# Patient Record
Sex: Male | Born: 1940 | Race: White | Hispanic: No | Marital: Married | Smoking: Former smoker
Health system: Southern US, Community
[De-identification: ages and names within clinical notes are randomized; demographics above are authoritative.]

## PROBLEM LIST (undated history)

## (undated) DIAGNOSIS — C3492 Malignant neoplasm of unspecified part of left bronchus or lung: Secondary | ICD-10-CM

## (undated) DIAGNOSIS — I499 Cardiac arrhythmia, unspecified: Secondary | ICD-10-CM

## (undated) DIAGNOSIS — E039 Hypothyroidism, unspecified: Secondary | ICD-10-CM

## (undated) DIAGNOSIS — F32A Depression, unspecified: Secondary | ICD-10-CM

## (undated) DIAGNOSIS — IMO0002 Reserved for concepts with insufficient information to code with codable children: Secondary | ICD-10-CM

## (undated) DIAGNOSIS — I10 Essential (primary) hypertension: Secondary | ICD-10-CM

## (undated) DIAGNOSIS — F329 Major depressive disorder, single episode, unspecified: Secondary | ICD-10-CM

## (undated) DIAGNOSIS — J90 Pleural effusion, not elsewhere classified: Secondary | ICD-10-CM

## (undated) DIAGNOSIS — F419 Anxiety disorder, unspecified: Secondary | ICD-10-CM

## (undated) HISTORY — DX: Anxiety disorder, unspecified: F41.9

## (undated) HISTORY — DX: Essential (primary) hypertension: I10

## (undated) HISTORY — DX: Malignant neoplasm of unspecified part of left bronchus or lung: C34.92

## (undated) HISTORY — PX: APPENDECTOMY: SHX54

---

## 2007-03-10 ENCOUNTER — Ambulatory Visit: Payer: Self-pay | Admitting: Internal Medicine

## 2007-03-10 DIAGNOSIS — R21 Rash and other nonspecific skin eruption: Secondary | ICD-10-CM

## 2007-03-10 DIAGNOSIS — F329 Major depressive disorder, single episode, unspecified: Secondary | ICD-10-CM

## 2007-03-10 DIAGNOSIS — G4700 Insomnia, unspecified: Secondary | ICD-10-CM

## 2007-05-06 ENCOUNTER — Ambulatory Visit: Payer: Self-pay | Admitting: Internal Medicine

## 2007-05-06 LAB — CONVERTED CEMR LAB
Basophils Absolute: 0.1 10*3/uL (ref 0.0–0.1)
Basophils Relative: 1.6 % — ABNORMAL HIGH (ref 0.0–1.0)
CO2: 29 meq/L (ref 19–32)
Chloride: 104 meq/L (ref 96–112)
Creatinine, Ser: 0.9 mg/dL (ref 0.4–1.5)
Eosinophils Absolute: 0.3 10*3/uL (ref 0.0–0.7)
Eosinophils Relative: 4 % (ref 0.0–5.0)
GFR calc non Af Amer: 90 mL/min
Lymphocytes Relative: 37.3 % (ref 12.0–46.0)
Neutrophils Relative %: 48.3 % (ref 43.0–77.0)
Platelets: 216 10*3/uL (ref 150–400)
Potassium: 4 meq/L (ref 3.5–5.1)
RBC: 4.76 M/uL (ref 4.22–5.81)
TSH: 1.87 microintl units/mL (ref 0.35–5.50)
WBC: 8 10*3/uL (ref 4.5–10.5)

## 2007-05-07 ENCOUNTER — Encounter: Payer: Self-pay | Admitting: Internal Medicine

## 2008-02-01 ENCOUNTER — Telehealth: Payer: Self-pay | Admitting: Internal Medicine

## 2010-03-15 ENCOUNTER — Telehealth: Payer: Self-pay | Admitting: Internal Medicine

## 2010-03-16 ENCOUNTER — Encounter: Payer: Self-pay | Admitting: Internal Medicine

## 2010-03-20 NOTE — Progress Notes (Signed)
Summary: Last ov 2009  Phone Note Refill Request   Refills Requested: Medication #1:  FLUOXETINE HCL 10 MG  CAPS Take 1 capsule by mouth daily REQ RF in [place] - [phone] Fax Req refill for 90 day supply w/3 rfs  Initial call taken by: Lamar Sprinkles, CMA,  March 15, 2010 2:18 PM  Follow-up for Phone Call        OK x6 months  Say Hello and ask him to start seeing a local md Thank you!  Follow-up by: Tresa Garter MD,  March 15, 2010 3:46 PM  Additional Follow-up for Phone Call Additional follow up Details #1::        faxed prescription for pt and informed pt Additional Follow-up by: Ami Bullins CMA,  March 16, 2010 8:27 AM    Prescriptions: FLUOXETINE HCL 10 MG  CAPS (FLUOXETINE HCL) Take 1 capsule by mouth daily  #90 x 3   Entered by:   Ami Bullins CMA   Authorized by:   Tresa Garter MD   Signed by:   Bill Salinas CMA on 03/16/2010   Method used:   Printed then faxed to ...         RxID:   [id number]

## 2010-03-29 NOTE — Medication Information (Signed)
Summary: Faxed to [facility] [place]  [place]axed to [place]   Imported By: Lester [place] 03/19/2010 08:30:27  _____________________________________________________________________  External Attachment:    Type:   Image     Comment:   External Document

## 2011-07-04 ENCOUNTER — Other Ambulatory Visit: Payer: Self-pay | Admitting: Internal Medicine

## 2011-10-25 ENCOUNTER — Other Ambulatory Visit: Payer: Self-pay | Admitting: Internal Medicine

## 2011-10-28 NOTE — Telephone Encounter (Signed)
Ok to Rf? 

## 2013-06-17 ENCOUNTER — Telehealth: Payer: Self-pay | Admitting: Internal Medicine

## 2013-06-17 NOTE — Telephone Encounter (Signed)
Message copied by Derrill Kay on Thu Jun 17, 2013  2:47 PM ------      Message from: Cassandria Anger      Created: Wed Jun 16, 2013  5:25 PM      Regarding: RE: Can patient Re-Establish Care      Contact: [place] to re-est      Thx      ----- Message -----         From: Derrill Kay         Sent: 06/16/2013   3:36 PM           To: Cassandria Anger, MD      Subject: Can patient Re-Establish Care                            Dr. Alain Marion,            Vicente Males is calling in regards to your patient who was last seen 04/2007, Mr. Elishah Ashmore. Vicente Males is his daughter.  Mr. Cropp moved to [place] for a few years and is now back in the area.              Vicente Males wants to know if Mr. Vanatta can re-establish with you although it has been more than 3 years since he was last seen.  Can he re-establish care?        Vicente Males asked me to mention to you that the patient is the father of Dr. [name]. I told Vicente Males that I would call her back after asking your permission for him to re-establish care.            Thanks,      Dimitri Ped.       ------

## 2013-06-17 NOTE — Telephone Encounter (Signed)
Called Anna(pt's daughter) to schedule. LVM for call back.

## 2013-06-18 NOTE — Telephone Encounter (Signed)
Pt schedule an appt 07/14/13.

## 2013-07-14 ENCOUNTER — Telehealth: Payer: Self-pay | Admitting: Internal Medicine

## 2013-07-14 ENCOUNTER — Encounter: Payer: Self-pay | Admitting: Internal Medicine

## 2013-07-14 ENCOUNTER — Ambulatory Visit (INDEPENDENT_AMBULATORY_CARE_PROVIDER_SITE_OTHER): Payer: BC Managed Care – PPO | Admitting: Internal Medicine

## 2013-07-14 VITALS — BP 140/96 | HR 88 | Temp 98.5°F | Resp 16 | Ht 67.5 in | Wt 196.0 lb

## 2013-07-14 DIAGNOSIS — R0989 Other specified symptoms and signs involving the circulatory and respiratory systems: Secondary | ICD-10-CM

## 2013-07-14 DIAGNOSIS — R5383 Other fatigue: Secondary | ICD-10-CM | POA: Insufficient documentation

## 2013-07-14 DIAGNOSIS — F3289 Other specified depressive episodes: Secondary | ICD-10-CM

## 2013-07-14 DIAGNOSIS — R0609 Other forms of dyspnea: Secondary | ICD-10-CM

## 2013-07-14 DIAGNOSIS — I10 Essential (primary) hypertension: Secondary | ICD-10-CM | POA: Insufficient documentation

## 2013-07-14 DIAGNOSIS — F329 Major depressive disorder, single episode, unspecified: Secondary | ICD-10-CM

## 2013-07-14 DIAGNOSIS — R0600 Dyspnea, unspecified: Secondary | ICD-10-CM | POA: Insufficient documentation

## 2013-07-14 DIAGNOSIS — S60221A Contusion of right hand, initial encounter: Secondary | ICD-10-CM

## 2013-07-14 DIAGNOSIS — S60229A Contusion of unspecified hand, initial encounter: Secondary | ICD-10-CM

## 2013-07-14 MED ORDER — FLUOXETINE HCL 10 MG PO CAPS: ORAL_CAPSULE | ORAL | Status: DC

## 2013-07-14 MED ORDER — TRIAMTERENE-HCTZ 37.5-25 MG PO TABS: 1.0000 | ORAL_TABLET | Freq: Every day | ORAL | Status: DC

## 2013-07-14 MED ORDER — ASPIRIN EC 81 MG PO TBEC: 81.0000 mg | DELAYED_RELEASE_TABLET | Freq: Every day | ORAL | Status: DC

## 2013-07-14 MED ORDER — VITAMIN D 1000 UNITS PO TABS: 1000.0000 [IU] | ORAL_TABLET | Freq: Every day | ORAL | Status: DC

## 2013-07-14 NOTE — Assessment & Plan Note (Signed)
Re-start Prozac

## 2013-07-14 NOTE — Assessment & Plan Note (Signed)
CXR EKG

## 2013-07-14 NOTE — Telephone Encounter (Signed)
Relevant patient education assigned to patient using Emmi. ° °

## 2013-07-14 NOTE — Progress Notes (Deleted)
Pre visit review using our clinic review tool, if applicable. No additional management support is needed unless otherwise documented below in the visit note. 

## 2013-07-14 NOTE — Assessment & Plan Note (Signed)
Cont to work w/a ball

## 2013-07-14 NOTE — Progress Notes (Signed)
   Subjective:    HPI  Not seen in >3 years - new pt  C/o HTN, SOB w/exertion - chronic  C/o mood issues, irritable  C/o R wrist pain and R 5th finger wouldn't bend  - fell 4 wks ago  He is hard hearing    Review of Systems  Constitutional: Positive for fatigue. Negative for appetite change and unexpected weight change.  HENT: Positive for hearing loss and sinus pressure. Negative for congestion, nosebleeds, sneezing, sore throat and trouble swallowing.   Eyes: Negative for itching and visual disturbance.  Respiratory: Positive for shortness of breath (mild). Negative for cough.   Cardiovascular: Negative for chest pain, palpitations and leg swelling.  Gastrointestinal: Negative for nausea, vomiting, diarrhea, blood in stool and abdominal distention.  Genitourinary: Negative for frequency and hematuria.  Musculoskeletal: Positive for arthralgias. Negative for back pain, gait problem, joint swelling and neck pain.  Skin: Negative for rash.  Neurological: Negative for dizziness, tremors, speech difficulty, weakness and light-headedness.  Psychiatric/Behavioral: Positive for dysphoric mood. Negative for suicidal ideas, sleep disturbance, decreased concentration and agitation. The patient is not nervous/anxious.        Objective:   Physical Exam  Constitutional: He is oriented to person, place, and time. He appears well-developed. No distress.  NAD  HENT:  Mouth/Throat: Oropharynx is clear and moist.  B wax   Eyes: Conjunctivae are normal. Pupils are equal, round, and reactive to light.  Neck: Normal range of motion. No JVD present. No thyromegaly present.  Cardiovascular: Normal rate, regular rhythm, normal heart sounds and intact distal pulses.  Exam reveals no gallop and no friction rub.   No murmur heard. Pulmonary/Chest: Effort normal and breath sounds normal. No respiratory distress. He has no wheezes. He has no rales. He exhibits no tenderness.  Abdominal: Soft. Bowel  sounds are normal. He exhibits no distension and no mass. There is no tenderness. There is no rebound and no guarding.  Musculoskeletal: Normal range of motion. He exhibits no edema and no tenderness.  Lymphadenopathy:    He has no cervical adenopathy.  Neurological: He is alert and oriented to person, place, and time. He has normal reflexes. No cranial nerve deficit. He exhibits normal muscle tone. He displays a negative Romberg sign. Coordination and gait normal.  No meningeal signs  Skin: Skin is warm and dry. No rash noted.  Psychiatric: He has a normal mood and affect. His behavior is normal. Judgment and thought content normal.  R MCP 4-5 is tender, can't flex all the way        Assessment & Plan:

## 2013-07-14 NOTE — Assessment & Plan Note (Signed)
Labs CXR EKG

## 2013-07-15 ENCOUNTER — Other Ambulatory Visit (INDEPENDENT_AMBULATORY_CARE_PROVIDER_SITE_OTHER): Payer: Self-pay

## 2013-07-15 DIAGNOSIS — I10 Essential (primary) hypertension: Secondary | ICD-10-CM

## 2013-07-15 DIAGNOSIS — R0989 Other specified symptoms and signs involving the circulatory and respiratory systems: Secondary | ICD-10-CM

## 2013-07-15 DIAGNOSIS — F329 Major depressive disorder, single episode, unspecified: Secondary | ICD-10-CM

## 2013-07-15 DIAGNOSIS — S60221A Contusion of right hand, initial encounter: Secondary | ICD-10-CM

## 2013-07-15 DIAGNOSIS — R0609 Other forms of dyspnea: Secondary | ICD-10-CM

## 2013-07-15 DIAGNOSIS — F3289 Other specified depressive episodes: Secondary | ICD-10-CM

## 2013-07-15 DIAGNOSIS — R0600 Dyspnea, unspecified: Secondary | ICD-10-CM

## 2013-07-15 DIAGNOSIS — S60229A Contusion of unspecified hand, initial encounter: Secondary | ICD-10-CM

## 2013-07-15 LAB — COMPREHENSIVE METABOLIC PANEL
ALK PHOS: 55 U/L (ref 39–117)
ALT: 22 U/L (ref 0–53)
AST: 18 U/L (ref 0–37)
Albumin: 4.6 g/dL (ref 3.5–5.2)
BILIRUBIN TOTAL: 1.1 mg/dL (ref 0.2–1.2)
BUN: 14 mg/dL (ref 6–23)
CO2: 28 mEq/L (ref 19–32)
Calcium: 10.1 mg/dL (ref 8.4–10.5)
Chloride: 105 mEq/L (ref 96–112)
Creatinine, Ser: 1.1 mg/dL (ref 0.4–1.5)
GFR: 69.26 mL/min (ref >60.00)
GLUCOSE: 92 mg/dL (ref 70–99)
Potassium: 4.8 mEq/L (ref 3.5–5.1)
SODIUM: 140 meq/L (ref 135–145)
TOTAL PROTEIN: 8 g/dL (ref 6.0–8.3)

## 2013-07-15 LAB — URINALYSIS
Bilirubin Urine: NEGATIVE
HGB URINE DIPSTICK: NEGATIVE
KETONES UR: NEGATIVE
Leukocytes, UA: NEGATIVE
Nitrite: NEGATIVE
Specific Gravity, Urine: <=1.005 — AB (ref 1.000–1.030)
Total Protein, Urine: NEGATIVE
URINE GLUCOSE: NEGATIVE
UROBILINOGEN UA: 0.2 (ref 0.0–1.0)
pH: 6 (ref 5.0–8.0)

## 2013-07-15 LAB — CBC WITH DIFFERENTIAL/PLATELET
Basophils Absolute: 0.1 10*3/uL (ref 0.0–0.1)
Basophils Relative: 0.6 % (ref 0.0–3.0)
EOS PCT: 3.7 % (ref 0.0–5.0)
Eosinophils Absolute: 0.3 10*3/uL (ref 0.0–0.7)
HEMATOCRIT: 47 % (ref 39.0–52.0)
Hemoglobin: 15.9 g/dL (ref 13.0–17.0)
LYMPHS ABS: 2.8 10*3/uL (ref 0.7–4.0)
Lymphocytes Relative: 31.5 % (ref 12.0–46.0)
MCHC: 33.8 g/dL (ref 30.0–36.0)
MCV: 92.4 fl (ref 78.0–100.0)
Monocytes Absolute: 0.7 10*3/uL (ref 0.1–1.0)
Monocytes Relative: 7.6 % (ref 3.0–12.0)
Neutro Abs: 5.1 10*3/uL (ref 1.4–7.7)
Neutrophils Relative %: 56.6 % (ref 43.0–77.0)
PLATELETS: 225 10*3/uL (ref 150.0–400.0)
RBC: 5.08 Mil/uL (ref 4.22–5.81)
RDW: 13.9 % (ref 11.5–15.5)
WBC: 9 10*3/uL (ref 4.0–10.5)

## 2013-07-15 LAB — TSH: TSH: 3.12 u[IU]/mL (ref 0.35–4.50)

## 2013-07-15 LAB — VITAMIN B12: Vitamin B-12: 294 pg/mL (ref 211–911)

## 2013-07-15 LAB — PSA: PSA: 5.69 ng/mL — ABNORMAL HIGH (ref 0.10–4.00)

## 2013-07-27 ENCOUNTER — Ambulatory Visit (INDEPENDENT_AMBULATORY_CARE_PROVIDER_SITE_OTHER)
Admission: RE | Admit: 2013-07-27 | Discharge: 2013-07-27 | Disposition: A | Discharge status: Home / Self Care | Payer: Self-pay | Source: Ambulatory Visit | Attending: Internal Medicine | Admitting: Internal Medicine

## 2013-07-27 ENCOUNTER — Telehealth: Payer: Self-pay | Admitting: *Deleted

## 2013-07-27 DIAGNOSIS — R0989 Other specified symptoms and signs involving the circulatory and respiratory systems: Secondary | ICD-10-CM

## 2013-07-27 DIAGNOSIS — R0609 Other forms of dyspnea: Secondary | ICD-10-CM

## 2013-07-27 NOTE — Telephone Encounter (Signed)
CALL REPORT CXR- Concern for mass in left lower lobe. Non-contrast chest ct is advise...Brandon Newton

## 2013-07-27 NOTE — Telephone Encounter (Signed)
I contacted his dtr who is a doctor: waiting on a reply

## 2013-07-28 ENCOUNTER — Telehealth: Payer: Self-pay | Admitting: Internal Medicine

## 2013-07-28 DIAGNOSIS — R9389 Abnormal findings on diagnostic imaging of other specified body structures: Secondary | ICD-10-CM

## 2013-07-28 NOTE — Telephone Encounter (Signed)
Will sch chest CT Thx

## 2013-08-21 DIAGNOSIS — C3492 Malignant neoplasm of unspecified part of left bronchus or lung: Secondary | ICD-10-CM

## 2013-08-21 HISTORY — DX: Malignant neoplasm of unspecified part of left bronchus or lung: C34.92

## 2013-08-23 ENCOUNTER — Ambulatory Visit (INDEPENDENT_AMBULATORY_CARE_PROVIDER_SITE_OTHER)
Admission: RE | Admit: 2013-08-23 | Discharge: 2013-08-23 | Disposition: A | Discharge status: Home / Self Care | Payer: BC Managed Care – PPO | Source: Ambulatory Visit | Attending: Internal Medicine | Admitting: Internal Medicine

## 2013-08-23 DIAGNOSIS — R9389 Abnormal findings on diagnostic imaging of other specified body structures: Secondary | ICD-10-CM

## 2013-08-24 ENCOUNTER — Other Ambulatory Visit: Payer: Self-pay | Admitting: Internal Medicine

## 2013-08-24 DIAGNOSIS — R911 Solitary pulmonary nodule: Secondary | ICD-10-CM

## 2013-08-26 ENCOUNTER — Other Ambulatory Visit (INDEPENDENT_AMBULATORY_CARE_PROVIDER_SITE_OTHER): Payer: BC Managed Care – PPO

## 2013-08-26 ENCOUNTER — Ambulatory Visit (INDEPENDENT_AMBULATORY_CARE_PROVIDER_SITE_OTHER): Payer: BC Managed Care – PPO | Admitting: Internal Medicine

## 2013-08-26 ENCOUNTER — Encounter: Payer: Self-pay | Admitting: Internal Medicine

## 2013-08-26 VITALS — BP 130/76 | HR 71 | Temp 99.1°F | Ht 67.0 in | Wt 187.0 lb

## 2013-08-26 DIAGNOSIS — R0989 Other specified symptoms and signs involving the circulatory and respiratory systems: Secondary | ICD-10-CM

## 2013-08-26 DIAGNOSIS — R911 Solitary pulmonary nodule: Secondary | ICD-10-CM

## 2013-08-26 DIAGNOSIS — R0609 Other forms of dyspnea: Secondary | ICD-10-CM

## 2013-08-26 DIAGNOSIS — R0600 Dyspnea, unspecified: Secondary | ICD-10-CM

## 2013-08-26 DIAGNOSIS — C3490 Malignant neoplasm of unspecified part of unspecified bronchus or lung: Secondary | ICD-10-CM | POA: Insufficient documentation

## 2013-08-26 LAB — BRAIN NATRIURETIC PEPTIDE: Pro B Natriuretic peptide (BNP): 40 pg/mL (ref 0.0–100.0)

## 2013-08-26 LAB — SEDIMENTATION RATE: Sed Rate: 38 mm/hr — ABNORMAL HIGH (ref 0–22)

## 2013-08-26 NOTE — Progress Notes (Signed)
Quick Note:  Spoke with pt and notified of results per Dr. Wert. Pt verbalized understanding and denied any questions.  ______ 

## 2013-08-26 NOTE — Progress Notes (Signed)
   Subjective:    Patient ID: Brandon Newton, male    DOB: [birth year]   MRN: [id number]  HPI  63 yow Russian quit smoking  2000 referred by Plotnikov for eval of sob and fatigue x winter 2015 referred by Dr Alain Marion  To pulmonary clinic 08/26/2013 with abn CT chest done  08/23/13     08/26/2013 1st [facility] office visit/ Melvyn Novas / daughter is anesthesiologist in [facility] Complaint  Patient presents with  . Pulmonary Consult    Referred by Dr Alain Marion for eval of pulmonary nodule. Pt c/o SOB for the past 4-5 months- progressively worsening.   indolent onset progressive doe x 89m x flight of steps and can no longer push mow vs fall of 2014 s stopping to rest.  Has bad teeth but no recent dental work/ procedures/ pain or risk of aspiration or cough/congestion or h/o hemoptysis  No obvious other patterns in day to day or daytime variabilty or assoc ex or pleuritic cp or chest tightness, subjective wheeze overt sinus or hb symptoms. No unusual exp hx or h/o childhood pna/ asthma or knowledge of premature birth.  Sleeping ok without nocturnal  or early am exacerbation  of respiratory  c/o's or need for noct saba. Also denies any obvious fluctuation of symptoms with weather or environmental changes or other aggravating or alleviating factors except as outlined above   Current Medications, Allergies, Complete Past Medical History, Past Surgical History, Family History, and Social History were reviewed in [employer] record.            Review of Systems  Constitutional: Negative for fever, chills, activity change, appetite change and unexpected weight change.  HENT: Positive for dental problem. Negative for congestion, postnasal drip, rhinorrhea, sneezing, sore throat, trouble swallowing and voice change.   Eyes: Negative for visual disturbance.  Respiratory: Positive for shortness of breath. Negative for cough and choking.   Cardiovascular: Negative for chest  pain and leg swelling.  Gastrointestinal: Negative for nausea, vomiting and abdominal pain.  Genitourinary: Negative for difficulty urinating.  Musculoskeletal: Negative for arthralgias.  Skin: Negative for rash.  Psychiatric/Behavioral: Negative for behavioral problems and confusion.       Objective:   Physical Exam  amb wm nad   Wt Readings from Last 3 Encounters:  08/26/13 187 lb (84.823 kg)  07/14/13 196 lb (88.905 kg)  05/06/07 185 lb (83.915 kg)      HEENT: nl dentition, turbinates, and orophanx. Nl external ear canals without cough reflex   NECK :  without JVD/Nodes/TM/ nl carotid upstrokes bilaterally   LUNGS: no acc muscle use, clear to A and P bilaterally without cough on insp or exp maneuvers   CV:  RRR  no s3 or murmur or increase in P2, no edema   ABD:  soft and nontender with nl excursion in the supine position. No bruits or organomegaly, bowel sounds nl  MS:  warm without deformities, calf tenderness, cyanosis or clubbing  SKIN: warm and dry without lesions    NEURO:  alert, approp, no deficits    CT Chest 08/23/13  There is a 2.8 x 2.1 cm left lower lobe subpleural pulmonary  nodule. Differential diagnosis includes round pneumonia versus  malignancy. Recommend follow-up CT in 3 months or PET/CT or Biopsy  for further evaluation.      Assessment & Plan:

## 2013-08-26 NOTE — Patient Instructions (Signed)
Please see patient coordinator before you leave today  to schedule PET scan and then we will schedule you for a CT directed biopsy if possible  (would need to be off aspirin x 72 hours for this)  Please remember to go to the lab   department downstairs for your tests - we will call you with the results when they are available.

## 2013-08-27 NOTE — Assessment & Plan Note (Signed)
Onset winter 2015 - spirometry non physiologic  - 08/26/2013  Walked RA x 3 laps @ 185 ft each stopped due to end of study, moderate pace, no sob or desat   Symptoms are markedly disproportionate to objective findings and not clear this is a lung problem but pt does appear to have difficult airway management issues.   Lab Results  Component Value Date   PROBNP 40.0 08/26/2013     Not convinced he has a heart or pulmonary problem explaining his doe at this point.  May need cpst if complaint persists prior to considering any form of lung resection.

## 2013-08-27 NOTE — Assessment & Plan Note (Addendum)
Discussed in detail all the  indications, usual  risks and alternatives  relative to the benefits with patient who agrees to proceed with PET then consider ? Segmentectomy vs IR Bx vs watch over time ? Could this be an area of asp related to poor dentition?

## 2013-08-31 ENCOUNTER — Ambulatory Visit (HOSPITAL_COMMUNITY)
Admission: RE | Admit: 2013-08-31 | Discharge: 2013-08-31 | Disposition: A | Discharge status: Home / Self Care | Payer: BC Managed Care – PPO | Source: Ambulatory Visit | Attending: Diagnostic Radiology | Admitting: Diagnostic Radiology

## 2013-08-31 ENCOUNTER — Encounter (HOSPITAL_COMMUNITY): Payer: Self-pay

## 2013-08-31 ENCOUNTER — Encounter: Payer: Self-pay | Admitting: Internal Medicine

## 2013-08-31 DIAGNOSIS — R911 Solitary pulmonary nodule: Secondary | ICD-10-CM | POA: Insufficient documentation

## 2013-08-31 LAB — GLUCOSE, CAPILLARY: Glucose-Capillary: 96 mg/dL (ref 70–99)

## 2013-08-31 MED ORDER — FLUDEOXYGLUCOSE F - 18 (FDG) INJECTION
8.7000 | Freq: Once | INTRAVENOUS | Status: AC | PRN
  Administered 2013-08-31: 8.7 via INTRAVENOUS

## 2013-09-01 ENCOUNTER — Other Ambulatory Visit: Payer: Self-pay | Admitting: Internal Medicine

## 2013-09-01 DIAGNOSIS — R911 Solitary pulmonary nodule: Secondary | ICD-10-CM

## 2013-09-01 NOTE — Progress Notes (Signed)
Quick Note:  Referral sent to [facility] ______

## 2013-09-02 ENCOUNTER — Telehealth: Payer: Self-pay | Admitting: Internal Medicine

## 2013-09-02 ENCOUNTER — Other Ambulatory Visit: Payer: Self-pay | Admitting: Internal Medicine

## 2013-09-02 MED ORDER — ESCITALOPRAM OXALATE 10 MG PO TABS: 10.0000 mg | ORAL_TABLET | Freq: Every day | ORAL | Status: DC

## 2013-09-02 MED ORDER — TRIAMTERENE-HCTZ 37.5-25 MG PO TABS: 1.0000 | ORAL_TABLET | Freq: Every day | ORAL | Status: DC

## 2013-09-02 NOTE — Telephone Encounter (Signed)
Per notes in chart, records have been sent to Dr Hendrickson's office Referral Notes     Type Date User    General 09/01/2013 2:59 PM LAW, DAWNE J         Note    Staff message to Jenny Reichmann @[facility]  Dawne J Law           Note Please refer to Dr Roxan Hockey for eval of possible left lower lobectomy  Pt daughter [name] aware that records faxed and that someone should be contacting them to schedule soon.  [name] states that she is in a time crunch d/t her living in [place] and having to go back to work by Sept 1. Needing appt next week if possible.  Number given for Dr Hendrickson's office per [facility] request as she wants to call and speak with someone there to explain.  Nothing further needed.

## 2013-09-03 ENCOUNTER — Other Ambulatory Visit: Payer: Self-pay | Admitting: Internal Medicine

## 2013-09-03 ENCOUNTER — Encounter: Payer: Self-pay | Admitting: Internal Medicine

## 2013-09-03 ENCOUNTER — Institutional Professional Consult (permissible substitution) (INDEPENDENT_AMBULATORY_CARE_PROVIDER_SITE_OTHER): Payer: BC Managed Care – PPO | Admitting: Thoracic Surgery (Cardiothoracic Vascular Surgery)

## 2013-09-03 VITALS — BP 133/93 | HR 104 | Resp 16 | Ht 67.0 in | Wt 186.0 lb

## 2013-09-03 DIAGNOSIS — J984 Other disorders of lung: Secondary | ICD-10-CM

## 2013-09-03 DIAGNOSIS — R918 Other nonspecific abnormal finding of lung field: Secondary | ICD-10-CM

## 2013-09-03 DIAGNOSIS — R0609 Other forms of dyspnea: Secondary | ICD-10-CM

## 2013-09-03 DIAGNOSIS — J438 Other emphysema: Secondary | ICD-10-CM

## 2013-09-03 DIAGNOSIS — J439 Emphysema, unspecified: Secondary | ICD-10-CM

## 2013-09-03 DIAGNOSIS — R0789 Other chest pain: Secondary | ICD-10-CM

## 2013-09-03 DIAGNOSIS — I700 Atherosclerosis of aorta: Secondary | ICD-10-CM | POA: Insufficient documentation

## 2013-09-03 NOTE — Progress Notes (Signed)
PCP is [name], MD Referring Provider is Tanda Rockers, MD  Chief Complaint  Patient presents with  . Lung Lesion    RLLobe...CT CHEST 08/23/13.Marland KitchenPET 08/31/13....RLLobe lung nodule suggested to be followed    HPI: 73 year old man sent for consultation regarding a left lower lobe mass.  Brandon Newton is a 73 year old gentleman originally from [place]. He has a remote history of tobacco abuse smoking about one half a pack a day for 30 years prior to quitting 15 years ago. He saw Dr. Alain Marion in June for the first time in several years. His daughter had noticed that he was less active and more short of breath. The patient denies any significant shortness of breath, but his daughter thought he was very tired when mowing his yard, which is about an acre. He previously to do that without issue. A chest x-ray was done which showed a concern for a 3 x 2 cm left lower lobe mass. A CT of the chest subsequently confirmed a 2.8 x 2.1 cm left lower lobe mass. He saw Dr. Melvyn Novas.  A PET CT was done and the mass was hypermetabolic with an SUV of 43.1. There is no suspicious hilar or mediastinal adenopathy.  Patient has lost 4 pounds over the past 3 months. He denies cough or hemoptysis. He can walk up a flight of stairs without hesitation or shortness of breath. He denies any chest pain, pressure, or tightness. He is no history of coronary disease but did have coronary calcification on his CT scan. He also was recently diagnosed with hypertension. His father had lung cancer.  ECOG/ZUBROD=0     Past Medical History  Diagnosis Date  . Anxiety   . Hypertension   . Cancer     skin    Past Surgical History  Procedure Laterality Date  . Appendectomy      Family History  Problem Relation Age of Onset  . Lung cancer Father 25    smoked  . Stomach cancer Mother     Social History History  Substance Use Topics  . Smoking status: Former Smoker -- 0.50 packs/day for 32 years    Types:  Cigarettes    Quit date: 01/21/1998  . Smokeless tobacco: Never Used  . Alcohol Use: No    Current Outpatient Prescriptions  Medication Sig Dispense Refill  . aspirin EC 81 MG tablet Take 1 tablet (81 mg total) by mouth daily.  100 tablet  3  . cholecalciferol (VITAMIN D) 1000 UNITS tablet Take 1 tablet (1,000 Units total) by mouth daily.  100 tablet  3  . cyanocobalamin 500 MCG tablet Take 500 mcg by mouth daily.      Marland Kitchen triamterene-hydrochlorothiazide (MAXZIDE-25) 37.5-25 MG per tablet Take 1 tablet by mouth daily. qam  30 tablet  11  . escitalopram (LEXAPRO) 10 MG tablet Take 1 tablet (10 mg total) by mouth daily.  30 tablet  5   No current facility-administered medications for this visit.    No Known Allergies  Review of Systems  Constitutional: Positive for fatigue (patient denies but per daughter not as active ) and unexpected weight change (lost 4 pounds in 3 months). Negative for fever and chills.  HENT: Positive for hearing loss.   Respiratory: Positive for shortness of breath (with heavy exertion). Negative for chest tightness and wheezing.   Cardiovascular: Negative for chest pain and leg swelling.  Hematological: Does not bruise/bleed easily.  Psychiatric/Behavioral: Positive for dysphoric mood.  All other systems reviewed and are  negative.   BP 133/93  Pulse 104  Resp 16  Ht 5\' 7"  (1.702 m)  Wt 186 lb (84.369 kg)  BMI 29.12 kg/m2  SpO2 97% Physical Exam  Vitals reviewed. Constitutional: He is oriented to person, place, and time. He appears well-developed and well-nourished. No distress.  HENT:  Head: Normocephalic and atraumatic.  Hard of hearing  Eyes: EOM are normal. Pupils are equal, round, and reactive to light.  + glasses  Neck: Neck supple. No thyromegaly present.  Cardiovascular: Normal rate, regular rhythm, normal heart sounds and intact distal pulses.  Exam reveals no gallop.   No murmur heard. Pulmonary/Chest: Effort normal and breath sounds  normal. He has no wheezes. He has no rales.  Abdominal: Soft. There is no tenderness.  Musculoskeletal: He exhibits no edema.  Lymphadenopathy:    He has no cervical adenopathy.  Neurological: He is alert and oriented to person, place, and time. No cranial nerve deficit.  Skin: Skin is warm and dry.     Diagnostic Tests: CT CHEST WITHOUT CONTRAST  TECHNIQUE:  Multidetector CT imaging of the chest was performed following the  standard protocol without IV contrast.  COMPARISON: None.  FINDINGS:  The central airways are patent. There is a subpleural left lower  lobe pulmonary nodule measuring 2.8 x 2.1 cm. There is hazy right  lower lobe airspace disease dependently likely reflecting  atelectasis. There is a smoothly marginated ellipsoid 9 mm nodule  along the upper right major fissure likely representing an  intrapulmonary lymph node. There is a similar 7 mm smoothly  marginated nodule along the left major fissure. There is no focal  consolidation. There is no pleural effusion or pneumothorax.  There are no pathologically enlarged axillary, hilar or mediastinal  lymph nodes.  The heart size is normal. There is no pericardial effusion. The  thoracic aorta is normal in caliber. There is coronary artery  atherosclerosis in the LAD, circumflex and RCA.  Review of bone windows demonstrates no focal lytic or sclerotic  lesions.  Limited non-contrast images of the upper abdomen were obtained. The  adrenal glands appear normal. The remainder of the visualized  abdominal organs are unremarkable.  IMPRESSION:  1. There is a 2.8 x 2.1 cm left lower lobe subpleural pulmonary  nodule. Differential diagnosis includes round pneumonia versus  malignancy. Recommend follow-up CT in 3 months or PET/CT or Biopsy  for further evaluation. This recommendation follows the consensus  statement: "Guidelines for Management of Small Pulmonary Nodules  Detected on CT Scans: A Statement from the [employer]" as  published in Radiology 2005; 237:395-400. Thoracic surgery  consultation may be helpful.  2. Coronary artery disease.  Electronically Signed  By: Kathreen Devoid  On: 08/23/2013 09:46   NUCLEAR MEDICINE PET SKULL BASE TO THIGH  TECHNIQUE:  8.7 mCi F-18 FDG was injected intravenously. Full-ring PET imaging  was performed from the skull base to thigh after the radiotracer. CT  data was obtained and used for attenuation correction and anatomic  localization.  FASTING BLOOD GLUCOSE: Value: 96 mg/dl  COMPARISON: Radiographs 07/27/2013. Chest CT 08/23/2013.  FINDINGS:  NECK  No hypermetabolic cervical lymph nodes are identified.There are no  lesions of the pharyngeal mucosal space. There is minimally  asymmetric activity associated with the muscles of phonation.  CHEST  There are no hypermetabolic mediastinal, hilar or axillary lymph  nodes. The dominant subpleural nodule in the left lower lobe  measures 2.8 x 1.8 cm on image 45 and is hypermetabolic with  an SUV  max of 13.9. There is an area of less well-defined hypermetabolic  activity more superiorly in the subpleural portion of the right  lower lobe. This corresponds with an ill-defined 11 mm density on  image 39 and has an SUV max of 3.3. There is no other abnormal  pulmonary metabolic activity. Specifically, the small area  subpleural nodularity along the major fissures bilaterally  demonstrate no abnormal metabolic activity. Diffuse atherosclerosis  again noted.  ABDOMEN/PELVIS  There is no hypermetabolic activity within the liver, adrenal  glands, spleen or pancreas. There is no hypermetabolic nodal  activity. A small central hepatic cyst, diffuse atherosclerosis,  renal cortical and parapelvic cysts, and mild enlargement of the  prostate gland are noted.  SKELETON  There is no hypermetabolic activity to suggest osseous metastatic  disease.  IMPRESSION:  1. The dominant subpleural left lower lobe pulmonary nodule  is  hypermetabolic and suspicious for bronchogenic malignancy. Tissue  sampling of this nodule is recommended.  2. Indeterminate less well-defined area of low-level upper metabolic  activity in the subpleural right lower lobe. 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 CT follow-up of this finding in 6 months recommended to  evaluate stability.  3. No evidence of metastatic disease.  Electronically Signed  By: Camie Patience M.D.  On: 08/31/2013 09:06   Pulmonary function testing FVC 3.14 (70%) FEV1 1.12 (36%) "Best" FEV1 1.57 (50%) FEV1 FEC 36% Note: His daughter was present during his PFTs and she felt there was miscommunication and he "paced himself" during exhalation  Impression: 73 year old man with a remote history of moderate tobacco use and newly diagnosed COPD who has a 2.8 x 2.1 cm hypermetabolic mass in the left lower lobe. Of note he also has a family history of lung cancer, with his father having had that disease. His pulmonary function testing appears borderline but not prohibitive. Based on his description of activities I suspect that his actual pulmonary function is better than the PFTs would indicate, more along the lines of his "best" FEV1 rather than the average.  I discussed our options with Brandon Newton and his daughter. One option would be to do a CT guided needle biopsy. One problem with that approach is that if the biopsy was negative it would be hard to trust that information. Another option would be to proceed to left VATS and wedge resection, with possible segmentectomy or lobectomy depending on intraoperative frozen section results. The disadvantage says it is a surgical procedure under general anesthesia. The advantage is that it would be definitive diagnostically and potentially therapeutically as well.  I discussed the proposed operation with Brandon Newton and his daughter. We reviewed the indications,  risks, benefits, and alternatives. They understand the general nature of the procedure, including the need for general anesthesia, incisions be used, the intraoperative decision making based on frozen section, the expected hospital stay, and overall recovery. They understand the risks include, but are not limited to death, MI, DVT, PE, stroke, bleeding, possible need for transfusion, infection, prolonged air leak, irregular heart rhythms, as well as the possibility of unforeseeable complications.  He understands and accepts the risks and wishes to proceed with surgical resection.  He is scheduled to have a stress test on Monday. We will also need the results of that test before moving forward.  Plan: Left VATS, wedge resection left lower lobe mass, possible segmentectomy or lobectomy on Friday, August 21, pending clearance from stress  [place]

## 2013-09-06 ENCOUNTER — Other Ambulatory Visit: Payer: Self-pay

## 2013-09-06 DIAGNOSIS — R911 Solitary pulmonary nodule: Secondary | ICD-10-CM

## 2013-09-07 ENCOUNTER — Encounter (HOSPITAL_COMMUNITY): Payer: Self-pay | Admitting: Pharmacy Technician

## 2013-09-07 ENCOUNTER — Ambulatory Visit (HOSPITAL_COMMUNITY): Payer: BC Managed Care – PPO | Attending: Internal Medicine | Admitting: Radiology

## 2013-09-07 VITALS — BP 136/76 | Ht 67.0 in | Wt 183.0 lb

## 2013-09-07 DIAGNOSIS — I10 Essential (primary) hypertension: Secondary | ICD-10-CM | POA: Diagnosis not present

## 2013-09-07 DIAGNOSIS — R079 Chest pain, unspecified: Secondary | ICD-10-CM | POA: Diagnosis not present

## 2013-09-07 DIAGNOSIS — R0789 Other chest pain: Secondary | ICD-10-CM

## 2013-09-07 DIAGNOSIS — I2510 Atherosclerotic heart disease of native coronary artery without angina pectoris: Secondary | ICD-10-CM | POA: Diagnosis present

## 2013-09-07 DIAGNOSIS — R0609 Other forms of dyspnea: Secondary | ICD-10-CM | POA: Diagnosis not present

## 2013-09-07 DIAGNOSIS — Z87891 Personal history of nicotine dependence: Secondary | ICD-10-CM | POA: Insufficient documentation

## 2013-09-07 DIAGNOSIS — R0989 Other specified symptoms and signs involving the circulatory and respiratory systems: Secondary | ICD-10-CM | POA: Insufficient documentation

## 2013-09-07 DIAGNOSIS — R0602 Shortness of breath: Secondary | ICD-10-CM

## 2013-09-07 MED ORDER — TECHNETIUM TC 99M SESTAMIBI GENERIC - CARDIOLITE
10.0000 | Freq: Once | INTRAVENOUS | Status: AC | PRN
  Administered 2013-09-07: 10 via INTRAVENOUS

## 2013-09-07 MED ORDER — TECHNETIUM TC 99M SESTAMIBI GENERIC - CARDIOLITE
30.0000 | Freq: Once | INTRAVENOUS | Status: AC | PRN
  Administered 2013-09-07: 30 via INTRAVENOUS

## 2013-09-07 NOTE — Progress Notes (Signed)
[facility] [address] [place], [place] [address] [phone]    Cardiology Nuclear Med Study  Brandon Newton is a 73 y.o. male     MRN : [id number]     DOB: 12/05/1940  Procedure Date: 09/07/2013  Nuclear Med Background Indication for Stress Test:  Evaluation for Ischemia, 07-28-2013 CT Chest: Coronary Artery Atherosclerosis, and Surgical Clearance for Possible (L) Lower Lobectomy on 09-10-2013 by Dr.Steven Roxan Hockey History:  CAD Cardiac Risk Factors: History of Smoking and Hypertension  Symptoms:  Chest Pain and DOE   Nuclear Pre-Procedure Caffeine/Decaff Intake:  None> 12 hrs NPO After: 8:00pm   Lungs:  clear O2 Sat: 98% on room air. IV 0.9% NS with Angio Cath:  22g  IV Site: R Hand x 1, tolerated well IV Started by:  Irven Baltimore, RN  Chest Size (in):  44 Cup Size: n/a  Height: 5\' 7"  (1.702 m)  Weight:  183 lb (83.008 kg)  BMI:  Body mass index is 28.66 kg/(m^2). Tech Comments:  Daughter [place] interpreter for the patient.    Nuclear Med Study 1 or 2 day study: 1 day  Stress Test Type:  Stress  Reading MD: N/A  Order Authorizing Provider:  [name], MD  Resting Radionuclide: Technetium 45m Sestamibi  Resting Radionuclide Dose: 11.0 mCi   Stress Radionuclide:  Technetium 64m Sestamibi  Stress Radionuclide Dose: 33.0 mCi           Stress Protocol Rest HR: 64 Stress HR: 136  Rest BP: 136/76 Stress BP: 171/73  Exercise Time (min): 5:01 METS: 7.0   Predicted Max HR: 149 bpm % Max HR: 91.28 bpm Rate Pressure Product: 23256   Dose of Adenosine (mg):  n/a Dose of Lexiscan: n/a mg  Dose of Atropine (mg): n/a Dose of Dobutamine: n/a mcg/kg/min (at max HR)  Stress Test Technologist: Perrin Maltese, EMT-P  Nuclear Technologist:  Annye Rusk, CNMT     Rest Procedure:  Myocardial perfusion imaging was performed at rest 45 minutes following the intravenous administration of Technetium 18m Sestamibi. Rest ECG: NSR - Normal  EKG  Stress Procedure:  The patient exercised on the treadmill utilizing the Bruce Protocol for 5:01 minutes. The patient stopped due to fatigue, sob, and denied any chest pain.  Technetium 75m Sestamibi was injected at peak exercise and myocardial perfusion imaging was performed after a brief delay. Stress ECG: No significant change from baseline ECG  QPS Raw Data Images:  Normal; no motion artifact; normal heart/lung ratio. Stress Images:  Normal homogeneous uptake in all areas of the myocardium. Rest Images:  Normal homogeneous uptake in all areas of the myocardium. Subtraction (SDS):  No evidence of ischemia. Transient Ischemic Dilatation (Normal <1.22):  0.93 Lung/Heart Ratio (Normal <0.45):  0.31  Quantitative Gated Spect Images QGS EDV:  84 ml QGS ESV:  33 ml  Impression Exercise Capacity:  Fair exercise capacity. BP Response:  Normal blood pressure response. Clinical Symptoms:  No significant symptoms noted. ECG Impression:  No significant ST segment change suggestive of ischemia. Comparison with Prior Nuclear Study: No images to compare  Overall Impression:  Normal stress nuclear study.  LV Ejection Fraction: 61%.  LV Wall Motion:  NL LV Function; NL Wall Motion.   Thayer Headings, Brooke Bonito., MD, [facility] 09/07/2013, 5:12 PM [address]  [place] Pager [phone]

## 2013-09-08 ENCOUNTER — Encounter (HOSPITAL_COMMUNITY)
Admission: RE | Admit: 2013-09-08 | Discharge: 2013-09-08 | Disposition: A | Discharge status: Home / Self Care | Payer: BC Managed Care – PPO | Source: Ambulatory Visit | Attending: Thoracic Surgery (Cardiothoracic Vascular Surgery) | Admitting: Thoracic Surgery (Cardiothoracic Vascular Surgery)

## 2013-09-08 ENCOUNTER — Other Ambulatory Visit (HOSPITAL_COMMUNITY): Payer: Self-pay | Admitting: *Deleted

## 2013-09-08 ENCOUNTER — Encounter (HOSPITAL_COMMUNITY): Payer: Self-pay

## 2013-09-08 ENCOUNTER — Encounter (HOSPITAL_COMMUNITY): Payer: BC Managed Care – PPO

## 2013-09-08 VITALS — BP 155/94 | HR 76 | Temp 98.6°F | Resp 20 | Ht 67.0 in | Wt 186.3 lb

## 2013-09-08 DIAGNOSIS — R911 Solitary pulmonary nodule: Secondary | ICD-10-CM

## 2013-09-08 HISTORY — DX: Major depressive disorder, single episode, unspecified: F32.9

## 2013-09-08 HISTORY — DX: Depression, unspecified: F32.A

## 2013-09-08 LAB — TYPE AND SCREEN
ABO/RH(D): A POS
ANTIBODY SCREEN: NEGATIVE

## 2013-09-08 LAB — COMPREHENSIVE METABOLIC PANEL
ALBUMIN: 4.1 g/dL (ref 3.5–5.2)
ALT: 14 U/L (ref 0–53)
ANION GAP: 16 — AB (ref 5–15)
AST: 14 U/L (ref 0–37)
Alkaline Phosphatase: 55 U/L (ref 39–117)
BILIRUBIN TOTAL: 0.8 mg/dL (ref 0.3–1.2)
BUN: 16 mg/dL (ref 6–23)
CALCIUM: 9.6 mg/dL (ref 8.4–10.5)
CO2: 21 mEq/L (ref 19–32)
CREATININE: 1.02 mg/dL (ref 0.50–1.35)
Chloride: 99 mEq/L (ref 96–112)
GFR calc Af Amer: 83 mL/min — ABNORMAL LOW (ref >90)
GFR calc non Af Amer: 72 mL/min — ABNORMAL LOW (ref >90)
Glucose, Bld: 84 mg/dL (ref 70–99)
Potassium: 3.8 mEq/L (ref 3.7–5.3)
Sodium: 136 mEq/L — ABNORMAL LOW (ref 137–147)
Total Protein: 8.1 g/dL (ref 6.0–8.3)

## 2013-09-08 LAB — BLOOD GAS, ARTERIAL
ACID-BASE EXCESS: 1.2 mmol/L (ref 0.0–2.0)
Bicarbonate: 25 mEq/L — ABNORMAL HIGH (ref 20.0–24.0)
Drawn by: 421801
FIO2: 0.21 %
O2 SAT: 95.1 %
Patient temperature: 98.6
TCO2: 26.2 mmol/L (ref 0–100)
pCO2 arterial: 38 mmHg (ref 35.0–45.0)
pH, Arterial: 7.434 (ref 7.350–7.450)
pO2, Arterial: 75.2 mmHg — ABNORMAL LOW (ref 80.0–100.0)

## 2013-09-08 LAB — URINALYSIS, ROUTINE W REFLEX MICROSCOPIC
Bilirubin Urine: NEGATIVE
Glucose, UA: NEGATIVE mg/dL
Hgb urine dipstick: NEGATIVE
KETONES UR: NEGATIVE mg/dL
Leukocytes, UA: NEGATIVE
NITRITE: NEGATIVE
PH: 6 (ref 5.0–8.0)
Protein, ur: NEGATIVE mg/dL
Specific Gravity, Urine: 1.017 (ref 1.005–1.030)
Urobilinogen, UA: 0.2 mg/dL (ref 0.0–1.0)

## 2013-09-08 LAB — CBC
HEMATOCRIT: 42.4 % (ref 39.0–52.0)
Hemoglobin: 15 g/dL (ref 13.0–17.0)
MCH: 31.6 pg (ref 26.0–34.0)
MCHC: 35.4 g/dL (ref 30.0–36.0)
MCV: 89.5 fL (ref 78.0–100.0)
PLATELETS: 218 10*3/uL (ref 150–400)
RBC: 4.74 MIL/uL (ref 4.22–5.81)
RDW: 13.5 % (ref 11.5–15.5)
WBC: 7.8 10*3/uL (ref 4.0–10.5)

## 2013-09-08 LAB — SURGICAL PCR SCREEN
MRSA, PCR: NEGATIVE
STAPHYLOCOCCUS AUREUS: POSITIVE — AB

## 2013-09-08 LAB — PROTIME-INR
INR: 1.03 (ref 0.00–1.49)
Prothrombin Time: 13.5 seconds (ref 11.6–15.2)

## 2013-09-08 LAB — ABO/RH: ABO/RH(D): A POS

## 2013-09-08 LAB — APTT: aPTT: 33 seconds (ref 24–37)

## 2013-09-08 NOTE — Pre-Procedure Instructions (Signed)
Brandon Newton  09/08/2013   Your procedure is scheduled on:  Friday, September 10, 2013 at 1:05 PM.   Report to [facility] Entrance "A" Admitting Office at 11:00 AM.   Call this number if you have problems the morning of surgery: [phone]   Remember:   Do not eat food or drink liquids after midnight Thursday, 09/09/13.   Take these medicines the morning of surgery with A SIP OF WATER: escitalopram (LEXAPRO), FLUoxetine (PROZAC), may use eye drops if needed.  Stop Aspirin and Vitamins as of today.   Do not wear jewelry.  Do not wear lotions, powders, or cologne. You may NOT wear deodorant.  Men may shave face and neck.  Do not bring valuables to the hospital.  [facility] is not responsible                  for any belongings or valuables.               Contacts, dentures or bridgework may not be worn into surgery.  Leave suitcase in the car. After surgery it may be brought to your room.  For patients admitted to the hospital, discharge time is determined by your                treatment team.             Special Instructions: [place] - Preparing for Surgery  Before surgery, you can play an important role.  Because skin is not sterile, your skin needs to be as free of germs as possible.  You can reduce the number of germs on you skin by washing with CHG (chlorahexidine gluconate) soap before surgery.  CHG is an antiseptic cleaner which kills germs and bonds with the skin to continue killing germs even after washing.  Please DO NOT use if you have an allergy to CHG or antibacterial soaps.  If your skin becomes reddened/irritated stop using the CHG and inform your nurse when you arrive at Short Stay.  Do not shave (including legs and underarms) for at least 48 hours prior to the first CHG shower.  You may shave your face.  Please follow these instructions carefully:   1.  Shower with CHG Soap the night before surgery and the                                morning of  Surgery.  2.  If you choose to wash your hair, wash your hair first as usual with your       normal shampoo.  3.  After you shampoo, rinse your hair and body thoroughly to remove the                      Shampoo.  4.  Use CHG as you would any other liquid soap.  You can apply chg directly       to the skin and wash gently with scrungie or a clean washcloth.  5.  Apply the CHG Soap to your body ONLY FROM THE NECK DOWN.        Do not use on open wounds or open sores.  Avoid contact with your eyes, ears, mouth and genitals (private parts).  Wash genitals (private parts) with your normal soap.  6.  Wash thoroughly, paying special attention to the area where your surgery  will be performed.  7.  Thoroughly rinse your body with warm water from the neck down.  8.  DO NOT shower/wash with your normal soap after using and rinsing off       the CHG Soap.  9.  Pat yourself dry with a clean towel.            10.  Wear clean pajamas.            11.  Place clean sheets on your bed the night of your first shower and do not        sleep with pets.  Day of Surgery  Do not apply any lotions/deodorants the morning of surgery.  Please wear clean clothes to the hospital/surgery center.     Please read over the following fact sheets that you were given: Pain Booklet, Coughing and Deep Breathing, Blood Transfusion Information, MRSA Information and Surgical Site Infection Prevention

## 2013-09-08 NOTE — Progress Notes (Signed)
Mupirocin ointment Rx called into [facility] for positive PCR of staph. Left message on pt's daughter's phone. Pt does not speak [place].

## 2013-09-08 NOTE — Progress Notes (Signed)
Interpreter with patient during PAT appt along with pt's daughter who also assisted with interpreting. Pt's daughter is an Magazine features editor in [place], [place]. She states that the surgery was originally scheduled for the AM, but has been changed to an afternoon start time and the interpreter who is here today cannot come Friday afternoon. The interpreter states that the company is trying to find one, but not sure if they have a [place] interpreter available. Pt's daughter states she does not mind being the interpreter. I had pt sign the interpreter release form (after the interpreter explained it to him) in case daughter needs to interpret.

## 2013-09-08 NOTE — Progress Notes (Signed)
09/08/13 1546  OBSTRUCTIVE SLEEP APNEA  Have you ever been diagnosed with sleep apnea through a sleep study? No  Do you snore loudly (loud enough to be heard through closed doors)?  1  Do you often feel tired, fatigued, or sleepy during the daytime? 0  Has anyone observed you stop breathing during your sleep? 0  Do you have, or are you being treated for high blood pressure? 1  BMI more than 35 kg/m2? 0  Age over 72 years old? 1  Neck circumference greater than 40 cm/16 inches? 1 (17.5)  Gender: 1  Obstructive Sleep Apnea Score 5  Score 4 or greater  Results sent to PCP

## 2013-09-09 MED ORDER — DEXTROSE 5 % IV SOLN
1.5000 g | INTRAVENOUS | Status: AC
  Administered 2013-09-10: 1.5 g via INTRAVENOUS
  Filled 2013-09-09: qty 1.5

## 2013-09-10 ENCOUNTER — Encounter (HOSPITAL_COMMUNITY): Payer: Self-pay | Admitting: Anesthesiology

## 2013-09-10 ENCOUNTER — Inpatient Hospital Stay (HOSPITAL_COMMUNITY): Payer: BC Managed Care – PPO

## 2013-09-10 ENCOUNTER — Inpatient Hospital Stay (HOSPITAL_COMMUNITY)
Admission: RE | Admit: 2013-09-10 | Discharge: 2013-09-16 | DRG: 165 | Disposition: A | Discharge status: Home / Self Care | Payer: BC Managed Care – PPO | Source: Ambulatory Visit | Attending: Thoracic Surgery (Cardiothoracic Vascular Surgery) | Admitting: Thoracic Surgery (Cardiothoracic Vascular Surgery)

## 2013-09-10 ENCOUNTER — Encounter (HOSPITAL_COMMUNITY)
Admission: RE | Disposition: A | Discharge status: Home / Self Care | Payer: Self-pay | Source: Ambulatory Visit | Attending: Thoracic Surgery (Cardiothoracic Vascular Surgery)

## 2013-09-10 ENCOUNTER — Inpatient Hospital Stay (HOSPITAL_COMMUNITY): Payer: BC Managed Care – PPO | Admitting: Anesthesiology

## 2013-09-10 ENCOUNTER — Encounter (HOSPITAL_COMMUNITY): Payer: BC Managed Care – PPO | Admitting: Anesthesiology

## 2013-09-10 DIAGNOSIS — Z85828 Personal history of other malignant neoplasm of skin: Secondary | ICD-10-CM | POA: Diagnosis not present

## 2013-09-10 DIAGNOSIS — Z01810 Encounter for preprocedural cardiovascular examination: Secondary | ICD-10-CM | POA: Diagnosis not present

## 2013-09-10 DIAGNOSIS — E876 Hypokalemia: Secondary | ICD-10-CM | POA: Diagnosis not present

## 2013-09-10 DIAGNOSIS — C3430 Malignant neoplasm of lower lobe, unspecified bronchus or lung: Secondary | ICD-10-CM | POA: Diagnosis present

## 2013-09-10 DIAGNOSIS — Z7982 Long term (current) use of aspirin: Secondary | ICD-10-CM

## 2013-09-10 DIAGNOSIS — R911 Solitary pulmonary nodule: Secondary | ICD-10-CM

## 2013-09-10 DIAGNOSIS — Z801 Family history of malignant neoplasm of trachea, bronchus and lung: Secondary | ICD-10-CM | POA: Diagnosis not present

## 2013-09-10 DIAGNOSIS — I498 Other specified cardiac arrhythmias: Secondary | ICD-10-CM | POA: Diagnosis not present

## 2013-09-10 DIAGNOSIS — F3289 Other specified depressive episodes: Secondary | ICD-10-CM | POA: Diagnosis present

## 2013-09-10 DIAGNOSIS — Z79899 Other long term (current) drug therapy: Secondary | ICD-10-CM | POA: Diagnosis not present

## 2013-09-10 DIAGNOSIS — F411 Generalized anxiety disorder: Secondary | ICD-10-CM | POA: Diagnosis present

## 2013-09-10 DIAGNOSIS — I4891 Unspecified atrial fibrillation: Secondary | ICD-10-CM | POA: Diagnosis present

## 2013-09-10 DIAGNOSIS — Z87891 Personal history of nicotine dependence: Secondary | ICD-10-CM | POA: Diagnosis not present

## 2013-09-10 DIAGNOSIS — F329 Major depressive disorder, single episode, unspecified: Secondary | ICD-10-CM | POA: Diagnosis present

## 2013-09-10 DIAGNOSIS — D491 Neoplasm of unspecified behavior of respiratory system: Secondary | ICD-10-CM | POA: Diagnosis not present

## 2013-09-10 DIAGNOSIS — Z01812 Encounter for preprocedural laboratory examination: Secondary | ICD-10-CM

## 2013-09-10 DIAGNOSIS — R222 Localized swelling, mass and lump, trunk: Secondary | ICD-10-CM | POA: Diagnosis present

## 2013-09-10 DIAGNOSIS — Z01818 Encounter for other preprocedural examination: Secondary | ICD-10-CM

## 2013-09-10 DIAGNOSIS — I10 Essential (primary) hypertension: Secondary | ICD-10-CM | POA: Diagnosis present

## 2013-09-10 DIAGNOSIS — Z902 Acquired absence of lung [part of]: Secondary | ICD-10-CM

## 2013-09-10 HISTORY — PX: VIDEO ASSISTED THORACOSCOPY (VATS)/WEDGE RESECTION: SHX6174

## 2013-09-10 SURGERY — VIDEO ASSISTED THORACOSCOPY (VATS)/WEDGE RESECTION
Anesthesia: General | Site: Chest | Laterality: Left

## 2013-09-10 MED ORDER — PROPOFOL 10 MG/ML IV BOLUS
INTRAVENOUS | Status: AC
  Filled 2013-09-10: qty 20

## 2013-09-10 MED ORDER — PHENYLEPHRINE 40 MCG/ML (10ML) SYRINGE FOR IV PUSH (FOR BLOOD PRESSURE SUPPORT)
PREFILLED_SYRINGE | INTRAVENOUS | Status: AC
  Filled 2013-09-10: qty 10

## 2013-09-10 MED ORDER — DIPHENHYDRAMINE HCL 50 MG/ML IJ SOLN: 12.5000 mg | Freq: Four times a day (QID) | INTRAMUSCULAR | Status: DC | PRN

## 2013-09-10 MED ORDER — BUPIVACAINE HCL (PF) 0.5 % IJ SOLN
INTRAMUSCULAR | Status: DC | PRN
  Administered 2013-09-10: 10 mL

## 2013-09-10 MED ORDER — POTASSIUM CHLORIDE 10 MEQ/50ML IV SOLN: 10.0000 meq | Freq: Every day | INTRAVENOUS | Status: DC | PRN

## 2013-09-10 MED ORDER — BUPIVACAINE 0.5 % ON-Q PUMP SINGLE CATH 400 ML
400.0000 mL | INJECTION | Status: DC
  Administered 2013-09-10: 400 mL
  Filled 2013-09-10: qty 400

## 2013-09-10 MED ORDER — FENTANYL CITRATE 0.05 MG/ML IJ SOLN
INTRAMUSCULAR | Status: DC | PRN
  Administered 2013-09-10: 50 ug via INTRAVENOUS
  Administered 2013-09-10: 150 ug via INTRAVENOUS
  Administered 2013-09-10 (×3): 50 ug via INTRAVENOUS

## 2013-09-10 MED ORDER — FLUOXETINE HCL 10 MG PO CAPS
10.0000 mg | ORAL_CAPSULE | Freq: Every day | ORAL | Status: DC
  Administered 2013-09-11 – 2013-09-16 (×6): 10 mg via ORAL
  Filled 2013-09-10 (×6): qty 1

## 2013-09-10 MED ORDER — FENTANYL 10 MCG/ML IV SOLN
INTRAVENOUS | Status: DC
  Administered 2013-09-10: 18:00:00 via INTRAVENOUS
  Administered 2013-09-11 – 2013-09-12 (×4): 10 ug via INTRAVENOUS
  Filled 2013-09-10: qty 50

## 2013-09-10 MED ORDER — STERILE WATER FOR INJECTION IJ SOLN
INTRAMUSCULAR | Status: AC
  Filled 2013-09-10: qty 10

## 2013-09-10 MED ORDER — MUPIROCIN CALCIUM 2 % EX CREA: TOPICAL_CREAM | Freq: Two times a day (BID) | CUTANEOUS | Status: DC

## 2013-09-10 MED ORDER — NALOXONE HCL 0.4 MG/ML IJ SOLN: 0.4000 mg | INTRAMUSCULAR | Status: DC | PRN

## 2013-09-10 MED ORDER — ALBUTEROL SULFATE (2.5 MG/3ML) 0.083% IN NEBU
2.5000 mg | INHALATION_SOLUTION | RESPIRATORY_TRACT | Status: DC
  Filled 2013-09-10: qty 3

## 2013-09-10 MED ORDER — ONDANSETRON HCL 4 MG/2ML IJ SOLN
INTRAMUSCULAR | Status: DC | PRN
  Administered 2013-09-10: 4 mg via INTRAVENOUS

## 2013-09-10 MED ORDER — OXYCODONE HCL 5 MG PO TABS: 5.0000 mg | ORAL_TABLET | ORAL | Status: DC | PRN

## 2013-09-10 MED ORDER — GLYCOPYRROLATE 0.2 MG/ML IJ SOLN
INTRAMUSCULAR | Status: DC | PRN
  Administered 2013-09-10: 0.4 mg via INTRAVENOUS
  Administered 2013-09-10: 0.2 mg via INTRAVENOUS

## 2013-09-10 MED ORDER — ASPIRIN EC 81 MG PO TBEC
81.0000 mg | DELAYED_RELEASE_TABLET | Freq: Every day | ORAL | Status: DC
  Administered 2013-09-11 – 2013-09-16 (×6): 81 mg via ORAL
  Filled 2013-09-10 (×6): qty 1

## 2013-09-10 MED ORDER — DEXTROSE-NACL 5-0.9 % IV SOLN
INTRAVENOUS | Status: DC
  Administered 2013-09-10 – 2013-09-11 (×3): via INTRAVENOUS

## 2013-09-10 MED ORDER — NEOSTIGMINE METHYLSULFATE 10 MG/10ML IV SOLN
INTRAVENOUS | Status: DC | PRN
  Administered 2013-09-10: 3 mg via INTRAVENOUS

## 2013-09-10 MED ORDER — MUPIROCIN CALCIUM 2 % EX CREA
TOPICAL_CREAM | Freq: Two times a day (BID) | CUTANEOUS | Status: AC
  Administered 2013-09-10 – 2013-09-12 (×4): via TOPICAL
  Administered 2013-09-12 – 2013-09-13 (×2): 1 via TOPICAL
  Administered 2013-09-13 – 2013-09-15 (×4): via TOPICAL
  Filled 2013-09-10 (×2): qty 15

## 2013-09-10 MED ORDER — ONDANSETRON HCL 4 MG/2ML IJ SOLN
INTRAMUSCULAR | Status: AC
  Filled 2013-09-10: qty 2

## 2013-09-10 MED ORDER — FENTANYL CITRATE 0.05 MG/ML IJ SOLN
INTRAMUSCULAR | Status: AC
  Filled 2013-09-10: qty 5

## 2013-09-10 MED ORDER — LIDOCAINE HCL 4 % MT SOLN
OROMUCOSAL | Status: DC | PRN
  Administered 2013-09-10: 4 mL via TOPICAL

## 2013-09-10 MED ORDER — SODIUM CHLORIDE 0.9 % IJ SOLN: 9.0000 mL | INTRAMUSCULAR | Status: DC | PRN

## 2013-09-10 MED ORDER — PHENYLEPHRINE HCL 10 MG/ML IJ SOLN
INTRAMUSCULAR | Status: DC | PRN
  Administered 2013-09-10: 80 ug via INTRAVENOUS
  Administered 2013-09-10: 40 ug via INTRAVENOUS
  Administered 2013-09-10 (×6): 80 ug via INTRAVENOUS
  Administered 2013-09-10 (×2): 40 ug via INTRAVENOUS
  Administered 2013-09-10: 80 ug via INTRAVENOUS
  Administered 2013-09-10: 40 ug via INTRAVENOUS
  Administered 2013-09-10 (×2): 80 ug via INTRAVENOUS
  Administered 2013-09-10: 40 ug via INTRAVENOUS

## 2013-09-10 MED ORDER — ONDANSETRON HCL 4 MG/2ML IJ SOLN
4.0000 mg | Freq: Four times a day (QID) | INTRAMUSCULAR | Status: DC | PRN
  Administered 2013-09-15: 4 mg via INTRAVENOUS
  Filled 2013-09-10 (×2): qty 2

## 2013-09-10 MED ORDER — VECURONIUM BROMIDE 10 MG IV SOLR
INTRAVENOUS | Status: DC | PRN
Start: 2013-09-10 — End: 2013-09-10
  Administered 2013-09-10 (×2): 2 mg via INTRAVENOUS
  Administered 2013-09-10 (×2): 1 mg via INTRAVENOUS

## 2013-09-10 MED ORDER — ESCITALOPRAM OXALATE 5 MG PO TABS
5.0000 mg | ORAL_TABLET | Freq: Every day | ORAL | Status: DC
  Administered 2013-09-11 – 2013-09-16 (×6): 5 mg via ORAL
  Filled 2013-09-10 (×6): qty 1

## 2013-09-10 MED ORDER — ONDANSETRON HCL 4 MG/2ML IJ SOLN: 4.0000 mg | Freq: Once | INTRAMUSCULAR | Status: DC | PRN

## 2013-09-10 MED ORDER — ARTIFICIAL TEARS OP OINT
TOPICAL_OINTMENT | OPHTHALMIC | Status: AC
  Filled 2013-09-10: qty 3.5

## 2013-09-10 MED ORDER — LIDOCAINE HCL (CARDIAC) 20 MG/ML IV SOLN
INTRAVENOUS | Status: DC | PRN
  Administered 2013-09-10: 100 mg via INTRAVENOUS

## 2013-09-10 MED ORDER — CEFUROXIME SODIUM 1.5 G IJ SOLR
1.5000 g | Freq: Two times a day (BID) | INTRAMUSCULAR | Status: AC
  Administered 2013-09-11 (×2): 1.5 g via INTRAVENOUS
  Filled 2013-09-10 (×2): qty 1.5

## 2013-09-10 MED ORDER — LIDOCAINE HCL (CARDIAC) 20 MG/ML IV SOLN
INTRAVENOUS | Status: AC
  Filled 2013-09-10: qty 5

## 2013-09-10 MED ORDER — 0.9 % SODIUM CHLORIDE (POUR BTL) OPTIME
TOPICAL | Status: DC | PRN
  Administered 2013-09-10: 2000 mL

## 2013-09-10 MED ORDER — GLYCOPYRROLATE 0.2 MG/ML IJ SOLN
INTRAMUSCULAR | Status: AC
  Filled 2013-09-10: qty 1

## 2013-09-10 MED ORDER — TRIAMTERENE-HCTZ 37.5-25 MG PO TABS
1.0000 | ORAL_TABLET | Freq: Every day | ORAL | Status: DC
  Administered 2013-09-11 – 2013-09-16 (×6): 1 via ORAL
  Filled 2013-09-10 (×6): qty 1

## 2013-09-10 MED ORDER — ACETAMINOPHEN 160 MG/5ML PO SOLN
1000.0000 mg | Freq: Four times a day (QID) | ORAL | Status: AC
Start: 2013-09-10 — End: 2013-09-15
  Filled 2013-09-10 (×20): qty 40

## 2013-09-10 MED ORDER — ONDANSETRON HCL 4 MG/2ML IJ SOLN
4.0000 mg | Freq: Four times a day (QID) | INTRAMUSCULAR | Status: DC | PRN
  Administered 2013-09-12: 4 mg via INTRAVENOUS

## 2013-09-10 MED ORDER — FENTANYL CITRATE 0.05 MG/ML IJ SOLN
INTRAMUSCULAR | Status: AC
  Administered 2013-09-10: 100 ug
  Filled 2013-09-10: qty 2

## 2013-09-10 MED ORDER — ROCURONIUM BROMIDE 50 MG/5ML IV SOLN
INTRAVENOUS | Status: AC
  Filled 2013-09-10: qty 1

## 2013-09-10 MED ORDER — BISACODYL 5 MG PO TBEC
10.0000 mg | DELAYED_RELEASE_TABLET | Freq: Every day | ORAL | Status: DC
  Administered 2013-09-11 – 2013-09-16 (×4): 10 mg via ORAL
  Filled 2013-09-10 (×4): qty 2

## 2013-09-10 MED ORDER — SENNOSIDES-DOCUSATE SODIUM 8.6-50 MG PO TABS
1.0000 | ORAL_TABLET | Freq: Every day | ORAL | Status: DC
  Administered 2013-09-11: 1 via ORAL
  Filled 2013-09-10 (×7): qty 1

## 2013-09-10 MED ORDER — ACETAMINOPHEN 500 MG PO TABS
1000.0000 mg | ORAL_TABLET | Freq: Four times a day (QID) | ORAL | Status: AC
Start: 2013-09-10 — End: 2013-09-15
  Administered 2013-09-10 – 2013-09-15 (×18): 1000 mg via ORAL
  Filled 2013-09-10 (×18): qty 2

## 2013-09-10 MED ORDER — ROCURONIUM BROMIDE 100 MG/10ML IV SOLN
INTRAVENOUS | Status: DC | PRN
  Administered 2013-09-10: 50 mg via INTRAVENOUS

## 2013-09-10 MED ORDER — VECURONIUM BROMIDE 10 MG IV SOLR
INTRAVENOUS | Status: AC
  Filled 2013-09-10: qty 10

## 2013-09-10 MED ORDER — SUCCINYLCHOLINE CHLORIDE 20 MG/ML IJ SOLN
INTRAMUSCULAR | Status: AC
  Filled 2013-09-10: qty 1

## 2013-09-10 MED ORDER — LACTATED RINGERS IV SOLN
INTRAVENOUS | Status: DC
  Administered 2013-09-10 (×3): via INTRAVENOUS

## 2013-09-10 MED ORDER — ARTIFICIAL TEARS OP OINT
TOPICAL_OINTMENT | OPHTHALMIC | Status: DC | PRN
  Administered 2013-09-10: 1 via OPHTHALMIC

## 2013-09-10 MED ORDER — DIPHENHYDRAMINE HCL 12.5 MG/5ML PO ELIX
12.5000 mg | ORAL_SOLUTION | Freq: Four times a day (QID) | ORAL | Status: DC | PRN
  Filled 2013-09-10: qty 5

## 2013-09-10 MED ORDER — HYDROMORPHONE HCL PF 1 MG/ML IJ SOLN
0.2500 mg | INTRAMUSCULAR | Status: DC | PRN
  Administered 2013-09-10 (×2): 0.5 mg via INTRAVENOUS

## 2013-09-10 MED ORDER — BUPIVACAINE HCL (PF) 0.5 % IJ SOLN
INTRAMUSCULAR | Status: AC
  Filled 2013-09-10: qty 10

## 2013-09-10 MED ORDER — HYDROMORPHONE HCL PF 1 MG/ML IJ SOLN
INTRAMUSCULAR | Status: AC
  Administered 2013-09-10: 0.5 mg via INTRAVENOUS
  Filled 2013-09-10: qty 1

## 2013-09-10 MED ORDER — PROPOFOL 10 MG/ML IV BOLUS
INTRAVENOUS | Status: DC | PRN
  Administered 2013-09-10: 50 mg via INTRAVENOUS
  Administered 2013-09-10: 150 mg via INTRAVENOUS

## 2013-09-10 MED ORDER — TRAMADOL HCL 50 MG PO TABS
50.0000 mg | ORAL_TABLET | Freq: Four times a day (QID) | ORAL | Status: DC | PRN
  Administered 2013-09-13: 50 mg via ORAL
  Filled 2013-09-10: qty 1

## 2013-09-10 SURGICAL SUPPLY — 82 items
APPLICATOR COTTON TIP 6IN STRL (MISCELLANEOUS) ×3 IMPLANT
BENZOIN TINCTURE PRP APPL 2/3 (GAUZE/BANDAGES/DRESSINGS) ×3 IMPLANT
CANISTER SUCTION 2500CC (MISCELLANEOUS) ×6 IMPLANT
CATH KIT ON Q 5IN SLV (PAIN MANAGEMENT) ×3 IMPLANT
CATH THORACIC 28FR (CATHETERS) IMPLANT
CATH THORACIC 36FR (CATHETERS) IMPLANT
CATH THORACIC 36FR RT ANG (CATHETERS) IMPLANT
CLIP TI MEDIUM 6 (CLIP) ×3 IMPLANT
CONN ST 1/4X3/8  BEN (MISCELLANEOUS) ×2
CONN ST 1/4X3/8 BEN (MISCELLANEOUS) ×1 IMPLANT
CONN Y 3/8X3/8X3/8  BEN (MISCELLANEOUS) ×2
CONN Y 3/8X3/8X3/8 BEN (MISCELLANEOUS) ×1 IMPLANT
CONT SPEC 4OZ CLIKSEAL STRL BL (MISCELLANEOUS) ×6 IMPLANT
CONT SPECI 4OZ STER CLIK (MISCELLANEOUS) ×30 IMPLANT
COVER SURGICAL LIGHT HANDLE (MISCELLANEOUS) ×3 IMPLANT
DERMABOND ADVANCED (GAUZE/BANDAGES/DRESSINGS) ×2
DERMABOND ADVANCED .7 DNX12 (GAUZE/BANDAGES/DRESSINGS) ×1 IMPLANT
DRAIN CHANNEL 28F RND 3/8 FF (WOUND CARE) IMPLANT
DRAIN CHANNEL 32F RND 10.7 FF (WOUND CARE) IMPLANT
DRAPE LAPAROSCOPIC ABDOMINAL (DRAPES) ×3 IMPLANT
DRAPE WARM FLUID 44X44 (DRAPE) ×3 IMPLANT
ELECT REM PT RETURN 9FT ADLT (ELECTROSURGICAL) ×3
ELECTRODE REM PT RTRN 9FT ADLT (ELECTROSURGICAL) ×1 IMPLANT
GAUZE SPONGE 4X4 12PLY STRL (GAUZE/BANDAGES/DRESSINGS) ×3 IMPLANT
GLOVE SURG SIGNA 7.5 PF LTX (GLOVE) ×6 IMPLANT
GOWN STRL REUS W/ TWL LRG LVL3 (GOWN DISPOSABLE) ×2 IMPLANT
GOWN STRL REUS W/ TWL XL LVL3 (GOWN DISPOSABLE) ×3 IMPLANT
GOWN STRL REUS W/TWL LRG LVL3 (GOWN DISPOSABLE) ×4
GOWN STRL REUS W/TWL XL LVL3 (GOWN DISPOSABLE) ×6
HANDLE STAPLE ENDO GIA SHORT (STAPLE) ×2
HEMOSTAT SURGICEL 2X14 (HEMOSTASIS) IMPLANT
KIT BASIN OR (CUSTOM PROCEDURE TRAY) ×3 IMPLANT
KIT ROOM TURNOVER OR (KITS) ×3 IMPLANT
KIT SUCTION CATH 14FR (SUCTIONS) ×3 IMPLANT
NS IRRIG 1000ML POUR BTL (IV SOLUTION) ×12 IMPLANT
PACK CHEST (CUSTOM PROCEDURE TRAY) ×3 IMPLANT
PAD ARMBOARD 7.5X6 YLW CONV (MISCELLANEOUS) ×6 IMPLANT
POUCH ENDO CATCH II 15MM (MISCELLANEOUS) ×3 IMPLANT
POUCH SPECIMEN RETRIEVAL 10MM (ENDOMECHANICALS) ×3 IMPLANT
RELOAD EGIA 45 MED/THCK PURPLE (STAPLE) ×24 IMPLANT
RELOAD EGIA TRIS TAN 45 CVD (STAPLE) ×12 IMPLANT
RELOAD TRI 2.0 60 XTHK VAS SUL (STAPLE) ×3 IMPLANT
SEALANT PROGEL (MISCELLANEOUS) IMPLANT
SEALANT SURG COSEAL 4ML (VASCULAR PRODUCTS) IMPLANT
SEALANT SURG COSEAL 8ML (VASCULAR PRODUCTS) IMPLANT
SOLUTION ANTI FOG 6CC (MISCELLANEOUS) ×3 IMPLANT
SPECIMEN JAR MEDIUM (MISCELLANEOUS) ×3 IMPLANT
SPONGE GAUZE 4X4 12PLY STER LF (GAUZE/BANDAGES/DRESSINGS) ×3 IMPLANT
SPONGE INTESTINAL PEANUT (DISPOSABLE) ×15 IMPLANT
STAPLER ENDO GIA 12MM SHORT (STAPLE) ×1 IMPLANT
SUT PROLENE 4 0 RB 1 (SUTURE)
SUT PROLENE 4-0 RB1 .5 CRCL 36 (SUTURE) IMPLANT
SUT SILK  1 MH (SUTURE) ×4
SUT SILK 1 MH (SUTURE) ×2 IMPLANT
SUT SILK 2 0 SH (SUTURE) ×3 IMPLANT
SUT SILK 2 0SH CR/8 30 (SUTURE) IMPLANT
SUT SILK 3 0 SH 30 (SUTURE) ×3 IMPLANT
SUT SILK 3 0SH CR/8 30 (SUTURE) IMPLANT
SUT VIC AB 0 CTX 27 (SUTURE) IMPLANT
SUT VIC AB 1 CTX 27 (SUTURE) ×3 IMPLANT
SUT VIC AB 2-0 CT1 27 (SUTURE) ×2
SUT VIC AB 2-0 CT1 TAPERPNT 27 (SUTURE) ×1 IMPLANT
SUT VIC AB 2-0 CTX 36 (SUTURE) IMPLANT
SUT VIC AB 3-0 MH 27 (SUTURE) IMPLANT
SUT VIC AB 3-0 SH 27 (SUTURE)
SUT VIC AB 3-0 SH 27X BRD (SUTURE) IMPLANT
SUT VIC AB 3-0 X1 27 (SUTURE) ×6 IMPLANT
SUT VICRYL 0 UR6 27IN ABS (SUTURE) ×6 IMPLANT
SUT VICRYL 2 TP 1 (SUTURE) IMPLANT
SWAB COLLECTION DEVICE MRSA (MISCELLANEOUS) IMPLANT
SYSTEM SAHARA CHEST DRAIN ATS (WOUND CARE) ×3 IMPLANT
TAPE CLOTH SURG 4X10 WHT LF (GAUZE/BANDAGES/DRESSINGS) ×3 IMPLANT
TIP APPLICATOR SPRAY EXTEND 16 (VASCULAR PRODUCTS) IMPLANT
TOWEL OR 17X24 6PK STRL BLUE (TOWEL DISPOSABLE) ×3 IMPLANT
TOWEL OR 17X26 10 PK STRL BLUE (TOWEL DISPOSABLE) ×6 IMPLANT
TRAP SPECIMEN MUCOUS 40CC (MISCELLANEOUS) IMPLANT
TRAY FOLEY CATH 16FRSI W/METER (SET/KITS/TRAYS/PACK) ×3 IMPLANT
TROCAR XCEL BLADELESS 5X75MML (TROCAR) ×3 IMPLANT
TROCAR XCEL NON-BLD 5MMX100MML (ENDOMECHANICALS) IMPLANT
TUBE ANAEROBIC SPECIMEN COL (MISCELLANEOUS) IMPLANT
TUNNELER SHEATH ON-Q 11GX8 DSP (PAIN MANAGEMENT) ×3 IMPLANT
WATER STERILE IRR 1000ML POUR (IV SOLUTION) ×6 IMPLANT

## 2013-09-10 NOTE — Op Note (Signed)
NAME:  Brandon Newton, Brandon Newton            ACCOUNT NO.:  [id number]  MEDICAL RECORD NO.:  [id number]  LOCATION:  3S15C                        FACILITY:  [facility]  PHYSICIAN:  Revonda Standard. Roxan Hockey, M.D.DATE OF BIRTH:  April 20, 1940  DATE OF PROCEDURE:  09/10/2013 DATE OF DISCHARGE:                              OPERATIVE REPORT   PREOPERATIVE DIAGNOSIS:  Left lower lobe nodule.  POSTOPERATIVE DIAGNOSIS:  Squamous cell carcinoma, left lower lobe, clinical stage IA.  PROCEDURE: 1. Left video-assisted thoracoscopy. 2. Wedge resection left lower lobe. 3. Thoracoscopic left lower lobectomy. 4. Mediastinal lymph node sampling. 5. On-Q local anesthetic catheter placement.  SURGEON:  Revonda Standard. Roxan Hockey, M.D.  ASSISTANT:  John Giovanni, P.A.-C.  ANESTHESIA:  General.  FINDINGS:  A 2 x 3 cm mass in the posterior aspect of left lower lobe.  Frozen section revealed squamous cell carcinoma. Bronchial margins were clear.  Enlarged, but otherwise benign-appearing lymph nodes.  CLINICAL NOTE:  Brandon Newton is a 73 year old gentleman, initially from [place].  He has a history of tobacco abuse.  He recently had a chest x-ray, which showed a left lower lobe mass.  This was confirmed by CT. A PET was done and showed the mass was hypermetabolic.  The patient was referred for surgical consideration. There was no evidence of metastatic disease.  Preoperative stress test was normal and pulmonary function testing was adequate to tolerate a surgical resection.  The patient was advised to undergo left thoracoscopy with wedge resection of left lower mass and possible segmentectomy or lower lobectomy depending on intraoperative findings if the lesion was cancerous.  The indications, risks, benefits, and alternatives were discussed in detail with the patient.  He understood and accepted the risks and agreed to proceed.  OPERATIVE NOTE:  [name] was brought to the preoperative holding area on September 10, 2013.  There, Anesthesia placed a central line and an arterial blood pressure monitoring line.  He was taken to the operating room, anesthetized, and intubated with a double-lumen endotracheal tube. Intravenous antibiotics were administered.  Sequential compressive devices were placed on the legs for DVT prophylaxis.  A Foley catheter was placed.  He was placed in a right lateral decubitus position and the left chest was prepped and draped in the usual sterile fashion.  Single lung ventilation of the right lung was initiated and was tolerated well throughout the procedure.  An incision was made in the seventh intercostal space in the midaxillary line.  A 5 mm port was inserted and a thoracoscope was advanced into the chest.  There was good isolation of the left lung.  There was no pleural effusion.  There was anthracotic change of the pulmonary parenchyma.  A small working incision was made approximately 5 cm in length in the 5th interspace anterolaterally.  No rib spreading was performed during the procedure.  A second port incision was placed for retraction purposes and chest tube placement.  This was anterior to the first incision.  The lower lobe was grasped and retracted anteriorly.  The mass was clearly palpable on the posterior aspect of the lower lobe.  It was likely in the basilar segment, but it was in close proximity to the superior segment  as well.  A wedge resection was performed with sequential firings of an endoscopic GIA stapler.  The specimen was placed into an endoscopic retrieval bag and removed through the incision.  It was sent for frozen section.  While awaiting the results of the frozen section, an On-Q local anesthetic catheter was tunneled through a posterior incision into a subpleural location and primed with 10 mL of 0.5% Marcaine.  It was secured to the skin with a 3-0 silk suture.  Frozen section returned showing squamous cell carcinoma.  The decision was  made to proceed with an anatomic resection, as planned preoperatively.  Although the lesion was likely in the basilar segment, any portion of superior segment that might be preserved would be very small and unlikely to make a significant difference, so the decision made to proceed with lobectomy, feeling that the patient could tolerate this resection.   The inferior ligament was divided, level 9 lymph nodes were sent for pathology.  The pleural reflection was divided at the hilum both posteriorly and anteriorly.  The fissure was incomplete.  Careful dissection was begun in the fissure and the pulmonary artery was identified.  The portion of the fissure between the lingula and the lower lobe was completed with sequential firings of an endoscopic GIA stapler.  At this point, the inferior pulmonary vein was dissected out. All nodes that were encountered during the dissection were sent as separate specimens for permanent pathology.  The inferior pulmonary vein was encircled and then divided with an endoscopic vascular stapler. The dissection was then carried out over the main branch of the pulmonary artery in the fissure.  The majority of the dissection up around the superior segment was able to be done with electrocautery. The superior segmental artery was identified, it was widely separated from the other lower lobe branch arteries.  It was dissected out and divided with an endoscopic vascular stapler.  Next, the basilar segmental branches were carefully dissected out and likewise divided with an endoscopic vascular stapler.  There were 2 large nodes on either side of the bronchus at its bifurcation, these were dissected out. These nodes were large, but otherwise benign appearing and bled easily. At this point, the bronchus was encircled and a stapler was placed across the bronchus and closed.  A test inflation showed good aeration of the upper lobe.  The stapler then was fired dividing the  bronchus. The lower lobe was placed into a large endoscopic retrieval bag and removed through the incision and sent for frozen section.  The bronchial margin subsequently returned clear.  While awaiting results of the final frozen section, additional lymph node dissection was carried out including level 7 nodes. The 4L nodes were not accessible.  Level 5 nodes were dissected out as well.  The chest was copiously irrigated with warm saline.  A test inflation to 30 cm of water revealed no leakage from the bronchial stump.  A 32-French Blake drain was placed through the original port incision and directed posteriorly to the apex.  A 28- French chest tube was placed through the second port incision and directed anteriorly to the apex.  The left upper lobe was reinflated. Both chest tubes were secured with #1 silk sutures.  The incision then was closed with a running #1 Vicryl fascial suture followed by a 2-0 Vicryl subcutaneous suture and a 3-0 Vicryl subcuticular suture.  Chest tubes were placed to suction.  All sponge, needle, and instrument counts were correct at the end of  the procedure.  The patient was placed back in supine position.  He was extubated in the operating room and taken to the postanesthetic care unit in good condition.    Revonda Standard Roxan Hockey, M.D.    SCH/MEDQ  D:  09/10/2013  T:  09/10/2013  Job:  [id number]

## 2013-09-10 NOTE — H&P (View-Only) (Signed)
PCP is [name], MD Referring Provider is Tanda Rockers, MD  Chief Complaint  Patient presents with  . Lung Lesion    RLLobe...CT CHEST 08/23/13.Marland KitchenPET 08/31/13....RLLobe lung nodule suggested to be followed    HPI: 73 year old man sent for consultation regarding a left lower lobe mass.  Brandon Newton is a 73 year old gentleman gentleman originally from [place]. He has a remote history of tobacco abuse smoking about one half a pack a day for 30 years prior to quitting 15 years ago. He saw Dr. Alain Marion in June for the first time in several years. His daughter had noticed that he was less active and more short of breath. The patient denies any significant shortness of breath, but his daughter thought he was very tired when mowing his yard, which is about an acre. He previously to do that without issue. A chest x-ray was done which showed a concern for a 3 x 2 cm left lower lobe mass. A CT of the chest subsequently confirmed a 2.8 x 2.1 cm left lower lobe mass. He saw Dr. Melvyn Novas.  A PET CT was done and the mass was hypermetabolic with an SUV of 38.7. There is no suspicious hilar or mediastinal adenopathy.  Patient has lost 4 pounds over the past 3 months. He denies cough or hemoptysis. He can walk up a flight of stairs without hesitation or shortness of breath. He denies any chest pain, pressure, or tightness. He is no history of coronary disease but did have coronary calcification on his CT scan. He also was recently diagnosed with hypertension. His father had lung cancer.  ECOG/ZUBROD=0     Past Medical History  Diagnosis Date  . Anxiety   . Hypertension   . Cancer     skin    Past Surgical History  Procedure Laterality Date  . Appendectomy      Family History  Problem Relation Age of Onset  . Lung cancer Father 60    smoked  . Stomach cancer Mother     Social History History  Substance Use Topics  . Smoking status: Former Smoker -- 0.50 packs/day for 32 years    Types:  Cigarettes    Quit date: 01/21/1998  . Smokeless tobacco: Never Used  . Alcohol Use: No    Current Outpatient Prescriptions  Medication Sig Dispense Refill  . aspirin EC 81 MG tablet Take 1 tablet (81 mg total) by mouth daily.  100 tablet  3  . cholecalciferol (VITAMIN D) 1000 UNITS tablet Take 1 tablet (1,000 Units total) by mouth daily.  100 tablet  3  . cyanocobalamin 500 MCG tablet Take 500 mcg by mouth daily.      Marland Kitchen triamterene-hydrochlorothiazide (MAXZIDE-25) 37.5-25 MG per tablet Take 1 tablet by mouth daily. qam  30 tablet  11  . escitalopram (LEXAPRO) 10 MG tablet Take 1 tablet (10 mg total) by mouth daily.  30 tablet  5   No current facility-administered medications for this visit.    No Known Allergies  Review of Systems  Constitutional: Positive for fatigue (patient denies but per daughter not as active ) and unexpected weight change (lost 4 pounds in 3 months). Negative for fever and chills.  HENT: Positive for hearing loss.   Respiratory: Positive for shortness of breath (with heavy exertion). Negative for chest tightness and wheezing.   Cardiovascular: Negative for chest pain and leg swelling.  Hematological: Does not bruise/bleed easily.  Psychiatric/Behavioral: Positive for dysphoric mood.  All other systems reviewed and are  negative.   BP 133/93  Pulse 104  Resp 16  Ht 5\' 7"  (1.702 m)  Wt 186 lb (84.369 kg)  BMI 29.12 kg/m2  SpO2 97% Physical Exam  Vitals reviewed. Constitutional: He is oriented to person, place, and time. He appears well-developed and well-nourished. No distress.  HENT:  Head: Normocephalic and atraumatic.  Hard of hearing  Eyes: EOM are normal. Pupils are equal, round, and reactive to light.  + glasses  Neck: Neck supple. No thyromegaly present.  Cardiovascular: Normal rate, regular rhythm, normal heart sounds and intact distal pulses.  Exam reveals no gallop.   No murmur heard. Pulmonary/Chest: Effort normal and breath sounds  normal. He has no wheezes. He has no rales.  Abdominal: Soft. There is no tenderness.  Musculoskeletal: He exhibits no edema.  Lymphadenopathy:    He has no cervical adenopathy.  Neurological: He is alert and oriented to person, place, and time. No cranial nerve deficit.  Skin: Skin is warm and dry.     Diagnostic Tests: CT CHEST WITHOUT CONTRAST  TECHNIQUE:  Multidetector CT imaging of the chest was performed following the  standard protocol without IV contrast.  COMPARISON: None.  FINDINGS:  The central airways are patent. There is a subpleural left lower  lobe pulmonary nodule measuring 2.8 x 2.1 cm. There is hazy right  lower lobe airspace disease dependently likely reflecting  atelectasis. There is a smoothly marginated ellipsoid 9 mm nodule  along the upper right major fissure likely representing an  intrapulmonary lymph node. There is a similar 7 mm smoothly  marginated nodule along the left major fissure. There is no focal  consolidation. There is no pleural effusion or pneumothorax.  There are no pathologically enlarged axillary, hilar or mediastinal  lymph nodes.  The heart size is normal. There is no pericardial effusion. The  thoracic aorta is normal in caliber. There is coronary artery  atherosclerosis in the LAD, circumflex and RCA.  Review of bone windows demonstrates no focal lytic or sclerotic  lesions.  Limited non-contrast images of the upper abdomen were obtained. The  adrenal glands appear normal. The remainder of the visualized  abdominal organs are unremarkable.  IMPRESSION:  1. There is a 2.8 x 2.1 cm left lower lobe subpleural pulmonary  nodule. Differential diagnosis includes round pneumonia versus  malignancy. Recommend follow-up CT in 3 months or PET/CT or Biopsy  for further evaluation. This recommendation follows the consensus  statement: "Guidelines for Management of Small Pulmonary Nodules  Detected on CT Scans: A Statement from the [place]" as  published in Radiology 2005; 237:395-400. Thoracic surgery  consultation may be helpful.  2. Coronary artery disease.  Electronically Signed  By: Kathreen Devoid  On: 08/23/2013 09:46   NUCLEAR MEDICINE PET SKULL BASE TO THIGH  TECHNIQUE:  8.7 mCi F-18 FDG was injected intravenously. Full-ring PET imaging  was performed from the skull base to thigh after the radiotracer. CT  data was obtained and used for attenuation correction and anatomic  localization.  FASTING BLOOD GLUCOSE: Value: 96 mg/dl  COMPARISON: Radiographs 07/27/2013. Chest CT 08/23/2013.  FINDINGS:  NECK  No hypermetabolic cervical lymph nodes are identified.There are no  lesions of the pharyngeal mucosal space. There is minimally  asymmetric activity associated with the muscles of phonation.  CHEST  There are no hypermetabolic mediastinal, hilar or axillary lymph  nodes. The dominant subpleural nodule in the left lower lobe  measures 2.8 x 1.8 cm on image 45 and is hypermetabolic with  an SUV  max of 13.9. There is an area of less well-defined hypermetabolic  activity more superiorly in the subpleural portion of the right  lower lobe. This corresponds with an ill-defined 11 mm density on  image 39 and has an SUV max of 3.3. There is no other abnormal  pulmonary metabolic activity. Specifically, the small area  subpleural nodularity along the major fissures bilaterally  demonstrate no abnormal metabolic activity. Diffuse atherosclerosis  again noted.  ABDOMEN/PELVIS  There is no hypermetabolic activity within the liver, adrenal  glands, spleen or pancreas. There is no hypermetabolic nodal  activity. A small central hepatic cyst, diffuse atherosclerosis,  renal cortical and parapelvic cysts, and mild enlargement of the  prostate gland are noted.  SKELETON  There is no hypermetabolic activity to suggest osseous metastatic  disease.  IMPRESSION:  1. The dominant subpleural left lower lobe pulmonary nodule  is  hypermetabolic and suspicious for bronchogenic malignancy. Tissue  sampling of this nodule is recommended.  2. Indeterminate less well-defined area of low-level upper metabolic  activity in the subpleural right lower lobe. 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 CT follow-up of this finding in 6 months recommended to  evaluate stability.  3. No evidence of metastatic disease.  Electronically Signed  By: Camie Patience M.D.  On: 08/31/2013 09:06   Pulmonary function testing FVC 3.14 (70%) FEV1 1.12 (36%) "Best" FEV1 1.57 (50%) FEV1 FEC 36% Note: His daughter was present during his PFTs and she felt there was miscommunication and he "paced himself" during exhalation  Impression: 73 year old man with a remote history of moderate tobacco use and newly diagnosed COPD who has a 2.8 x 2.1 cm hypermetabolic mass in the left lower lobe. Of note he also has a family history of lung cancer, with his father having had that disease. His pulmonary function testing appears borderline but not prohibitive. Based on his description of activities I suspect that his actual pulmonary function is better than the PFTs would indicate, more along the lines of his "best" FEV1 rather than the average.  I discussed our options with Brandon Newton and his daughter. One option would be to do a CT guided needle biopsy. One problem with that approach is that if the biopsy was negative it would be hard to trust that information. Another option would be to proceed to left VATS and wedge resection, with possible segmentectomy or lobectomy depending on intraoperative frozen section results. The disadvantage says it is a surgical procedure under general anesthesia. The advantage is that it would be definitive diagnostically and potentially therapeutically as well.  I discussed the proposed operation with Brandon Newton and his daughter. We reviewed the indications,  risks, benefits, and alternatives. They understand the general nature of the procedure, including the need for general anesthesia, incisions be used, the intraoperative decision making based on frozen section, the expected hospital stay, and overall recovery. They understand the risks include, but are not limited to death, MI, DVT, PE, stroke, bleeding, possible need for transfusion, infection, prolonged air leak, irregular heart rhythms, as well as the possibility of unforeseeable complications.  He understands and accepts the risks and wishes to proceed with surgical resection.  He is scheduled to have a stress test on Monday. We will also need the results of that test before moving forward.  Plan: Left VATS, wedge resection left lower lobe mass, possible segmentectomy or lobectomy on Friday, August 21, pending clearance from stress  [place]

## 2013-09-10 NOTE — Interval H&P Note (Signed)
History and Physical Interval Note: Stress test normal EF= 61% 09/10/2013 12:29 PM  Brandon Newton  has presented today for surgery, with the diagnosis of Left lower lobe lung nodule  The various methods of treatment have been discussed with the patient and family. After consideration of risks, benefits and other options for treatment, the patient has consented to  Procedure(s): VIDEO ASSISTED THORACOSCOPY (VATS)/WEDGE RESECTION, possible segmentectomy (Left) SEGMENTECTOMY (Left) as a surgical intervention .  The patient's history has been reviewed, patient examined, no change in status, stable for surgery.  I have reviewed the patient's chart and labs.  Questions were answered to the patient's satisfaction.     [name] C

## 2013-09-10 NOTE — Anesthesia Postprocedure Evaluation (Signed)
  Anesthesia Post-op Note  Patient: Brandon Newton  Procedure(s) Performed: Procedure(s): LEFT VIDEO ASSISTED THORACOSCOPY ,WEDGE RESECTION LEFT LOWER LOBE LUNG,LEFT LOWER LOBECTOMY, & NODE SAMPLING  (Left)  Patient Location: PACU  Anesthesia Type: General   Level of Consciousness: awake, alert  and oriented  Airway and Oxygen Therapy: Patient Spontanous Breathing  Post-op Pain: mild  Post-op Assessment: Post-op Vital signs reviewed  Post-op Vital Signs: Reviewed  Last Vitals:  Filed Vitals:   09/10/13 1830  BP:   Pulse: 66  Temp:   Resp: 19    Complications: No apparent anesthesia complications

## 2013-09-10 NOTE — Anesthesia Preprocedure Evaluation (Addendum)
Anesthesia Evaluation  Patient identified by MRN, date of birth, ID band Patient awake    Reviewed: Allergy & Precautions, H&P , NPO status , Patient's Chart, lab work & pertinent test results  Airway       Dental  (+) Partial Upper, Teeth Intact, Poor Dentition, Dental Advisory Given   Pulmonary former smoker,          Cardiovascular hypertension, + Peripheral Vascular Disease Rhythm:Regular Rate:Normal     Neuro/Psych Anxiety Depression    GI/Hepatic   Endo/Other    Renal/GU      Musculoskeletal   Abdominal   Peds  Hematology   Anesthesia Other Findings Lung mass  Reproductive/Obstetrics                         Anesthesia Physical Anesthesia Plan  ASA: II  Anesthesia Plan: General   Post-op Pain Management:    Induction: Intravenous  Airway Management Planned: Double Lumen EBT  Additional Equipment: Arterial line and CVP  Intra-op Plan:   Post-operative Plan: Extubation in OR and Possible Post-op intubation/ventilation  Informed Consent: I have reviewed the patients History and Physical, chart, labs and discussed the procedure including the risks, benefits and alternatives for the proposed anesthesia with the patient or authorized representative who has indicated his/her understanding and acceptance.     Plan Discussed with: CRNA, Anesthesiologist and Surgeon  Anesthesia Plan Comments:         Anesthesia Quick Evaluation

## 2013-09-10 NOTE — Anesthesia Procedure Notes (Signed)
Procedure Name: Intubation Date/Time: 09/10/2013 1:47 PM Performed by: Sampson Si E Pre-anesthesia Checklist: Patient identified, Emergency Drugs available, Suction available, Patient being monitored and Timeout performed Patient Re-evaluated:Patient Re-evaluated prior to inductionOxygen Delivery Method: Circle system utilized Preoxygenation: Pre-oxygenation with 100% oxygen Intubation Type: IV induction Ventilation: Mask ventilation without difficulty Laryngoscope Size: Mac and 3 Grade View: Grade I Tube type: Oral Endobronchial tube: Double lumen EBT and Left and 39 Fr Number of attempts: 1 Airway Equipment and Method: Stylet and Fiberoptic brochoscope Placement Confirmation: ETT inserted through vocal cords under direct vision,  positive ETCO2 and breath sounds checked- equal and bilateral Secured at: 29 cm Tube secured with: Tape Dental Injury: Teeth and Oropharynx as per pre-operative assessment

## 2013-09-10 NOTE — Brief Op Note (Addendum)
      [address]       [place] [address]             [phone]     09/10/2013  4:40 PM  PATIENT:  Brandon Newton  72 y.o. male  PRE-OPERATIVE DIAGNOSIS:   Left lower lobe lung nodule  POST-OPERATIVE DIAGNOSIS:  Squamous cell carcinoma left lower lobe, clinical stage IA  PROCEDURE:  Procedure(s): LEFT VIDEO ASSISTED THORACOSCOPY  WEDGE RESECTION LEFT LOWER LOBE LUNG THORACOSCOPIC LEFT LOWER LOBECTOMY MEDIASTINAL LYMPH NODE SAMPLING  PLACEMENT OF ON-Q LOCAL ANESTHETIC CATHETER  SURGEON:  Surgeon(s): Melrose Nakayama, MD  PHYSICIAN ASSISTANT: WAYNE GOLD PA-C  ANESTHESIA:   general  SPECIMEN:  Source of Specimen:  LLL AND MULTIPLE LN'S  DISPOSITION OF SPECIMEN:  Pathology  DRAINS: 2 Chest Tube(s) in the LEFT HEMITHORAX   PATIENT CONDITION:  PACU - hemodynamically stable.  PRE-OPERATIVE WEIGHT: 19CK  COMPLICATIONS: NO KNOWN  Frozen of LLL mass- + squamous cell carcinoma, bronchial margin negative

## 2013-09-10 NOTE — Transfer of Care (Signed)
Immediate Anesthesia Transfer of Care Note  Patient: Brandon Newton  Procedure(s) Performed: Procedure(s): LEFT VIDEO ASSISTED THORACOSCOPY ,WEDGE RESECTION LEFT LOWER LOBE LUNG,LEFT LOWER LOBECTOMY, & NODE SAMPLING  (Left)  Patient Location: PACU  Anesthesia Type:General  Level of Consciousness: awake, alert  and oriented  Airway & Oxygen Therapy: Patient Spontanous Breathing and Patient connected to nasal cannula oxygen  Post-op Assessment: Report given to PACU RN and Patient moving all extremities X 4  Post vital signs: Reviewed and stable  Complications: No apparent anesthesia complications

## 2013-09-10 NOTE — Progress Notes (Signed)
Pt arrived from PACU-VSS- CT to  20 cm suction no air leak present. Daughter at bedside on arrival.

## 2013-09-11 ENCOUNTER — Inpatient Hospital Stay (HOSPITAL_COMMUNITY): Payer: BC Managed Care – PPO

## 2013-09-11 ENCOUNTER — Encounter (HOSPITAL_COMMUNITY): Payer: Self-pay | Admitting: *Deleted

## 2013-09-11 ENCOUNTER — Encounter: Payer: Self-pay | Admitting: Internal Medicine

## 2013-09-11 LAB — BLOOD GAS, ARTERIAL
Acid-Base Excess: 2.8 mmol/L — ABNORMAL HIGH (ref 0.0–2.0)
BICARBONATE: 27.3 meq/L — AB (ref 20.0–24.0)
DRAWN BY: 24513
O2 CONTENT: 3 L/min
O2 SAT: 97.2 %
PH ART: 7.403 (ref 7.350–7.450)
Patient temperature: 97.9
TCO2: 28.7 mmol/L (ref 0–100)
pCO2 arterial: 44.4 mmHg (ref 35.0–45.0)
pO2, Arterial: 89.9 mmHg (ref 80.0–100.0)

## 2013-09-11 LAB — BASIC METABOLIC PANEL
ANION GAP: 11 (ref 5–15)
BUN: 14 mg/dL (ref 6–23)
CO2: 26 mEq/L (ref 19–32)
Calcium: 8.1 mg/dL — ABNORMAL LOW (ref 8.4–10.5)
Chloride: 104 mEq/L (ref 96–112)
Creatinine, Ser: 0.93 mg/dL (ref 0.50–1.35)
GFR, EST NON AFRICAN AMERICAN: 83 mL/min — AB (ref >90)
Glucose, Bld: 130 mg/dL — ABNORMAL HIGH (ref 70–99)
Potassium: 3.7 mEq/L (ref 3.7–5.3)
SODIUM: 141 meq/L (ref 137–147)

## 2013-09-11 LAB — CBC
HEMATOCRIT: 34.6 % — AB (ref 39.0–52.0)
Hemoglobin: 12 g/dL — ABNORMAL LOW (ref 13.0–17.0)
MCH: 31.3 pg (ref 26.0–34.0)
MCHC: 34.7 g/dL (ref 30.0–36.0)
MCV: 90.1 fL (ref 78.0–100.0)
PLATELETS: 170 10*3/uL (ref 150–400)
RBC: 3.84 MIL/uL — ABNORMAL LOW (ref 4.22–5.81)
RDW: 13.5 % (ref 11.5–15.5)
WBC: 8.4 10*3/uL (ref 4.0–10.5)

## 2013-09-11 MED ORDER — IPRATROPIUM-ALBUTEROL 0.5-2.5 (3) MG/3ML IN SOLN
3.0000 mL | Freq: Four times a day (QID) | RESPIRATORY_TRACT | Status: DC
  Administered 2013-09-11 – 2013-09-13 (×9): 3 mL via RESPIRATORY_TRACT
  Filled 2013-09-11 (×9): qty 3

## 2013-09-11 MED ORDER — ALBUTEROL SULFATE (2.5 MG/3ML) 0.083% IN NEBU
2.5000 mg | INHALATION_SOLUTION | RESPIRATORY_TRACT | Status: DC | PRN
  Filled 2013-09-11: qty 3

## 2013-09-11 MED ORDER — ALBUTEROL SULFATE (2.5 MG/3ML) 0.083% IN NEBU: 2.5000 mg | INHALATION_SOLUTION | Freq: Four times a day (QID) | RESPIRATORY_TRACT | Status: DC

## 2013-09-11 MED ORDER — SODIUM CHLORIDE 0.9 % IJ SOLN: 10.0000 mL | INTRAMUSCULAR | Status: DC | PRN

## 2013-09-11 MED ORDER — SODIUM CHLORIDE 0.9 % IJ SOLN
10.0000 mL | Freq: Two times a day (BID) | INTRAMUSCULAR | Status: DC
  Administered 2013-09-11 – 2013-09-13 (×5): 10 mL
  Administered 2013-09-13: 20 mL
  Administered 2013-09-14 – 2013-09-15 (×2): 10 mL

## 2013-09-11 NOTE — Progress Notes (Addendum)
[address]       [place] [address]             [phone]          1 Day Post-Op Procedure(s) (LRB): LEFT VIDEO ASSISTED THORACOSCOPY ,WEDGE RESECTION LEFT LOWER LOBE LUNG,LEFT LOWER LOBECTOMY, & NODE SAMPLING  (Left)  Subjective: Comfortable, no complaints.   Objective: Vital signs in last 24 hours: Patient Vitals for the past 24 hrs:  BP Temp Temp src Pulse Resp SpO2 Height Weight  09/11/13 0853 - - - - - 97 % - -  09/11/13 0700 - 99.2 F (37.3 C) Oral - - - - -  09/11/13 0325 105/70 mmHg 97.9 F (36.6 C) Oral 86 23 97 % - -  09/10/13 2300 111/73 mmHg 98.4 F (36.9 C) Oral 84 16 96 % - -  09/10/13 1911 116/66 mmHg 98.2 F (36.8 C) Oral - - - 5\' 7"  (1.702 m) 186 lb (84.369 kg)  09/10/13 1845 - 98.1 F (36.7 C) - 72 18 96 % - -  09/10/13 1830 - - - 66 19 93 % - -  09/10/13 1815 - - - 68 18 95 % - -  09/10/13 1800 - - - 62 17 95 % - -  09/10/13 1745 - - - 61 18 93 % - -  09/10/13 1730 - - - 63 13 96 % - -  09/10/13 1715 121/73 mmHg - - 60 18 96 % - -  09/10/13 1700 - - - 62 16 96 % - -  09/10/13 1654 111/71 mmHg 98.9 F (37.2 C) - 66 17 94 % - -  09/10/13 1245 147/88 mmHg - - 74 13 96 % - -  09/10/13 1240 157/84 mmHg - - 80 14 97 % - -  09/10/13 1235 165/71 mmHg - - 79 18 93 % - -  09/10/13 1230 154/72 mmHg - - 71 13 97 % - -  09/10/13 1225 153/81 mmHg - - 80 17 96 % - -  09/10/13 1220 183/94 mmHg - - 82 17 97 % - -  09/10/13 1140 168/85 mmHg 98.2 F (36.8 C) Oral 79 20 96 % - 186 lb (84.369 kg)   Current Weight  09/10/13 186 lb (84.369 kg)     Intake/Output from previous day: 08/21 0701 - 08/22 0700 In: 3250 [I.V.:3250] Out: 1288 [Urine:875; Blood:100; Chest Tube:313]    PHYSICAL EXAM:  Heart: RRR Lungs: Exp wheezes bilaterally Wound: Dressed and dry Chest tube: intermittent 1/7 air leak     Lab Results: CBC: Recent Labs  09/08/13 1646 09/11/13 0437  WBC 7.8 8.4  HGB 15.0 12.0*  HCT 42.4 34.6*  PLT 218 170    BMET:  Recent Labs  09/08/13 1646 09/11/13 0437  NA 136* 141  K 3.8 3.7  CL 99 104  CO2 21 26  GLUCOSE 84 130*  BUN 16 14  CREATININE 1.02 0.93  CALCIUM 9.6 8.1*    PT/INR:  Recent Labs  09/08/13 1646  LABPROT 13.5  INR 1.03      Assessment/Plan: S/P Procedure(s) (LRB): LEFT VIDEO ASSISTED THORACOSCOPY ,WEDGE RESECTION LEFT LOWER LOBE LUNG,LEFT LOWER LOBECTOMY, & NODE SAMPLING  (Left) CXR stable, tiny intermittent air leak.  Will leave CTs to suction for now. Mobilize, start pulm toilet. BPs up overnight, home meds resumed and BPs better this am.  Continue to watch. Decrease IVF, leave Foley today for volume measurement, d/c a-line.  LOS: 1 day    COLLINS,GINA H 09/11/2013   Chart reviewed, patient examined, agree with above. I don't see any air leak. Will put to water seal. CXR looks good. Will dc a-line and keep foley. He needs IS.

## 2013-09-11 NOTE — Progress Notes (Signed)
Respiratory assessment done. Patient didn't want woke up at night, scored an 8 and changed to QID which continues the Q4 during the day and added a PRN for night time needs

## 2013-09-12 ENCOUNTER — Inpatient Hospital Stay (HOSPITAL_COMMUNITY): Payer: BC Managed Care – PPO

## 2013-09-12 LAB — COMPREHENSIVE METABOLIC PANEL
ALBUMIN: 3.1 g/dL — AB (ref 3.5–5.2)
ALK PHOS: 45 U/L (ref 39–117)
ALT: 8 U/L (ref 0–53)
AST: 11 U/L (ref 0–37)
Anion gap: 11 (ref 5–15)
BUN: 9 mg/dL (ref 6–23)
CHLORIDE: 99 meq/L (ref 96–112)
CO2: 28 mEq/L (ref 19–32)
Calcium: 8.4 mg/dL (ref 8.4–10.5)
Creatinine, Ser: 0.95 mg/dL (ref 0.50–1.35)
GFR calc Af Amer: >90 mL/min (ref >90)
GFR calc non Af Amer: 82 mL/min — ABNORMAL LOW (ref >90)
Glucose, Bld: 106 mg/dL — ABNORMAL HIGH (ref 70–99)
POTASSIUM: 3.3 meq/L — AB (ref 3.7–5.3)
Sodium: 138 mEq/L (ref 137–147)
TOTAL PROTEIN: 6.4 g/dL (ref 6.0–8.3)
Total Bilirubin: 0.7 mg/dL (ref 0.3–1.2)

## 2013-09-12 LAB — CBC
HCT: 36.6 % — ABNORMAL LOW (ref 39.0–52.0)
Hemoglobin: 12.4 g/dL — ABNORMAL LOW (ref 13.0–17.0)
MCH: 31 pg (ref 26.0–34.0)
MCHC: 33.9 g/dL (ref 30.0–36.0)
MCV: 91.5 fL (ref 78.0–100.0)
PLATELETS: 181 10*3/uL (ref 150–400)
RBC: 4 MIL/uL — ABNORMAL LOW (ref 4.22–5.81)
RDW: 13.8 % (ref 11.5–15.5)
WBC: 9.2 10*3/uL (ref 4.0–10.5)

## 2013-09-12 NOTE — Progress Notes (Signed)
Paged Dr. Cyndia Bent at [phone] regarding pt having a run of SVT. Awaiting call back.

## 2013-09-12 NOTE — Progress Notes (Signed)
D/C'd pt foley at 1000. Removed 79mL from balloon. Emptied 175 from bag. Pt tolerated well. Will continue to monitor.

## 2013-09-12 NOTE — Progress Notes (Addendum)
[address]       [place] [address]             [phone]          2 Days Post-Op Procedure(s) (LRB): LEFT VIDEO ASSISTED THORACOSCOPY ,WEDGE RESECTION LEFT LOWER LOBE LUNG,LEFT LOWER LOBECTOMY, & NODE SAMPLING  (Left)  Subjective: OOB in chair.  More comfortable today. Breathing stable. Still with poor appetite.   Objective: Vital signs in last 24 hours: Patient Vitals for the past 24 hrs:  BP Temp Temp src Pulse Resp SpO2  09/12/13 0351 - - - - 26 98 %  09/12/13 0333 125/74 mmHg 98.2 F (36.8 C) Oral 83 23 94 %  09/11/13 2322 - - - - 23 91 %  09/11/13 2308 121/77 mmHg 97.9 F (36.6 C) Oral 104 24 92 %  09/11/13 2214 - - - 88 18 94 %  09/11/13 2000 125/72 mmHg - - 94 21 93 %  09/11/13 1927 134/74 mmHg 98.3 F (36.8 C) Oral 97 21 94 %  09/11/13 1554 - - - - 18 91 %  09/11/13 1533 - - - - - 94 %  09/11/13 1508 - 98.6 F (37 C) Oral - - -  09/11/13 1421 130/73 mmHg - - 91 27 93 %  09/11/13 1244 - - - - 18 95 %  09/11/13 1201 - - - - - 94 %  09/11/13 1200 133/69 mmHg - - 80 26 95 %  09/11/13 1131 - 98.1 F (36.7 C) Oral - - -  09/11/13 0943 - - - - 20 96 %  09/11/13 0853 - - - - - 97 %  09/11/13 0800 137/75 mmHg - - 76 23 97 %   Current Weight  09/10/13 186 lb (84.369 kg)     Intake/Output from previous day: 08/22 0701 - 08/23 0700 In: 2005.4 [P.O.:480; I.V.:1425.4; IV Piggyback:100] Out: 3605 [Urine:3225; Chest Tube:380]    PHYSICAL EXAM:  Heart: RRR Lungs: Clear though slightly diminished BS in bases Wound: Dressed and dry Chest tube: No air leak    Lab Results: CBC: Recent Labs  09/11/13 0437 09/12/13 0245  WBC 8.4 9.2  HGB 12.0* 12.4*  HCT 34.6* 36.6*  PLT 170 181   BMET:  Recent Labs  09/11/13 0437 09/12/13 0245  NA 141 138  K 3.7 3.3*  CL 104 99  CO2 26 28  GLUCOSE 130* 106*  BUN 14 9  CREATININE 0.93 0.95  CALCIUM 8.1* 8.4    PT/INR: No results found for this basename: LABPROT, INR,  in the  last 72 hours  CXR: FINDINGS:  The lungs are borderline hypoinflated. The interstitial markings are  less conspicuous. There is no pneumothorax or significant pleural  effusion. The 2 left chest tubes are unchanged in position. The  right internal jugular venous catheter tip lies in the midportion of  the SVC. There is a small amount of fluid in the minor fissure on  the right. The cardiac silhouette remains mildly enlarged and its  margins are indistinct.  IMPRESSION:  There has been slight interval improvement in the appearance of the  pulmonary interstitium since yesterday's study    Assessment/Plan: S/P Procedure(s) (LRB): LEFT VIDEO ASSISTED THORACOSCOPY ,WEDGE RESECTION LEFT LOWER LOBE LUNG,LEFT LOWER LOBECTOMY, & NODE SAMPLING  (Left) CT with no air leak, CXR stable.  Hopefully can d/c a CT today, continue CTs to water seal. Hypokalemia- replace K+ per protocol.   Will d/c  Foley, decrease IVF to [facility], mobilize. Pulm- down to 1L O2.  Continue IS/pulm toilet. HTN- BPs improved on home meds.   LOS: 2 days    COLLINS,GINA H 09/12/2013   Chart reviewed, patient examined, agree with above. Will remove anterior chest tube today.

## 2013-09-12 NOTE — Progress Notes (Signed)
Dr. Cyndia Bent returned page at 1820. EKG done. No new orders received.

## 2013-09-13 ENCOUNTER — Encounter (HOSPITAL_COMMUNITY): Payer: Self-pay | Admitting: Thoracic Surgery (Cardiothoracic Vascular Surgery)

## 2013-09-13 ENCOUNTER — Inpatient Hospital Stay (HOSPITAL_COMMUNITY): Payer: BC Managed Care – PPO

## 2013-09-13 LAB — BASIC METABOLIC PANEL
ANION GAP: 10 (ref 5–15)
BUN: 11 mg/dL (ref 6–23)
CHLORIDE: 99 meq/L (ref 96–112)
CO2: 30 meq/L (ref 19–32)
CREATININE: 0.92 mg/dL (ref 0.50–1.35)
Calcium: 9.1 mg/dL (ref 8.4–10.5)
GFR calc non Af Amer: 83 mL/min — ABNORMAL LOW (ref >90)
Glucose, Bld: 94 mg/dL (ref 70–99)
POTASSIUM: 3.8 meq/L (ref 3.7–5.3)
Sodium: 139 mEq/L (ref 137–147)

## 2013-09-13 MED ORDER — METOPROLOL TARTRATE 25 MG PO TABS
25.0000 mg | ORAL_TABLET | Freq: Two times a day (BID) | ORAL | Status: DC
  Administered 2013-09-13 – 2013-09-16 (×7): 25 mg via ORAL
  Filled 2013-09-13 (×8): qty 1

## 2013-09-13 NOTE — Progress Notes (Signed)
3 Days Post-Op Procedure(s) (LRB): LEFT VIDEO ASSISTED THORACOSCOPY ,WEDGE RESECTION LEFT LOWER LOBE LUNG,LEFT LOWER LOBECTOMY, & NODE SAMPLING  (Left) Subjective: Postop LLL for CA CXR clear HR 110 with runs SVT-- start po lopressor No airleak, min output- DC chest tube Objective: Vital signs in last 24 hours: Temp:  [98.1 F (36.7 C)-98.8 F (37.1 C)] 98.8 F (37.1 C) (08/24 0700) Pulse Rate:  [85-100] 88 (08/24 0840) Cardiac Rhythm:  [-] Normal sinus rhythm (08/24 0840) Resp:  [18-25] 24 (08/24 0840) BP: (123-132)/(70-96) 129/77 mmHg (08/24 0840) SpO2:  [92 %-97 %] 96 % (08/24 0840) FiO2 (%):  [28 %] 28 % (08/23 1558)  Hemodynamic parameters for last 24 hours:  stable  Intake/Output from previous day: 08/23 0701 - 08/24 0700 In: 591.3 [P.O.:240; I.V.:351.3] Out: 745 [Urine:495; Chest Tube:250] Intake/Output this shift: Total I/O In: 10 [I.V.:10] Out: 110 [Chest Tube:110]  Lungs clear Nom air leak  Lab Results:  Recent Labs  09/11/13 0437 09/12/13 0245  WBC 8.4 9.2  HGB 12.0* 12.4*  HCT 34.6* 36.6*  PLT 170 181   BMET:  Recent Labs  09/12/13 0245 09/13/13 0545  NA 138 139  K 3.3* 3.8  CL 99 99  CO2 28 30  GLUCOSE 106* 94  BUN 9 11  CREATININE 0.95 0.92  CALCIUM 8.4 9.1    PT/INR: No results found for this basename: LABPROT, INR,  in the last 72 hours ABG    Component Value Date/Time   PHART 7.403 09/11/2013 0459   HCO3 27.3* 09/11/2013 0459   TCO2 28.7 09/11/2013 0459   O2SAT 97.2 09/11/2013 0459   CBG (last 3)  No results found for this basename: GLUCAP,  in the last 72 hours  Assessment/Plan: S/P Procedure(s) (LRB): LEFT VIDEO ASSISTED THORACOSCOPY ,WEDGE RESECTION LEFT LOWER LOBE LUNG,LEFT LOWER LOBECTOMY, & NODE SAMPLING  (Left) Keep in stepdown for atrial arrhytmia   LOS: 3 days    VAN TRIGT III,PETER 09/13/2013

## 2013-09-13 NOTE — Progress Notes (Signed)
Wasted 444mcg fentanyl in sink. Witnessed by [employer].

## 2013-09-13 NOTE — Progress Notes (Signed)
Utilization review completed.  

## 2013-09-14 ENCOUNTER — Inpatient Hospital Stay (HOSPITAL_COMMUNITY): Payer: BC Managed Care – PPO

## 2013-09-14 ENCOUNTER — Ambulatory Visit: Payer: Self-pay | Admitting: Internal Medicine

## 2013-09-14 LAB — CBC
HCT: 37.9 % — ABNORMAL LOW (ref 39.0–52.0)
Hemoglobin: 12.9 g/dL — ABNORMAL LOW (ref 13.0–17.0)
MCH: 31.2 pg (ref 26.0–34.0)
MCHC: 34 g/dL (ref 30.0–36.0)
MCV: 91.5 fL (ref 78.0–100.0)
Platelets: 226 10*3/uL (ref 150–400)
RBC: 4.14 MIL/uL — ABNORMAL LOW (ref 4.22–5.81)
RDW: 13.8 % (ref 11.5–15.5)
WBC: 9.4 10*3/uL (ref 4.0–10.5)

## 2013-09-14 LAB — BASIC METABOLIC PANEL
Anion gap: 9 (ref 5–15)
BUN: 18 mg/dL (ref 6–23)
CO2: 31 mEq/L (ref 19–32)
Calcium: 9.3 mg/dL (ref 8.4–10.5)
Chloride: 96 mEq/L (ref 96–112)
Creatinine, Ser: 1.02 mg/dL (ref 0.50–1.35)
GFR calc Af Amer: 83 mL/min — ABNORMAL LOW (ref >90)
GFR calc non Af Amer: 72 mL/min — ABNORMAL LOW (ref >90)
Glucose, Bld: 88 mg/dL (ref 70–99)
Potassium: 4 mEq/L (ref 3.7–5.3)
Sodium: 136 mEq/L — ABNORMAL LOW (ref 137–147)

## 2013-09-14 MED ORDER — AMIODARONE LOAD VIA INFUSION
150.0000 mg | Freq: Once | INTRAVENOUS | Status: AC
  Administered 2013-09-14: 150 mg via INTRAVENOUS
  Filled 2013-09-14: qty 83.34

## 2013-09-14 MED ORDER — AMIODARONE HCL IN DEXTROSE 360-4.14 MG/200ML-% IV SOLN
60.0000 mg/h | INTRAVENOUS | Status: AC
  Administered 2013-09-14 (×2): 60 mg/h via INTRAVENOUS
  Filled 2013-09-14: qty 200

## 2013-09-14 MED ORDER — AMIODARONE HCL IN DEXTROSE 360-4.14 MG/200ML-% IV SOLN
30.0000 mg/h | INTRAVENOUS | Status: AC
  Administered 2013-09-14 – 2013-09-15 (×2): 30 mg/h via INTRAVENOUS
  Filled 2013-09-14 (×2): qty 200

## 2013-09-14 MED ORDER — AMIODARONE HCL IN DEXTROSE 360-4.14 MG/200ML-% IV SOLN
INTRAVENOUS | Status: AC
  Administered 2013-09-14: 60 mg/h via INTRAVENOUS
  Filled 2013-09-14: qty 200

## 2013-09-14 MED ORDER — AMIODARONE IV BOLUS ONLY 150 MG/100ML
150.0000 mg | Freq: Once | INTRAVENOUS | Status: AC
  Administered 2013-09-14: 150 mg via INTRAVENOUS
  Filled 2013-09-14: qty 100

## 2013-09-14 MED ORDER — ALBUMIN HUMAN 5 % IV SOLN
12.5000 g | Freq: Once | INTRAVENOUS | Status: AC
  Administered 2013-09-14: 12.5 g via INTRAVENOUS

## 2013-09-14 NOTE — Progress Notes (Addendum)
       [address]       [place] [address]             [phone]          4 Days Post-Op Procedure(s) (LRB): LEFT VIDEO ASSISTED THORACOSCOPY ,WEDGE RESECTION LEFT LOWER LOBE LUNG,LEFT LOWER LOBECTOMY, & NODE SAMPLING  (Left)  Subjective: Just back from walking in hall.  No complaints.   Objective: Vital signs in last 24 hours: Patient Vitals for the past 24 hrs:  BP Temp Temp src Pulse Resp SpO2  09/14/13 0725 110/70 mmHg - - 63 21 93 %  09/14/13 0700 - 98 F (36.7 C) Oral - - -  09/14/13 0318 113/73 mmHg 97.9 F (36.6 C) Oral 64 23 92 %  09/13/13 2320 121/77 mmHg 98.8 F (37.1 C) Oral 64 23 91 %  09/13/13 2150 121/81 mmHg - - 75 - -  09/13/13 1924 111/66 mmHg 98.5 F (36.9 C) Oral 69 20 91 %  09/13/13 1540 132/68 mmHg 98.6 F (37 C) Oral 72 24 91 %  09/13/13 1135 139/82 mmHg 99.2 F (37.3 C) Oral 90 18 94 %   Current Weight  09/10/13 186 lb (84.369 kg)     Intake/Output from previous day: 08/24 0701 - 08/25 0700 In: 480 [P.O.:240; I.V.:240] Out: 110 [Chest Tube:110]    PHYSICAL EXAM:  Heart: RRR Lungs: Slightly decreased BS in bases Wound: Clean and dry    Lab Results: CBC: Recent Labs  09/12/13 0245 09/14/13 0325  WBC 9.2 9.4  HGB 12.4* 12.9*  HCT 36.6* 37.9*  PLT 181 226   BMET:  Recent Labs  09/13/13 0545 09/14/13 0325  NA 139 136*  K 3.8 4.0  CL 99 96  CO2 30 31  GLUCOSE 94 88  BUN 11 18  CREATININE 0.92 1.02  CALCIUM 9.1 9.3    PT/INR: No results found for this basename: LABPROT, INR,  in the last 72 hours  TWS:FKCLEXNT:  Chest tube is been removed on the left. There is a small left apical pneumothorax. No tension component. There is a small left effusion with left base atelectatic change. Right lung is clear. Central catheter tip is in the superior vena cava. Heart size and pulmonary vascularity are normal. There is a previously noted fracture of the left clavicle, stable.  IMPRESSION:  Small left  apical pneumothorax post chest tube removal. No tension component. There is a small left effusion with left base  atelectasis. Right lung clear. No change in central catheter  placement position. Old left clavicle fracture.    Assessment/Plan: S/P Procedure(s) (LRB): LEFT VIDEO ASSISTED THORACOSCOPY ,WEDGE RESECTION LEFT LOWER LOBE LUNG,LEFT LOWER LOBECTOMY, & NODE SAMPLING  (Left) Pulm- stable, off oxygen.  Continue pulm toilet, IS. CV- no further SVT.  Continue Lopressor. Tx 2W. Plan home in am if remains stable.   LOS: 4 days    COLLINS,GINA H 09/14/2013  patient examined and medical record reviewed,agree with above note. Keep patient in hospital one more day to monitor HR on  Beta blocker VAN TRIGT III,PETER 09/14/2013

## 2013-09-14 NOTE — Progress Notes (Addendum)
CRITICAL VALUE ALERT  Critical value received:  Small Left apical pneumo  Date of notification:  09/14/2013  Time of notification:  0806  Critical value read back:Yes.    Nurse who received alert:  Sophronia Simas  MD notified (1st page):  Servando Snare  Time of first page:  0810  MD notified (2nd page):  Time of second page:  Responding MD:  Servando Snare  Time MD responded:  [phone]

## 2013-09-14 NOTE — Discharge Summary (Signed)
[address]       [place] [address]             [phone]              Discharge Summary  Name: Brandon Newton DOB: [birth year] 73 y.o. MRN: [id number]   Admission Date: 09/10/2013 Discharge Date: 09/16/2013    Admitting Diagnosis: Left lower lobe lung mass   Discharge Diagnosis:  Invasive adenocarcinoma, left lower lobe (Stage IB- T2a, N0) Postoperative supraventricular tachycardia Postoperative atrial fibrillation  Past Medical History  Diagnosis Date  . Anxiety   . Hypertension   . Cancer     skin  . Depression      Procedures: LEFT VIDEO ASSISTED THORACOSCOPY  -  09/10/2013  WEDGE RESECTION LEFT LOWER LOBE  THORACOSCOPIC LEFT LOWER LOBECTOMY  LYMPH NODE SAMPLING     HPI:  The patient is a 73 y.o. male originally from [place]. He has a remote history of tobacco abuse, smoking about one half a pack a day for 30 years prior to quitting 15 years ago. He saw Dr. Alain Marion in June for the first time in several years. His daughter had noticed that he was less active and more short of breath. The patient denies any significant shortness of breath, but his daughter thought he was very tired when mowing his yard, which is about an acre. He previously was able to do that without issue. A chest x-ray was done which showed a concern for a 3 x 2 cm left lower lobe mass. A CT of the chest subsequently confirmed a 2.8 x 2.1 cm left lower lobe mass. He saw Dr. Melvyn Novas. A PET CT was done and the mass was hypermetabolic with an SUV of 67.6. There is no suspicious hilar or mediastinal adenopathy. The patient has lost 4 pounds over the past 3 months. He denies cough or hemoptysis. He can walk up a flight of stairs without hesitation or shortness of breath. He denies any chest pain, pressure, or tightness. He has no history of coronary disease but did have coronary calcification on his CT scan. He also was recently diagnosed with hypertension. His father  had lung cancer.  The patient was referred to Dr. Roxan Hockey for thoracic surgical consideration.  It was felt that he would benefit from surgical resection at this time. All risks, benefits and alternatives of surgery were explained in detail, and the patient agreed to proceed.    Hospital Course:  The patient was admitted to [facility] on 09/10/2013. The patient was taken to the operating room and underwent the above procedure.    The postoperative course has generally been uneventful.  He did develop no-sustained supraventricular tachycardia and was started on a low dose beta blocker.  He remained stable initially, but then developed rapid atrial fibrillation.  The patient was started on IV Amiodarone and ultimately converted to sinus rhythm.  His rhythm has remained stable and he has been switched to oral Amiodarone.  His chest tubes have been removed in the standard fashion, and follow up chest x-rays have shown a stable small left apical pneumothorax.  He has been ambulating in the halls without difficulty and is tolerating a regular diet.  Incisions are healing well.  He has been weaned from supplemental oxygen.  Final pathology revealed invasive adenocarcinoma, T2a, N0.   The patient is overall doing well, and is currently medically stable for discharge home.     Recent vital signs:  Filed Vitals:   09/16/13 0700  BP:   Pulse:   Temp: 98.4 F (36.9 C)  Resp:     Recent laboratory studies:  CBC:  Recent Labs  09/14/13 0325  WBC 9.4  HGB 12.9*  HCT 37.9*  PLT 226   BMET:   Recent Labs  09/14/13 0325  NA 136*  K 4.0  CL 96  CO2 31  GLUCOSE 88  BUN 18  CREATININE 1.02  CALCIUM 9.3    PT/INR: No results found for this basename: LABPROT, INR,  in the last 72 hours   Discharge Medications:     Medication List         amiodarone 400 MG tablet  Commonly known as:  PACERONE  Take 400 mg po bid x 1 week, then decrease to 200 mg po bid     aspirin EC 81 MG tablet   Take 1 tablet (81 mg total) by mouth daily.     cholecalciferol 1000 UNITS tablet  Commonly known as:  VITAMIN D  Take 1 tablet (1,000 Units total) by mouth daily.     escitalopram 10 MG tablet  Commonly known as:  LEXAPRO  Take 5 mg by mouth daily.     FLUoxetine 10 MG capsule  Commonly known as:  PROZAC  Take 10 mg by mouth daily.     metoprolol tartrate 25 MG tablet  Commonly known as:  LOPRESSOR  Take 1 tablet (25 mg total) by mouth 2 (two) times daily.     SYSTANE ULTRA OP  Place 1 drop into both eyes daily as needed (for dry eyes).     traMADol 50 MG tablet  Commonly known as:  ULTRAM  Take 1-2 tablets (50-100 mg total) by mouth every 6 (six) hours as needed for moderate pain.     triamterene-hydrochlorothiazide 37.5-25 MG per tablet  Commonly known as:  MAXZIDE-25  Take 1 tablet by mouth daily. qam     vitamin B-12 500 MCG tablet  Commonly known as:  CYANOCOBALAMIN  Take 500 mcg by mouth daily.         Discharge Instructions:  The patient is to refrain from driving, heavy lifting or strenuous activity.  May shower daily and clean incisions with soap and water.  May resume regular diet.   Follow Up:    Follow-up Information   Follow up with Melrose Nakayama, MD On 10/05/2013. (Have a chest x-ray at [place] at 12:00, then see MD at 1:00)    Specialty:  Cardiothoracic Surgery   Contact information:   [address] [place] [address] [phone]       Follow up with [facility] NUR[facility] On 09/21/2013. (For suture removal with Dr. Leonarda Salon nurse at 10:00)        [place] 09/16/2013, 8:26 AM

## 2013-09-14 NOTE — Progress Notes (Signed)
  Amiodarone Drug - Drug Interaction Consult Note  Recommendations: none Amiodarone is metabolized by the cytochrome P450 system and therefore has the potential to cause many drug interactions. Amiodarone has an average plasma half-life of 50 days (range 20 to 100 days).   There is potential for drug interactions to occur several weeks or months after stopping treatment and the onset of drug interactions may be slow after initiating amiodarone.   []  Statins: Increased risk of myopathy. Simvastatin- restrict dose to 20mg  daily. Other statins: counsel patients to report any muscle pain or weakness immediately.  []  Anticoagulants: Amiodarone can increase anticoagulant effect. Consider warfarin dose reduction. Patients should be monitored closely and the dose of anticoagulant altered accordingly, remembering that amiodarone levels take several weeks to stabilize.  []  Antiepileptics: Amiodarone can increase plasma concentration of phenytoin, the dose should be reduced. Note that small changes in phenytoin dose can result in large changes in levels. Monitor patient and counsel on signs of toxicity.  []  Beta blockers: increased risk of bradycardia, AV block and myocardial depression. Sotalol - avoid concomitant use.  []   Calcium channel blockers (diltiazem and verapamil): increased risk of bradycardia, AV block and myocardial depression.  []   Cyclosporine: Amiodarone increases levels of cyclosporine. Reduced dose of cyclosporine is recommended.  []  Digoxin dose should be halved when amiodarone is started.  []  Diuretics: increased risk of cardiotoxicity if hypokalemia occurs.  []  Oral hypoglycemic agents (glyburide, glipizide, glimepiride): increased risk of hypoglycemia. Patient's glucose levels should be monitored closely when initiating amiodarone therapy.   []  Drugs that prolong the QT interval:  Torsades de pointes risk may be increased with concurrent use - avoid if possible.  Monitor QTc,  also keep magnesium/potassium WNL if concurrent therapy can't be avoided. Marland Kitchen Antibiotics: e.g. fluoroquinolones, erythromycin. . Antiarrhythmics: e.g. quinidine, procainamide, disopyramide, sotalol. . Antipsychotics: e.g. phenothiazines, haloperidol.  . Lithium, tricyclic antidepressants, and methadone. Thank You,  Leodis Sias T  09/14/2013 3:01 PM

## 2013-09-14 NOTE — Progress Notes (Signed)
      [address]       [place] [address]             [phone]       Pt now in rapid atrial fib with HR 150s and SBP 110s.  Will start Amiodarone per protocol and hold transfer to floor for now.   Suzzanne Cloud, PA-C 2:46 pm

## 2013-09-15 ENCOUNTER — Inpatient Hospital Stay (HOSPITAL_COMMUNITY): Payer: BC Managed Care – PPO

## 2013-09-15 DIAGNOSIS — I4891 Unspecified atrial fibrillation: Secondary | ICD-10-CM

## 2013-09-15 MED ORDER — AMIODARONE HCL 200 MG PO TABS
400.0000 mg | ORAL_TABLET | Freq: Two times a day (BID) | ORAL | Status: DC
  Administered 2013-09-15 – 2013-09-16 (×3): 400 mg via ORAL
  Filled 2013-09-15 (×4): qty 2

## 2013-09-15 NOTE — Progress Notes (Signed)
HR varying from 120-130s. BP 90-100/60-70s. SpO2 mid 90s. On amio gtt at 60mg /hr. Complaining of shortness of breath and being hot. Patient placed on [facility]. A-febrile. No visible perspiration. Dr. Lianne Bushy notified. New orders received. Family at bedside, updated. Will continue to monitor.   Lum Babe, RN

## 2013-09-15 NOTE — Progress Notes (Signed)
Patient converted from Atrial Fibrillation to NSR on amio drip at 0721. Heart rate is currently 65. Patient reports feeling fine. Will continue to monitor. Brandon Newton

## 2013-09-15 NOTE — Progress Notes (Signed)
5 Days Post-Op Procedure(s) (LRB): LEFT VIDEO ASSISTED THORACOSCOPY ,WEDGE RESECTION LEFT LOWER LOBE LUNG,LEFT LOWER LOBECTOMY, & NODE SAMPLING  (Left) Subjective: S/p L VATS, lobectomy CXR clear Now nsr after rapid a-fib last pm Will convert iv to po amiodarone  Objective: Vital signs in last 24 hours: Temp:  [97.7 F (36.5 C)-98.4 F (36.9 C)] 97.7 F (36.5 C) (08/26 0300) Pulse Rate:  [47-130] 101 (08/26 0600) Cardiac Rhythm:  [-] Atrial fibrillation (08/26 0300) Resp:  [16-29] 19 (08/26 0600) BP: (90-136)/(56-83) 96/71 mmHg (08/26 0600) SpO2:  [90 %-98 %] 98 % (08/26 0600)  Hemodynamic parameters for last 24 hours:  stable  Intake/Output from previous day: 08/25 0701 - 08/26 0700 In: 368 [I.V.:368] Out: 925 [Urine:925] Intake/Output this shift:   Exam Lungs clear Incisions clean No edema   Lab Results:  Recent Labs  09/14/13 0325  WBC 9.4  HGB 12.9*  HCT 37.9*  PLT 226   BMET:  Recent Labs  09/13/13 0545 09/14/13 0325  NA 139 136*  K 3.8 4.0  CL 99 96  CO2 30 31  GLUCOSE 94 88  BUN 11 18  CREATININE 0.92 1.02  CALCIUM 9.1 9.3    PT/INR: No results found for this basename: LABPROT, INR,  in the last 72 hours ABG    Component Value Date/Time   PHART 7.403 09/11/2013 0459   HCO3 27.3* 09/11/2013 0459   TCO2 28.7 09/11/2013 0459   O2SAT 97.2 09/11/2013 0459   CBG (last 3)  No results found for this basename: GLUCAP,  in the last 72 hours  Assessment/Plan: S/P Procedure(s) (LRB): LEFT VIDEO ASSISTED THORACOSCOPY ,WEDGE RESECTION LEFT LOWER LOBE LUNG,LEFT LOWER LOBECTOMY, & NODE SAMPLING  (Left)  Keep in srepdown due to a-fib Start po amiodarone Possible DC in am on amiodarone and lopressor if NSR maintained   LOS: 5 days    VAN TRIGT III,Jasara Corrigan 09/15/2013

## 2013-09-16 MED ORDER — AMIODARONE HCL 400 MG PO TABS: ORAL_TABLET | ORAL | Status: DC

## 2013-09-16 MED ORDER — METOPROLOL TARTRATE 25 MG PO TABS: 25.0000 mg | ORAL_TABLET | Freq: Two times a day (BID) | ORAL | Status: DC

## 2013-09-16 MED ORDER — TRAMADOL HCL 50 MG PO TABS: 50.0000 mg | ORAL_TABLET | Freq: Four times a day (QID) | ORAL | Status: DC | PRN

## 2013-09-16 NOTE — Progress Notes (Signed)
       [address]       [place] [address]             [phone]          6 Days Post-Op Procedure(s) (LRB): LEFT VIDEO ASSISTED THORACOSCOPY ,WEDGE RESECTION LEFT LOWER LOBE LUNG,LEFT LOWER LOBECTOMY, & NODE SAMPLING  (Left)  Subjective: Stable night, rested well.  No further AF.   Objective: Vital signs in last 24 hours: Patient Vitals for the past 24 hrs:  BP Temp Temp src Pulse Resp SpO2  09/16/13 0508 109/62 mmHg 98.6 F (37 C) Oral 64 22 92 %  09/16/13 0032 113/72 mmHg 97.8 F (36.6 C) Oral 69 23 94 %  09/15/13 1931 129/73 mmHg 98 F (36.7 C) Oral 74 20 95 %  09/15/13 1900 136/80 mmHg - - 72 14 94 %  09/15/13 1800 123/65 mmHg - - 62 18 93 %  09/15/13 1700 121/66 mmHg - - 63 25 93 %  09/15/13 1600 104/69 mmHg - - 64 20 94 %  09/15/13 1503 114/75 mmHg 98 F (36.7 C) Oral 74 17 96 %  09/15/13 1500 - - - - 26 -  09/15/13 1400 112/69 mmHg - - 61 12 95 %  09/15/13 1300 - - - 60 17 95 %  09/15/13 1200 107/66 mmHg 98.2 F (36.8 C) Oral 58 24 94 %  09/15/13 1100 107/68 mmHg - - 59 22 93 %  09/15/13 1000 - - - - 27 -  09/15/13 0900 97/65 mmHg - - 68 24 94 %  09/15/13 0815 - 98 F (36.7 C) Oral - - -   Current Weight  09/10/13 186 lb (84.369 kg)     Intake/Output from previous day: 08/26 0701 - 08/27 0700 In: 643.5 [P.O.:560; I.V.:83.5] Out: -     PHYSICAL EXAM:  Heart: RRR Lungs: Slightly decreased BS in bases Wound: Clean and dry    Lab Results: CBC: Recent Labs  09/14/13 0325  WBC 9.4  HGB 12.9*  HCT 37.9*  PLT 226   BMET:  Recent Labs  09/14/13 0325  NA 136*  K 4.0  CL 96  CO2 31  GLUCOSE 88  BUN 18  CREATININE 1.02  CALCIUM 9.3    PT/INR: No results found for this basename: LABPROT, INR,  in the last 72 hours    Assessment/Plan: S/P Procedure(s) (LRB): LEFT VIDEO ASSISTED THORACOSCOPY ,WEDGE RESECTION LEFT LOWER LOBE LUNG,LEFT LOWER LOBECTOMY, & NODE SAMPLING  (Left) Pulm- Off O2, sats stable. CV-  Maintaining SR, BPs controlled.  Continue current meds. Plan d/c home today with daughter.   LOS: 6 days    COLLINS,GINA H 09/16/2013

## 2013-09-16 NOTE — Care Management Note (Signed)
    Page 1 of 1   09/16/2013     11:21:37 AM CARE MANAGEMENT NOTE 09/16/2013  Patient:  Brandon Newton,Brandon Newton   Account Number:  [id number]  Date Initiated:  09/13/2013  Documentation initiated by:  Marvetta Gibbons  Subjective/Objective Assessment:   Pt admitted s/p VATS     Action/Plan:   PTA pt lived at home   Anticipated DC Date:  09/16/2013   Anticipated DC Plan:  HOME/SELF CARE         Choice offered to / List presented to:             Status of service:  Completed, signed off Medicare Important Message given?  NO (If response is "NO", the following Medicare IM given date fields will be blank) Date Medicare IM given:   Medicare IM given by:   Date Additional Medicare IM given:   Additional Medicare IM given by:    Discharge Disposition:  HOME/SELF CARE  Per UR Regulation:  Reviewed for med. necessity/level of care/duration of stay  If discussed at Center of Stay Meetings, dates discussed:   09/16/2013    Comments:  Medicare IM- N/A

## 2013-09-16 NOTE — Progress Notes (Signed)
Pt d/c home per MD order, d/c instruction reviewed with daughter and pt, daughter reinforced in native language, pt VSS, pt and family verbalized understanding of d/c all questions answered

## 2013-09-21 ENCOUNTER — Ambulatory Visit (INDEPENDENT_AMBULATORY_CARE_PROVIDER_SITE_OTHER): Payer: Self-pay

## 2013-09-21 DIAGNOSIS — C3430 Malignant neoplasm of lower lobe, unspecified bronchus or lung: Secondary | ICD-10-CM

## 2013-09-21 DIAGNOSIS — Z4802 Encounter for removal of sutures: Secondary | ICD-10-CM

## 2013-09-21 NOTE — Progress Notes (Signed)
Pt was received to clinic to have his sutures removed. Pt tolerated well the removal.No redness/ No hotness to touch.Skin healing well

## 2013-09-22 ENCOUNTER — Encounter: Payer: Self-pay | Admitting: Internal Medicine

## 2013-09-29 ENCOUNTER — Ambulatory Visit (INDEPENDENT_AMBULATORY_CARE_PROVIDER_SITE_OTHER): Payer: BC Managed Care – PPO | Admitting: Internal Medicine

## 2013-09-29 ENCOUNTER — Encounter: Payer: Self-pay | Admitting: Internal Medicine

## 2013-09-29 VITALS — BP 160/90 | HR 60 | Temp 98.0°F | Resp 16 | Wt 179.0 lb

## 2013-09-29 DIAGNOSIS — C3490 Malignant neoplasm of unspecified part of unspecified bronchus or lung: Secondary | ICD-10-CM

## 2013-09-29 DIAGNOSIS — I10 Essential (primary) hypertension: Secondary | ICD-10-CM

## 2013-09-29 DIAGNOSIS — F3289 Other specified depressive episodes: Secondary | ICD-10-CM

## 2013-09-29 DIAGNOSIS — G4700 Insomnia, unspecified: Secondary | ICD-10-CM

## 2013-09-29 DIAGNOSIS — H6120 Impacted cerumen, unspecified ear: Secondary | ICD-10-CM

## 2013-09-29 DIAGNOSIS — R5381 Other malaise: Secondary | ICD-10-CM

## 2013-09-29 DIAGNOSIS — C3492 Malignant neoplasm of unspecified part of left bronchus or lung: Secondary | ICD-10-CM

## 2013-09-29 DIAGNOSIS — R0609 Other forms of dyspnea: Secondary | ICD-10-CM

## 2013-09-29 DIAGNOSIS — Z23 Encounter for immunization: Secondary | ICD-10-CM

## 2013-09-29 DIAGNOSIS — R0989 Other specified symptoms and signs involving the circulatory and respiratory systems: Secondary | ICD-10-CM

## 2013-09-29 DIAGNOSIS — R0600 Dyspnea, unspecified: Secondary | ICD-10-CM

## 2013-09-29 DIAGNOSIS — F329 Major depressive disorder, single episode, unspecified: Secondary | ICD-10-CM

## 2013-09-29 DIAGNOSIS — R5383 Other fatigue: Secondary | ICD-10-CM

## 2013-09-29 DIAGNOSIS — H6123 Impacted cerumen, bilateral: Secondary | ICD-10-CM

## 2013-09-29 DIAGNOSIS — Z902 Acquired absence of lung [part of]: Secondary | ICD-10-CM

## 2013-09-29 DIAGNOSIS — I4891 Unspecified atrial fibrillation: Secondary | ICD-10-CM

## 2013-09-29 DIAGNOSIS — Z9889 Other specified postprocedural states: Secondary | ICD-10-CM

## 2013-09-29 MED ORDER — METOPROLOL TARTRATE 25 MG PO TABS: 12.5000 mg | ORAL_TABLET | Freq: Two times a day (BID) | ORAL | Status: DC

## 2013-09-29 MED ORDER — LORAZEPAM 0.5 MG PO TABS: 0.5000 mg | ORAL_TABLET | Freq: Two times a day (BID) | ORAL | Status: DC | PRN

## 2013-09-29 MED ORDER — AMIODARONE HCL 400 MG PO TABS: 200.0000 mg | ORAL_TABLET | Freq: Every day | ORAL | Status: DC

## 2013-09-29 MED ORDER — AMIODARONE HCL 200 MG PO TABS: 200.0000 mg | ORAL_TABLET | Freq: Every day | ORAL | Status: DC

## 2013-09-29 NOTE — Assessment & Plan Note (Signed)
9/15 post-op Resolved EKG - S brady. Start Amiodarone 1 bid Metoprolol 1/2 bid

## 2013-09-29 NOTE — Assessment & Plan Note (Signed)
Start low dose Lorazepam prn  Potential benefits of a long term prn benzodiazepines  use as well as potential risks  and complications were explained to the patient and were aknowledged.

## 2013-09-29 NOTE — Assessment & Plan Note (Signed)
Multifactorial: Reduce Metoprolol, reduce Amiodarone Treat insomnia

## 2013-09-29 NOTE — Assessment & Plan Note (Signed)
CL stress test - ok (9/15)

## 2013-09-29 NOTE — Assessment & Plan Note (Signed)
Doing well 

## 2013-09-29 NOTE — Progress Notes (Signed)
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 Negative for chills, appetite change and unexpected weight change.  HENT: Positive for hearing loss. Negative for congestion, nosebleeds, sneezing, sore throat and trouble swallowing.   Eyes: Negative for itching and visual disturbance.  Respiratory: Negative for cough, shortness of breath and wheezing.   Cardiovascular: Negative for chest pain, palpitations and leg swelling.  Gastrointestinal: Negative for nausea, vomiting, diarrhea, blood in stool and abdominal distention.  Genitourinary: Negative for frequency and hematuria.  Musculoskeletal: Negative for back pain, gait problem, joint swelling and neck pain.  Skin: Negative for rash.  Allergic/Immunologic: Negative for food allergies.  Neurological: Negative for dizziness, tremors, speech difficulty and weakness.  Psychiatric/Behavioral: Positive for sleep disturbance. Negative for suicidal ideas, dysphoric mood and agitation. The patient is nervous/anxious.        Objective:   Physical Exam  Constitutional: He is oriented to person, place, and time. He appears well-developed. No distress.  NAD  HENT:  Mouth/Throat: Oropharynx is clear and moist.  Eyes: Conjunctivae are normal. Pupils are equal, round, and reactive to light.  Neck: Normal range of motion. No JVD present. No thyromegaly present.  Cardiovascular: Normal rate, regular rhythm, normal heart sounds and intact distal pulses.  Exam reveals no gallop and no friction rub.   No murmur heard. Pulmonary/Chest: Effort normal and breath sounds normal. No respiratory distress. He has no wheezes. He has no rales. He exhibits no tenderness.  Abdominal: Soft. Bowel sounds are normal. He exhibits no distension and no mass. There is no  tenderness. There is no rebound and no guarding.  Musculoskeletal: Normal range of motion. He exhibits no edema and no tenderness.  Lymphadenopathy:    He has no cervical adenopathy.  Neurological: He is alert and oriented to person, place, and time. He has normal reflexes. No cranial nerve deficit. He exhibits normal muscle tone. He displays a negative Romberg sign. Coordination and gait normal.  No meningeal signs  Skin: Skin is warm and dry. No rash noted.  Psychiatric: He has a normal mood and affect. His behavior is normal. Judgment and thought content normal.  wax B L chest scar healing  EKG S brady  Lab Results  Component Value Date   WBC 9.4 09/14/2013   HGB 12.9* 09/14/2013   HCT 37.9* 09/14/2013   PLT 226 09/14/2013   GLUCOSE 88 09/14/2013   ALT 8 09/12/2013   AST 11 09/12/2013   NA 136* 09/14/2013   K 4.0 09/14/2013   CL 96 09/14/2013   CREATININE 1.02 09/14/2013   BUN 18 09/14/2013   CO2 31 09/14/2013   TSH 3.12 07/15/2013   PSA 5.69* 07/15/2013   INR 1.03 09/08/2013   Hospital notes reviewed A complex case   Procedure Note :     Procedure :  Ear irrigation   Indication:  Cerumen impaction B   Risks, including pain, dizziness, eardrum perforation, bleeding, infection and others as well as benefits were explained to the patient in detail. Verbal consent was obtained and the patient agreed to proceed.    We used "The Elephant Ear Irrigation Device" filled with lukewarm water for irrigation. A large amount wax was recovered. Procedure has also required manual wax removal with an ear loop.  Tolerated well. Complications: None. L ear hearing was restored   Postprocedure instructions :  Call if problems.      Assessment & Plan:

## 2013-09-29 NOTE — Assessment & Plan Note (Signed)
Summer 2015 L lung - s/p resection 9/15  Plans: f/u w/Dr Roxan Hockey

## 2013-09-29 NOTE — Assessment & Plan Note (Signed)
Better Continue with current prescription therapy as reflected on the Med list.  

## 2013-09-29 NOTE — Assessment & Plan Note (Signed)
See meds He has a white coat HTN component - discussed

## 2013-09-29 NOTE — Assessment & Plan Note (Signed)
Will irrigate 

## 2013-09-29 NOTE — Progress Notes (Deleted)
Pre visit review using our clinic review tool, if applicable. No additional management support is needed unless otherwise documented below in the visit note. 

## 2013-10-04 ENCOUNTER — Other Ambulatory Visit: Payer: Self-pay | Admitting: Thoracic Surgery (Cardiothoracic Vascular Surgery)

## 2013-10-04 DIAGNOSIS — C3490 Malignant neoplasm of unspecified part of unspecified bronchus or lung: Secondary | ICD-10-CM

## 2013-10-05 ENCOUNTER — Ambulatory Visit (INDEPENDENT_AMBULATORY_CARE_PROVIDER_SITE_OTHER): Payer: Self-pay | Admitting: Thoracic Surgery (Cardiothoracic Vascular Surgery)

## 2013-10-05 ENCOUNTER — Ambulatory Visit
Admission: RE | Admit: 2013-10-05 | Discharge: 2013-10-05 | Disposition: A | Discharge status: Home / Self Care | Payer: BC Managed Care – PPO | Source: Ambulatory Visit | Attending: Thoracic Surgery (Cardiothoracic Vascular Surgery) | Admitting: Thoracic Surgery (Cardiothoracic Vascular Surgery)

## 2013-10-05 ENCOUNTER — Encounter: Payer: Self-pay | Admitting: Thoracic Surgery (Cardiothoracic Vascular Surgery)

## 2013-10-05 VITALS — BP 130/81 | HR 60 | Ht 67.0 in | Wt 179.0 lb

## 2013-10-05 DIAGNOSIS — Z09 Encounter for follow-up examination after completed treatment for conditions other than malignant neoplasm: Secondary | ICD-10-CM

## 2013-10-05 DIAGNOSIS — Z9889 Other specified postprocedural states: Secondary | ICD-10-CM

## 2013-10-05 DIAGNOSIS — C3432 Malignant neoplasm of lower lobe, left bronchus or lung: Secondary | ICD-10-CM

## 2013-10-05 DIAGNOSIS — C3430 Malignant neoplasm of lower lobe, unspecified bronchus or lung: Secondary | ICD-10-CM

## 2013-10-05 DIAGNOSIS — C3490 Malignant neoplasm of unspecified part of unspecified bronchus or lung: Secondary | ICD-10-CM

## 2013-10-05 DIAGNOSIS — Z902 Acquired absence of lung [part of]: Secondary | ICD-10-CM

## 2013-10-05 NOTE — Progress Notes (Signed)
HPI:  Mr. Brandon Newton returns for a scheduled postoperative followup visit.  He is a 73 year old gentleman who is a former smoker from [place]. He recently had a chest x-ray which showed a left lower lobe mass. A PET CT should the mass was hypermetabolic. There is no evidence of metastatic disease. He underwent a thoracoscopic left lower lobectomy on 09/10/2013. He did well postoperatively. He did have some transient atrial fibrillation for which was started on amiodarone. He converted to sinus rhythm the time of discharge. He went home on August 27.  He saw Dr. Alain Marion last week and had several complaints including depression and hearing loss. He had a cerumen impaction that was cleared and his hearing improved. His amiodarone and metoprolol doses were cut in half due to bradycardia.  Today he says that he feels well. He denies any incisional pain. He has not been taking any pain medication. He says that he felt short of breath the first few days after he was home, but that has resolved.  He is accompanied by a [place] interpreter  Past Medical History  Diagnosis Date  . Anxiety   . Hypertension   . Cancer     skin  . Depression       Current Outpatient Prescriptions  Medication Sig Dispense Refill  . amiodarone (PACERONE) 200 MG tablet Take 1 tablet (200 mg total) by mouth daily.  30 tablet  0  . aspirin EC 81 MG tablet Take 1 tablet (81 mg total) by mouth daily.  100 tablet  3  . cholecalciferol (VITAMIN D) 1000 UNITS tablet Take 1 tablet (1,000 Units total) by mouth daily.  100 tablet  3  . escitalopram (LEXAPRO) 10 MG tablet Take 5 mg by mouth daily.      . metoprolol tartrate (LOPRESSOR) 25 MG tablet Take 0.5 tablets (12.5 mg total) by mouth 2 (two) times daily.  30 tablet  1  . Polyethyl Glycol-Propyl Glycol (SYSTANE ULTRA OP) Place 1 drop into both eyes daily as needed (for dry eyes).      . triamterene-hydrochlorothiazide (MAXZIDE-25) 37.5-25 MG per tablet Take 1 tablet by  mouth daily. qam  30 tablet  11  . vitamin B-12 (CYANOCOBALAMIN) 500 MCG tablet Take 500 mcg by mouth daily.      Marland Kitchen LORazepam (ATIVAN) 0.5 MG tablet Take 1-2 tablets (0.5-1 mg total) by mouth 2 (two) times daily as needed for anxiety or sleep.  60 tablet  2   No current facility-administered medications for this visit.    Physical Exam BP 130/81  Pulse 60  Ht 5\' 7"  (1.702 m)  Wt 179 lb (81.194 kg)  BMI 28.03 kg/m2  SpO25 5% 73 year old male in no acute distress Well-developed well-nourished Alert oriented x3 no focal deficits Lungs diminished at left base, otherwise clear Incisions healing well  Diagnostic Tests: FINAL DIAGNOSIS CHEST 2 VIEW  COMPARISON: PA and lateral chest of September 15, 2013  FINDINGS:  The right lung is well-expanded and clear. On the left there is  volume loss. There is a small amount of pleural fluid at the lung  base. Subsegmental atelectasis is present inferiorly as well. There  is no mediastinal shift. The heart and pulmonary vascularity are  normal. There is no pneumothorax.  IMPRESSION:  There is volume loss on the left as well as a small pleural effusion  consistent with the recent surgery. Minimal subsegmental atelectasis  in the perihilar region has developed.  Electronically Signed  By: David Martinique  On: 10/05/2013  12:48   Impression: 73 year old gentleman who is now about 3-1/2 weeks post thoracoscopic left lower lobectomy for a stage Ib (T2 A., N0) non-small cell carcinoma. He is doing exceptionally well from a surgical standpoint. It is very rare not be having significant pain still at this point in time. His exercise tolerance is good. He is anxious to resume full activities.  He may begin driving. Appropriate precautions were discussed. He is to avoid high speeds, heavy traffic, and long trips for another month.  He has stage IB disease but with a large tumor of 4.1 cm. We did discuss his case at our multidisciplinary conference and it  was felt that he could potentially benefit from postoperative adjuvant chemotherapy. I will refer him to Dr. Julien Nordmann.  Plan: Refer to oncology, Dr. Julien Nordmann  I will see him back in month to check on his progress.

## 2013-10-11 ENCOUNTER — Telehealth: Payer: Self-pay | Admitting: *Deleted

## 2013-10-11 NOTE — Telephone Encounter (Signed)
Called left vm message to call with appt

## 2013-10-13 ENCOUNTER — Other Ambulatory Visit: Payer: Self-pay | Admitting: *Deleted

## 2013-10-13 ENCOUNTER — Telehealth: Payer: Self-pay | Admitting: *Deleted

## 2013-10-13 DIAGNOSIS — C3492 Malignant neoplasm of unspecified part of left bronchus or lung: Secondary | ICD-10-CM

## 2013-10-13 NOTE — Telephone Encounter (Signed)
Received call from patients daughter.  She was made aware of appt time and place

## 2013-10-25 ENCOUNTER — Ambulatory Visit (HOSPITAL_BASED_OUTPATIENT_CLINIC_OR_DEPARTMENT_OTHER): Payer: BC Managed Care – PPO | Admitting: Internal Medicine

## 2013-10-25 ENCOUNTER — Other Ambulatory Visit: Payer: Self-pay | Admitting: Internal Medicine

## 2013-10-25 ENCOUNTER — Encounter: Payer: Self-pay | Admitting: Internal Medicine

## 2013-10-25 ENCOUNTER — Other Ambulatory Visit (HOSPITAL_BASED_OUTPATIENT_CLINIC_OR_DEPARTMENT_OTHER): Payer: BC Managed Care – PPO

## 2013-10-25 VITALS — BP 159/90 | HR 65 | Temp 98.6°F | Resp 18 | Ht 67.0 in | Wt 182.2 lb

## 2013-10-25 DIAGNOSIS — C3432 Malignant neoplasm of lower lobe, left bronchus or lung: Secondary | ICD-10-CM

## 2013-10-25 DIAGNOSIS — C3492 Malignant neoplasm of unspecified part of left bronchus or lung: Secondary | ICD-10-CM

## 2013-10-25 LAB — COMPREHENSIVE METABOLIC PANEL (CC13)
ALBUMIN: 4.1 g/dL (ref 3.5–5.0)
ALT: 21 U/L (ref 0–55)
ANION GAP: 10 meq/L (ref 3–11)
AST: 13 U/L (ref 5–34)
Alkaline Phosphatase: 58 U/L (ref 40–150)
BILIRUBIN TOTAL: 0.81 mg/dL (ref 0.20–1.20)
BUN: 16.4 mg/dL (ref 7.0–26.0)
CALCIUM: 9.9 mg/dL (ref 8.4–10.4)
CO2: 27 meq/L (ref 22–29)
Chloride: 102 mEq/L (ref 98–109)
Creatinine: 1.4 mg/dL — ABNORMAL HIGH (ref 0.7–1.3)
GLUCOSE: 93 mg/dL (ref 70–140)
Potassium: 4.2 mEq/L (ref 3.5–5.1)
SODIUM: 139 meq/L (ref 136–145)
Total Protein: 8.2 g/dL (ref 6.4–8.3)

## 2013-10-25 LAB — CBC WITH DIFFERENTIAL/PLATELET
BASO%: 1.9 % (ref 0.0–2.0)
Basophils Absolute: 0.2 10*3/uL — ABNORMAL HIGH (ref 0.0–0.1)
EOS ABS: 0.3 10*3/uL (ref 0.0–0.5)
EOS%: 3.4 % (ref 0.0–7.0)
HEMATOCRIT: 43 % (ref 38.4–49.9)
HGB: 14.4 g/dL (ref 13.0–17.1)
LYMPH#: 2.6 10*3/uL (ref 0.9–3.3)
LYMPH%: 27.8 % (ref 14.0–49.0)
MCH: 30.7 pg (ref 27.2–33.4)
MCHC: 33.5 g/dL (ref 32.0–36.0)
MCV: 91.7 fL (ref 79.3–98.0)
MONO#: 0.8 10*3/uL (ref 0.1–0.9)
MONO%: 9.2 % (ref 0.0–14.0)
NEUT%: 57.7 % (ref 39.0–75.0)
NEUTROS ABS: 5.3 10*3/uL (ref 1.5–6.5)
PLATELETS: 238 10*3/uL (ref 140–400)
RBC: 4.69 10*6/uL (ref 4.20–5.82)
RDW: 14.3 % (ref 11.0–14.6)
WBC: 9.2 10*3/uL (ref 4.0–10.3)

## 2013-10-25 NOTE — Progress Notes (Addendum)
Brandon Newton [phone]   [phone]  CONSULT NOTE  REFERRING PHYSICIAN:Dr. Modesto Charon  REASON FOR CONSULTATION:  73 years old white male recently diagnosed with lung cancer.  HPI Brandon Newton is a 73 y.o. male was past medical history significant for hypertension, depression, atrial fibrillation, and anxiety and long history of smoking but quit 15 years ago. The patient was visiting his daughter, Brandon Newton, in [place] who has a medical degree. She noticed that he was getting short of breath with mild activity. He was referred to his primary care physician Dr. Alain Marion. Chest x-ray was performed on 07/27/2013. It showed a 3.0 x 2.0 CM nodular opacity in the left lower lobe concerning for a mass. This was followed by CT scan of the chest without contrast on 08/23/2013 and it showed a subpleural left lower lobe pulmonary nodule measuring 2.8 x 2.1 CM. There was no pathologically enlarged axillary, hilar or mediastinal lymph nodes. A PET scan was performed on 08/31/2013 and showed the dominant subpleural left lower lobe pulmonary nodule was hypermetabolic and suspicious for bronchogenic malignancy. There was no evidence of metastatic disease. The patient was referred to Dr. Roxan Hockey and on 09/10/2013, he underwent left VATS with thoracoscopic left lower lobectomy and mediastinal lymph node sampling. 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 The dissected lymph nodes were negative for malignancy.  The patient is recovering well from her surgery with no significant complaints. Dr. Roxan Hockey kindly referred him to me today for evaluation and discussion of adjuvant chemotherapy options. When seen today he continues to have mild cough but no significant chest pain, shortness of breath or hemoptysis. He lost a total of 15  pounds over the last 2 months. He denied having any significant nausea or vomiting, no fever or chills. The patient denied having any headache or visual changes.  His family history significant for her father who was diagnosed with lung cancer in his 43s and mother had throat cancer in her 84s.  The patient is married and has 2 daughters. He was accompanied by his daughter Brandon Newton as well as his interpreter. He is originally from [place]. He has a history of smoking one pack per day for around 35 years but quit 15 years ago. He drinks alcohol on daily basis and no history of drug abuse. HPI  Past Medical History  Diagnosis Date  . Anxiety   . Hypertension   . Cancer     skin  . Depression     Past Surgical History  Procedure Laterality Date  . Appendectomy    . Video assisted thoracoscopy (vats)/wedge resection Left 09/10/2013    Procedure: LEFT VIDEO ASSISTED THORACOSCOPY ,WEDGE RESECTION LEFT LOWER LOBE LUNG,LEFT LOWER LOBECTOMY, & NODE SAMPLING ;  Surgeon: Melrose Nakayama, MD;  Location: [facility];  Service: Thoracic;  Laterality: Left;    Family History  Problem Relation Age of Onset  . Lung cancer Father 60    smoked  . Stomach cancer Mother     Social History History  Substance Use Topics  . Smoking status: Former Smoker -- 0.50 packs/day for 32 years    Types: Cigarettes    Quit date: 01/21/1998  . Smokeless tobacco: Never Used  . Alcohol Use: Yes     Comment: 2-3 drinks/beer a week    No Known Allergies  Current Outpatient Prescriptions  Medication Sig Dispense Refill  . amiodarone (PACERONE)  200 MG tablet Take 1 tablet (200 mg total) by mouth daily.  30 tablet  0  . aspirin EC 81 MG tablet Take 1 tablet (81 mg total) by mouth daily.  100 tablet  3  . cholecalciferol (VITAMIN D) 1000 UNITS tablet Take 1 tablet (1,000 Units total) by mouth daily.  100 tablet  3  . escitalopram (LEXAPRO) 10 MG tablet Take 5 mg by mouth daily.      Marland Kitchen FLUoxetine (PROZAC) 10 MG  capsule Take 10 mg by mouth daily.      . metoprolol tartrate (LOPRESSOR) 25 MG tablet Take 0.5 tablets (12.5 mg total) by mouth 2 (two) times daily.  30 tablet  1  . Polyethyl Glycol-Propyl Glycol (SYSTANE ULTRA OP) Place 1 drop into both eyes daily as needed (for dry eyes).      . triamterene-hydrochlorothiazide (MAXZIDE-25) 37.5-25 MG per tablet Take 1 tablet by mouth daily. qam  30 tablet  11  . vitamin B-12 (CYANOCOBALAMIN) 500 MCG tablet Take 500 mcg by mouth daily.       No current facility-administered medications for this visit.    Review of Systems  Constitutional: negative Eyes: negative Ears, nose, mouth, throat, and face: negative Respiratory: positive for cough Cardiovascular: negative Gastrointestinal: negative Genitourinary:negative Integument/breast: negative Hematologic/lymphatic: negative Musculoskeletal:negative Neurological: negative Behavioral/Psych: negative Endocrine: negative Allergic/Immunologic: negative  Physical Exam  UXL:KGMWN, healthy, no distress, well nourished and well developed SKIN: skin color, texture, turgor are normal, no rashes or significant lesions HEAD: Normocephalic, No masses, lesions, tenderness or abnormalities EYES: normal, PERRLA EARS: External ears normal, Canals clear OROPHARYNX:no exudate, no erythema and lips, buccal mucosa, and tongue normal  NECK: supple, no adenopathy, no JVD LYMPH:  no palpable lymphadenopathy, no hepatosplenomegaly LUNGS: clear to auscultation , and palpation HEART: regular rate & rhythm and no murmurs ABDOMEN:abdomen soft, non-tender, normal bowel sounds and no masses or organomegaly BACK: Back symmetric, no curvature., No CVA tenderness EXTREMITIES:no joint deformities, effusion, or inflammation, no edema, no skin discoloration  NEURO: alert & oriented x 3 with fluent speech, no focal motor/sensory deficits  PERFORMANCE STATUS: ECOG 1  LABORATORY DATA: Lab Results  Component Value Date   WBC  9.2 10/25/2013   HGB 14.4 10/25/2013   HCT 43.0 10/25/2013   MCV 91.7 10/25/2013   PLT 238 10/25/2013      Chemistry      Component Value Date/Time   NA 139 10/25/2013 1144   NA 136* 09/14/2013 0325   K 4.2 10/25/2013 1144   K 4.0 09/14/2013 0325   CL 96 09/14/2013 0325   CO2 27 10/25/2013 1144   CO2 31 09/14/2013 0325   BUN 16.4 10/25/2013 1144   BUN 18 09/14/2013 0325   CREATININE 1.4* 10/25/2013 1144   CREATININE 1.02 09/14/2013 0325      Component Value Date/Time   CALCIUM 9.9 10/25/2013 1144   CALCIUM 9.3 09/14/2013 0325   ALKPHOS 58 10/25/2013 1144   ALKPHOS 45 09/12/2013 0245   AST 13 10/25/2013 1144   AST 11 09/12/2013 0245   ALT 21 10/25/2013 1144   ALT 8 09/12/2013 0245   BILITOT 0.81 10/25/2013 1144   BILITOT 0.7 09/12/2013 0245       RADIOGRAPHIC STUDIES: Dg Chest 2 View  10/05/2013   CLINICAL DATA:  Recent left-sided lung surgery for adenocarcinoma  EXAM: CHEST  2 VIEW  COMPARISON:  PA and lateral chest of September 15, 2013  FINDINGS: The right lung is well-expanded and clear. On the left there  is volume loss. There is a small amount of pleural fluid at the lung base. Subsegmental atelectasis is present inferiorly as well. There is no mediastinal shift. The heart and pulmonary vascularity are normal. There is no pneumothorax.  IMPRESSION: There is volume loss on the left as well as a small pleural effusion consistent with the recent surgery. Minimal subsegmental atelectasis in the perihilar region has developed.   Electronically Signed   By: David  Martinique   On: 10/05/2013 12:48    ASSESSMENT: This is a very pleasant 73 years old white male recently diagnosed with stage IB (T2a, N0, M0) non-small cell lung cancer consistent with invasive adenocarcinoma with tumor size 4.1 CM with visceral pleural invasion. He is status post left lower lobectomy with lymph node sampling in August of 2015   PLAN: I had a lengthy discussion with the patient and his family today about his current disease  stage, prognosis and treatment options. I explained to the patient that the average 5 year survival for a stage IB is around 60%. I also explained to the patient that with the addition of adjuvant chemotherapy for stage IB with tumor size of 4.0 CM or more there would be an additional survival benefit of 5-10%. I explained to the patient that with the large tumor size as well as the pleural invasion he would be a good candidate for adjuvant chemotherapy with platinum-based regimen but he was also given him the option of continuous observation and close monitoring. I would consider the patient for systemic chemotherapy with 4 cycles of cisplatin 75 mg/M2 and Alimta 500 mg/M2 every 3 weeks if he decided to proceed with the adjuvant chemotherapy. I discussed the adverse effect of this treatment with the patient including but not limited to alopecia, myelosuppression, nausea and vomiting, peripheral neuropathy, liver or renal dysfunction. The patient would like some time to think about his options before making a final decision. He also asked me to call his daughter Brandon Newton in [place] [phone] 2 discussed with her his condition and the treatment options before making a decision. If the patient decided against adjuvant chemotherapy, I would see him back for follow up visit in 6 months with repeat CT scan of the chest. I spent a long time with the patient and his daughter as well as the interpreter discussing his condition and counseling regarding the adjuvant chemotherapy. He was advised to call immediately if he has any concerning symptoms in the interval.  The patient voices understanding of current disease status and treatment options and is in agreement with the current care plan.  All questions were answered. The patient knows to call the clinic with any problems, questions or concerns. We can certainly see the patient much sooner if necessary.  Thank you so much for allowing me to  participate in the care of Brandon Newton. I will continue to follow up the patient with you and assist in his care.  I spent 55 minutes counseling the patient face to face. The total time spent in the appointment was 80 minutes.  Disclaimer: This note was dictated with voice recognition software. Similar sounding words can inadvertently be transcribed and may not be corrected upon review.   Brandon Newton K. 10/25/2013, 10:03 PM

## 2013-10-29 ENCOUNTER — Telehealth: Payer: Self-pay | Admitting: Internal Medicine

## 2013-10-29 ENCOUNTER — Telehealth: Payer: Self-pay | Admitting: *Deleted

## 2013-10-29 NOTE — Telephone Encounter (Signed)
Pt's daughter Brandon Newton called stating that pt is interested in getting chemo.  They are worried about the fact he already has hearing loss and will be getting CDDP.  Per Dr Vista Mink, there is a chance he may have more hearing loss, but we would switch him to chemo if this occurs.  Informed pt's daughter, she verbalized understanding.  Per dr Vista Mink, pt needs f/u appt with APP or MD within 1 week to discuss and schedule his chemo.

## 2013-10-29 NOTE — Telephone Encounter (Signed)
Lft msg for pt on Natalya cell # [phone] confirming ML visit on 10/15 per 10/09 POF.... Cherylann Banas

## 2013-11-04 ENCOUNTER — Ambulatory Visit (HOSPITAL_BASED_OUTPATIENT_CLINIC_OR_DEPARTMENT_OTHER): Payer: BC Managed Care – PPO | Admitting: Physician Assistant

## 2013-11-04 ENCOUNTER — Telehealth: Payer: Self-pay | Admitting: Internal Medicine

## 2013-11-04 ENCOUNTER — Encounter: Payer: Self-pay | Admitting: Physician Assistant

## 2013-11-04 VITALS — BP 153/79 | HR 62 | Temp 97.9°F | Resp 18 | Ht 67.0 in | Wt 181.3 lb

## 2013-11-04 DIAGNOSIS — C3492 Malignant neoplasm of unspecified part of left bronchus or lung: Secondary | ICD-10-CM

## 2013-11-04 DIAGNOSIS — C3432 Malignant neoplasm of lower lobe, left bronchus or lung: Secondary | ICD-10-CM

## 2013-11-04 MED ORDER — CYANOCOBALAMIN 1000 MCG/ML IJ SOLN
1000.0000 ug | Freq: Once | INTRAMUSCULAR | Status: AC
  Administered 2013-11-04: 1000 ug via INTRAMUSCULAR

## 2013-11-04 MED ORDER — PROCHLORPERAZINE MALEATE 10 MG PO TABS: 10.0000 mg | ORAL_TABLET | Freq: Four times a day (QID) | ORAL | Status: DC | PRN

## 2013-11-04 MED ORDER — DEXAMETHASONE 4 MG PO TABS: ORAL_TABLET | ORAL | Status: DC

## 2013-11-04 MED ORDER — FOLIC ACID 1 MG PO TABS
1.0000 mg | ORAL_TABLET | Freq: Every day | ORAL | Status: DC
Start: 2013-11-04 — End: 2014-03-11

## 2013-11-04 MED ORDER — CYANOCOBALAMIN 1000 MCG/ML IJ SOLN
INTRAMUSCULAR | Status: AC
  Filled 2013-11-04: qty 1

## 2013-11-04 NOTE — Patient Instructions (Addendum)
Cyanocobalamin, Vitamin B12 injection What is this medicine? CYANOCOBALAMIN (sye an oh koe BAL a min) is a man made form of vitamin B12. Vitamin B12 is used in the growth of healthy blood cells, nerve cells, and proteins in the body. It also helps with the metabolism of fats and carbohydrates. This medicine is used to treat people who can not absorb vitamin B12. This medicine may be used for other purposes; ask your health care provider or pharmacist if you have questions. COMMON BRAND NAME(S): Cyomin, LA-12, Nutri-Twelve, Primabalt What should I tell my health care provider before I take this medicine? They need to know if you have any of these conditions: -kidney disease -Leber's disease -megaloblastic anemia -an unusual or allergic reaction to cyanocobalamin, cobalt, other medicines, foods, dyes, or preservatives -pregnant or trying to get pregnant -breast-feeding How should I use this medicine? This medicine is injected into a muscle or deeply under the skin. It is usually given by a health care professional in a clinic or doctor's office. However, your doctor may teach you how to inject yourself. Follow all instructions. Talk to your pediatrician regarding the use of this medicine in children. Special care may be needed. Overdosage: If you think you have taken too much of this medicine contact a poison control center or emergency room at once. NOTE: This medicine is only for you. Do not share this medicine with others. What if I miss a dose? If you are given your dose at a clinic or doctor's office, call to reschedule your appointment. If you give your own injections and you miss a dose, take it as soon as you can. If it is almost time for your next dose, take only that dose. Do not take double or extra doses. What may interact with this medicine? -colchicine -heavy alcohol intake This list may not describe all possible interactions. Give your health care provider a list of all the  medicines, herbs, non-prescription drugs, or dietary supplements you use. Also tell them if you smoke, drink alcohol, or use illegal drugs. Some items may interact with your medicine. What should I watch for while using this medicine? Visit your doctor or health care professional regularly. You may need blood work done while you are taking this medicine. You may need to follow a special diet. Talk to your doctor. Limit your alcohol intake and avoid smoking to get the best benefit. What side effects may I notice from receiving this medicine? Side effects that you should report to your doctor or health care professional as soon as possible: -allergic reactions like skin rash, itching or hives, swelling of the face, lips, or tongue -blue tint to skin -chest tightness, pain -difficulty breathing, wheezing -dizziness -red, swollen painful area on the leg Side effects that usually do not require medical attention (report to your doctor or health care professional if they continue or are bothersome): -diarrhea -headache This list may not describe all possible side effects. Call your doctor for medical advice about side effects. You may report side effects to FDA at 1-800-FDA-1088. Where should I keep my medicine? Keep out of the reach of children. Store at room temperature between 15 and 30 degrees C (59 and 85 degrees F). Protect from light. Throw away any unused medicine after the expiration date. NOTE: This sheet is a summary. It may not cover all possible information. If you have questions about this medicine, talk to your doctor, pharmacist, or health care provider.  2015, Elsevier/Gold Standard. (2007-04-20 22:10:20)    You will start adjuvant chemotherapy with carboplatin and Alimta in one week and followup in 2 weeks for a symptom management visit

## 2013-11-04 NOTE — Telephone Encounter (Signed)
gv and printed appt sched and avs fo rpt for OCT and NOV...sed added tx.Marland KitchenMarland KitchenMarland KitchenMarland Kitchenpt did not want to start tx on 10.20 and wanted tuesdays only...ok per Diane desk nurse.

## 2013-11-04 NOTE — Progress Notes (Addendum)
No images are attached to the encounter. No scans are attached to the encounter. No scans are attached to the encounter. [facility] VISIT PROGRESS NOTE  Brandon Kehr, MD [place] [address]  DIAGNOSIS: Adenocarcinoma of lung, stage 1   Primary site: Lung (Left)   Staging method: AJCC 7th Edition   Clinical: Stage IB (T2a, N0, M0) signed by Curt Bears, MD on 10/25/2013 10:26 PM   Summary: Stage IB (T2a, N0, M0)  PRIOR THERAPY: Status post left lower lobectomy with lymph node sampling  in August of 2015  CURRENT THERAPY: None   INTERVAL HISTORY: Brandon Newton 73 y.o. 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 He is accompanied by his son-in-law as well as an interpreter. He and his daughter Brandon Newton have concerns about him proceeding with adjuvant chemotherapy with cisplatin due to the possibility of ototoxicity. He is deaf in one ear and with a significant decreased hearing in any other ear. He presents to further discuss his treatment options.   MEDICAL HISTORY: Past Medical History  Diagnosis Date  . Anxiety   . Hypertension   . Cancer     skin  . Depression     ALLERGIES:  has No Known Allergies.  MEDICATIONS:  Current Outpatient Prescriptions  Medication Sig Dispense Refill  . amiodarone (PACERONE) 200 MG tablet Take 1 tablet (200 mg total) by mouth daily.  30 tablet  0  . aspirin EC 81 MG tablet Take 1 tablet (81 mg total) by mouth daily.  100 tablet  3  . cholecalciferol (VITAMIN D) 1000 UNITS tablet Take 1 tablet (1,000 Units total) by mouth daily.  100 tablet  3  . escitalopram (LEXAPRO) 10 MG tablet Take 5 mg by mouth daily.      Marland Kitchen FLUoxetine (PROZAC) 10 MG capsule Take 10 mg by mouth daily.      . metoprolol tartrate (LOPRESSOR) 25 MG tablet Take 0.5 tablets (12.5 mg total) by mouth 2  (two) times daily.  30 tablet  1  . Polyethyl Glycol-Propyl Glycol (SYSTANE ULTRA OP) Place 1 drop into both eyes daily as needed (for dry eyes).      . triamterene-hydrochlorothiazide (MAXZIDE-25) 37.5-25 MG per tablet Take 1 tablet by mouth daily. qam  30 tablet  11  . vitamin B-12 (CYANOCOBALAMIN) 500 MCG tablet Take 500 mcg by mouth daily.      Marland Kitchen dexamethasone (DECADRON) 4 MG tablet Take 1 tablet by mouth twice a day, the day before, day of, and day after chemotherapy  30 tablet  1  . folic acid (FOLVITE) 1 MG tablet Take 1 tablet (1 mg total) by mouth daily.  30 tablet  2  . prochlorperazine (COMPAZINE) 10 MG tablet Take 1 tablet (10 mg total) by mouth every 6 (six) hours as needed for nausea or vomiting.  30 tablet  0   No current facility-administered medications for this visit.    SURGICAL HISTORY:  Past Surgical History  Procedure Laterality Date  . Appendectomy    . Video assisted thoracoscopy (vats)/wedge resection Left 09/10/2013    Procedure: LEFT VIDEO ASSISTED THORACOSCOPY ,WEDGE RESECTION LEFT LOWER LOBE LUNG,LEFT LOWER LOBECTOMY, & NODE SAMPLING ;  Surgeon: Melrose Nakayama, MD;  Location: [facility];  Service: Thoracic;  Laterality: Left;    REVIEW OF SYSTEMS:  Review of Systems  Constitutional:  Negative for fever, chills, weight loss, malaise/fatigue and diaphoresis.  HENT: Positive for hearing loss. Negative for congestion, ear discharge, ear pain, nosebleeds, sore throat and tinnitus.        Patient is deaf in one ear with decreased hearing in the other  Eyes: Negative for blurred vision, double vision, photophobia, pain, discharge and redness.  Respiratory: Negative for cough, hemoptysis, sputum production, shortness of breath, wheezing and stridor.   Cardiovascular: Negative for chest pain, palpitations, orthopnea, claudication, leg swelling and PND.  Gastrointestinal: Negative for heartburn, nausea, vomiting, abdominal pain, diarrhea, constipation, blood in stool and  melena.  Genitourinary: Negative.   Musculoskeletal: Negative.   Skin: Negative.   Neurological: Negative for dizziness, tingling, focal weakness, seizures, weakness and headaches.  Endo/Heme/Allergies: Does not bruise/bleed easily.  Psychiatric/Behavioral: Negative for depression. The patient is not nervous/anxious and does not have insomnia.      PHYSICAL EXAMINATION: Physical Exam  Constitutional: He is oriented to person, place, and time and well-developed, well-nourished, and in no distress.  HENT:  Head: Normocephalic and atraumatic.  Mouth/Throat: Oropharynx is clear and moist.  Eyes: Pupils are equal, round, and reactive to light.  Neck: Normal range of motion. Neck supple. No JVD present. No tracheal deviation present. No thyromegaly present.  Cardiovascular: Normal rate, regular rhythm, normal heart sounds and intact distal pulses.  Exam reveals no gallop and no friction rub.   No murmur heard. Pulmonary/Chest: Effort normal and breath sounds normal. No respiratory distress. He has no wheezes. He has no rales.  Abdominal: Soft. Bowel sounds are normal. He exhibits no distension and no mass. There is no tenderness.  Musculoskeletal: Normal range of motion. He exhibits no edema and no tenderness.  Lymphadenopathy:    He has no cervical adenopathy.  Neurological: He is alert and oriented to person, place, and time. He has normal reflexes. Gait normal.  Skin: Skin is warm and dry. No rash noted.    ECOG PERFORMANCE STATUS: 1 - Symptomatic but completely ambulatory  Blood pressure 153/79, pulse 62, temperature 97.9 F (36.6 C), temperature source Oral, resp. rate 18, height 5\' 7"  (1.702 m), weight 181 lb 4.8 oz (82.237 kg), SpO2 97.00%.  LABORATORY DATA: Lab Results  Component Value Date   WBC 9.2 10/25/2013   HGB 14.4 10/25/2013   HCT 43.0 10/25/2013   MCV 91.7 10/25/2013   PLT 238 10/25/2013      Chemistry      Component Value Date/Time   NA 139 10/25/2013 1144   NA  136* 09/14/2013 0325   K 4.2 10/25/2013 1144   K 4.0 09/14/2013 0325   CL 96 09/14/2013 0325   CO2 27 10/25/2013 1144   CO2 31 09/14/2013 0325   BUN 16.4 10/25/2013 1144   BUN 18 09/14/2013 0325   CREATININE 1.4* 10/25/2013 1144   CREATININE 1.02 09/14/2013 0325      Component Value Date/Time   CALCIUM 9.9 10/25/2013 1144   CALCIUM 9.3 09/14/2013 0325   ALKPHOS 58 10/25/2013 1144   ALKPHOS 45 09/12/2013 0245   AST 13 10/25/2013 1144   AST 11 09/12/2013 0245   ALT 21 10/25/2013 1144   ALT 8 09/12/2013 0245   BILITOT 0.81 10/25/2013 1144   BILITOT 0.7 09/12/2013 0245       RADIOGRAPHIC STUDIES: No results found.   ASSESSMENT/PLAN:  No problem-specific assessment & plan notes found for this encounter.  patient is a pleasant 73 year old gentleman originally from [place] and. 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 He is status post left lower lobectomy with lymph node sampling in August of 2015. The patient was discussed with him also seen by Dr. Julien Nordmann. Patient is interested in pursuing adjuvant chemotherapy however he'll be treated with carboplatin instead of cisplatin due to concerns for ototoxicity. He will also receive Alimta. Chemotherapy will be carboplatin for an AUC of 5 with Alimta 500 mg per meter squared given every 3 weeks for a total 4 cycles of adjuvant chemotherapy. He was given a B12 injection today. Prescriptions for folic acid 1 mg by mouth daily, dexamethasone 4 mg by mouth twice daily the day before, the day of and the day after chemotherapy and for Compazine 10 mg by mouth every 6 hours as needed for nausea was sent to his pharmacy of record via E. scribe. He'll followup in 2 weeks for a symptom management visit.  Awilda Metro E, PA-C 11/04/2013   All questions were answered. The patient knows to call the clinic with any problems, questions or concerns. We can certainly see the  patient much sooner if necessary.  ADDENDUM: Hematology/Oncology Attending: I had a face to face encounter with the patient. I recommended his care plan. This is a very pleasant 73 years old white male recently diagnosed with a stage IB non-small cell lung cancer, adenocarcinoma status post left lower lobectomy with lymph node dissection. I had a previous discussion with the patient and his family regarding adjuvant chemotherapy and he wanted some time to think about this option. He came today for evaluation and considering to proceed with adjuvant chemotherapy but he has a concern about treatment with cisplatin because of his hearing deficit and he does not want any further deterioration of this problem. I gave the patient the option of treatment with carboplatin and Alimta versus cisplatin and Alimta. Carboplatin may be associated with less hearing deficit than cisplatin but not completely excluded. He also understands that cisplatin is the standard treatment for adjuvant therapy and carboplatin may not be as effective as cisplatin in the setting. He would be treated with carboplatin for AUC of 5 and Alimta 500 mg/M2 every 3 weeks for 4 cycles. I reminded the patient was adverse effect of this treatment including mild alopecia, myelosuppression, nausea and vomiting, peripheral neuropathy, liver or renal dysfunction as well as hearing deficit. He would like to proceed with treatment as planned. He will receive the first cycle of this treatment next week. I would arrange for the patient to have a chemotherapy education class. He would have vitamin B12 injection today and will call his pharmacy with prescription for Compazine 10 mg by mouth every 6 hours as needed for nausea, Decadron 4 mg by mouth twice a day the day before, day of and day after the chemotherapy in addition to folic acid 1 mg by mouth daily. He would come back for followup visit in 2 weeks for evaluation and management any adverse effect  of his treatment. The patient was advised to call immediately if he has any concerning symptoms in the interval.  Disclaimer: This note was dictated with voice recognition software. Similar sounding words can inadvertently be transcribed and may be missed upon review. Eilleen Kempf., MD 11/07/2013

## 2013-11-05 ENCOUNTER — Ambulatory Visit: Payer: BC Managed Care – PPO | Admitting: Physician Assistant

## 2013-11-05 ENCOUNTER — Other Ambulatory Visit: Payer: Self-pay | Admitting: Thoracic Surgery (Cardiothoracic Vascular Surgery)

## 2013-11-05 DIAGNOSIS — C3490 Malignant neoplasm of unspecified part of unspecified bronchus or lung: Secondary | ICD-10-CM

## 2013-11-09 ENCOUNTER — Ambulatory Visit
Admission: RE | Admit: 2013-11-09 | Discharge: 2013-11-09 | Disposition: A | Discharge status: Home / Self Care | Payer: BC Managed Care – PPO | Source: Ambulatory Visit | Attending: Thoracic Surgery (Cardiothoracic Vascular Surgery) | Admitting: Thoracic Surgery (Cardiothoracic Vascular Surgery)

## 2013-11-09 ENCOUNTER — Ambulatory Visit (INDEPENDENT_AMBULATORY_CARE_PROVIDER_SITE_OTHER): Payer: Self-pay | Admitting: Thoracic Surgery (Cardiothoracic Vascular Surgery)

## 2013-11-09 ENCOUNTER — Encounter: Payer: Self-pay | Admitting: Thoracic Surgery (Cardiothoracic Vascular Surgery)

## 2013-11-09 VITALS — BP 136/83 | HR 58 | Ht 67.0 in | Wt 181.0 lb

## 2013-11-09 DIAGNOSIS — C3490 Malignant neoplasm of unspecified part of unspecified bronchus or lung: Secondary | ICD-10-CM

## 2013-11-09 DIAGNOSIS — C3432 Malignant neoplasm of lower lobe, left bronchus or lung: Secondary | ICD-10-CM

## 2013-11-09 NOTE — Progress Notes (Signed)
HPI:  Mr. Brandon Newton returns today for a scheduled postoperative followup visit. He is accompanied by his granddaughter and an interpreter.  He is a 73 year old man who had a thoracoscopic left lower lobectomy on 09/10/2013. His tumor turned out to be a stage IB (T2, N0) non-small cell carcinoma. The mass measured 4.1 cm. He had some atrial fibrillation postoperatively but converted to sinus rhythm with amiodarone. He did not require anticoagulation.  At his last visit he was making good progress. Since that time he has seen Dr. Julien Nordmann and now has decided to go forward with chemotherapy. They're going to start that in about a week.  He says he will notice some soreness in his incisions when he is working hard, other than that he is not having any pain. He's not had any difficulty with shortness of breath. His weight has been stable. Not had any unusual headaches or visual changes.  Past Medical History  Diagnosis Date  . Anxiety   . Hypertension   . Cancer     skin  . Depression       Current Outpatient Prescriptions  Medication Sig Dispense Refill  . amiodarone (PACERONE) 200 MG tablet Take 1 tablet (200 mg total) by mouth daily.  30 tablet  0  . aspirin EC 81 MG tablet Take 1 tablet (81 mg total) by mouth daily.  100 tablet  3  . cholecalciferol (VITAMIN D) 1000 UNITS tablet Take 1 tablet (1,000 Units total) by mouth daily.  100 tablet  3  . dexamethasone (DECADRON) 4 MG tablet Take 1 tablet by mouth twice a day, the day before, day of, and day after chemotherapy  30 tablet  1  . escitalopram (LEXAPRO) 10 MG tablet Take 5 mg by mouth daily.      Marland Kitchen FLUoxetine (PROZAC) 10 MG capsule Take 10 mg by mouth daily.      . folic acid (FOLVITE) 1 MG tablet Take 1 tablet (1 mg total) by mouth daily.  30 tablet  2  . metoprolol tartrate (LOPRESSOR) 25 MG tablet Take 0.5 tablets (12.5 mg total) by mouth 2 (two) times daily.  30 tablet  1  . Polyethyl Glycol-Propyl Glycol (SYSTANE ULTRA OP) Place  1 drop into both eyes daily as needed (for dry eyes).      . triamterene-hydrochlorothiazide (MAXZIDE-25) 37.5-25 MG per tablet Take 1 tablet by mouth daily. qam  30 tablet  11  . vitamin B-12 (CYANOCOBALAMIN) 500 MCG tablet Take 500 mcg by mouth daily.      . prochlorperazine (COMPAZINE) 10 MG tablet Take 1 tablet (10 mg total) by mouth every 6 (six) hours as needed for nausea or vomiting.  30 tablet  0   No current facility-administered medications for this visit.    Physical Exam BP 136/83  Pulse 58  Ht 5\' 7"  (1.702 m)  Wt 181 lb (82.101 kg)  BMI 28.34 kg/m2  SpO49 61% 73 year old man in no acute distress Alert and oriented x3 with no focal neurologic deficits Lungs diminished in left base otherwise clear Cardiac regular rate and rhythm normal S1-S2 Incisions well healed  Diagnostic Tests: CHEST 2 VIEW  COMPARISON: 10/05/2013  FINDINGS:  Examination demonstrates a postsurgical change rib volume loss of  the left lung. Mild linear atelectasis/ scarring over the left  midlung. Resolution of the previously noted small left pleural  effusion. Cardiomediastinal silhouette and remainder of the exam is  unchanged to include an old displaced left clavicle fracture.  IMPRESSION:  Stable postsurgical  change of the left lung. Minimal linear  scarring/ atelectasis left midlung. Resolved small left pleural  effusion.  Electronically Signed  By: Marin Olp M.D.  On: 11/09/2013 11:23   Impression: 73 year old man who is now about 2 months out from a thoracoscopic left lower lobectomy for a stage Ib non-small cell carcinoma. He will have adjuvant chemotherapy. He is doing very well at this point in time.  I did tell him that Dr. Julien Nordmann may request a Port-A-Cath. If so I will be happy to do that for him.  Since he will be having chemotherapy I will defer ordering scans to Dr. Julien Nordmann  Plan:  Return in 4 months for 6 month followup visit

## 2013-11-12 ENCOUNTER — Other Ambulatory Visit: Payer: BC Managed Care – PPO

## 2013-11-16 ENCOUNTER — Ambulatory Visit (HOSPITAL_BASED_OUTPATIENT_CLINIC_OR_DEPARTMENT_OTHER): Payer: BC Managed Care – PPO

## 2013-11-16 ENCOUNTER — Other Ambulatory Visit (HOSPITAL_BASED_OUTPATIENT_CLINIC_OR_DEPARTMENT_OTHER): Payer: BC Managed Care – PPO

## 2013-11-16 ENCOUNTER — Other Ambulatory Visit: Payer: Self-pay | Admitting: *Deleted

## 2013-11-16 VITALS — BP 145/80 | HR 67 | Temp 98.4°F | Resp 18

## 2013-11-16 DIAGNOSIS — C3432 Malignant neoplasm of lower lobe, left bronchus or lung: Secondary | ICD-10-CM

## 2013-11-16 DIAGNOSIS — C3492 Malignant neoplasm of unspecified part of left bronchus or lung: Secondary | ICD-10-CM

## 2013-11-16 DIAGNOSIS — Z5111 Encounter for antineoplastic chemotherapy: Secondary | ICD-10-CM

## 2013-11-16 LAB — CBC WITH DIFFERENTIAL/PLATELET
BASO%: 0.6 % (ref 0.0–2.0)
BASOS ABS: 0.1 10*3/uL (ref 0.0–0.1)
EOS%: 0 % (ref 0.0–7.0)
Eosinophils Absolute: 0 10*3/uL (ref 0.0–0.5)
HCT: 44.4 % (ref 38.4–49.9)
HEMOGLOBIN: 14.8 g/dL (ref 13.0–17.1)
LYMPH#: 2.3 10*3/uL (ref 0.9–3.3)
LYMPH%: 14.2 % (ref 14.0–49.0)
MCH: 30.7 pg (ref 27.2–33.4)
MCHC: 33.4 g/dL (ref 32.0–36.0)
MCV: 91.9 fL (ref 79.3–98.0)
MONO#: 1.3 10*3/uL — ABNORMAL HIGH (ref 0.1–0.9)
MONO%: 7.9 % (ref 0.0–14.0)
NEUT%: 77.3 % — ABNORMAL HIGH (ref 39.0–75.0)
NEUTROS ABS: 12.6 10*3/uL — AB (ref 1.5–6.5)
Platelets: 257 10*3/uL (ref 140–400)
RBC: 4.83 10*6/uL (ref 4.20–5.82)
RDW: 14.5 % (ref 11.0–14.6)
WBC: 16.3 10*3/uL — ABNORMAL HIGH (ref 4.0–10.3)

## 2013-11-16 LAB — COMPREHENSIVE METABOLIC PANEL (CC13)
ALK PHOS: 47 U/L (ref 40–150)
ALT: 18 U/L (ref 0–55)
AST: 9 U/L (ref 5–34)
Albumin: 4.1 g/dL (ref 3.5–5.0)
Anion Gap: 12 mEq/L — ABNORMAL HIGH (ref 3–11)
BUN: 20.9 mg/dL (ref 7.0–26.0)
CALCIUM: 10 mg/dL (ref 8.4–10.4)
CHLORIDE: 104 meq/L (ref 98–109)
CO2: 22 mEq/L (ref 22–29)
Creatinine: 1.3 mg/dL (ref 0.7–1.3)
GLUCOSE: 102 mg/dL (ref 70–140)
POTASSIUM: 3.9 meq/L (ref 3.5–5.1)
SODIUM: 138 meq/L (ref 136–145)
TOTAL PROTEIN: 8 g/dL (ref 6.4–8.3)
Total Bilirubin: 0.78 mg/dL (ref 0.20–1.20)

## 2013-11-16 MED ORDER — DEXAMETHASONE SODIUM PHOSPHATE 20 MG/5ML IJ SOLN
20.0000 mg | Freq: Once | INTRAMUSCULAR | Status: AC
  Administered 2013-11-16: 20 mg via INTRAVENOUS

## 2013-11-16 MED ORDER — ONDANSETRON 16 MG/50ML IVPB (CHCC)
16.0000 mg | Freq: Once | INTRAVENOUS | Status: AC
  Administered 2013-11-16: 16 mg via INTRAVENOUS

## 2013-11-16 MED ORDER — ONDANSETRON 16 MG/50ML IVPB (CHCC)
INTRAVENOUS | Status: AC
  Filled 2013-11-16: qty 16

## 2013-11-16 MED ORDER — SODIUM CHLORIDE 0.9 % IV SOLN
Freq: Once | INTRAVENOUS | Status: AC
  Administered 2013-11-16: 12:00:00 via INTRAVENOUS

## 2013-11-16 MED ORDER — SODIUM CHLORIDE 0.9 % IV SOLN
500.0000 mg/m2 | Freq: Once | INTRAVENOUS | Status: AC
  Administered 2013-11-16: 975 mg via INTRAVENOUS
  Filled 2013-11-16: qty 39

## 2013-11-16 MED ORDER — CARBOPLATIN CHEMO INJECTION 600 MG/60ML
402.5000 mg | Freq: Once | INTRAVENOUS | Status: AC
  Administered 2013-11-16: 400 mg via INTRAVENOUS
  Filled 2013-11-16: qty 40

## 2013-11-16 MED ORDER — DEXAMETHASONE SODIUM PHOSPHATE 20 MG/5ML IJ SOLN
INTRAMUSCULAR | Status: AC
  Filled 2013-11-16: qty 5

## 2013-11-16 NOTE — Patient Instructions (Signed)
[facility] Discharge Instructions for Patients Receiving Chemotherapy  Today you received the following chemotherapy agents Alimta/Carboplatin.   To help prevent nausea and vomiting after your treatment, we encourage you to take your nausea medication as directed.    If you develop nausea and vomiting that is not controlled by your nausea medication, call the clinic.   BELOW ARE SYMPTOMS THAT SHOULD BE REPORTED IMMEDIATELY:  *FEVER GREATER THAN 100.5 F  *CHILLS WITH OR WITHOUT FEVER  NAUSEA AND VOMITING THAT IS NOT CONTROLLED WITH YOUR NAUSEA MEDICATION  *UNUSUAL SHORTNESS OF BREATH  *UNUSUAL BRUISING OR BLEEDING  TENDERNESS IN MOUTH AND THROAT WITH OR WITHOUT PRESENCE OF ULCERS  *URINARY PROBLEMS  *BOWEL PROBLEMS  UNUSUAL RASH Items with * indicate a potential emergency and should be followed up as soon as possible.  Feel free to call the clinic you have any questions or concerns. The clinic phone number is [phone].

## 2013-11-17 ENCOUNTER — Telehealth: Payer: Self-pay | Admitting: *Deleted

## 2013-11-17 ENCOUNTER — Encounter: Payer: Self-pay | Admitting: General Practice

## 2013-11-17 NOTE — Telephone Encounter (Signed)
Unable to reach patient-left VM for daughter to speak with patient and call office with update on how he is doing or has any questions or concerns. Had pacific interpreters on line, but daughter's voice mail was in [place].

## 2013-11-17 NOTE — Progress Notes (Signed)
Met Brandon Newton at chemo class with interpreter [place] and [employer].  He was appreciative to learn of [facility] team and resources, and reports good support from family also.  Per pt, he has lived in the [place] for eight years, in several states because of his daughters' professional moves.  Pt/family aware of ongoing chaplain availability, but please also page as needs arise:  [phone].  Thank you.  [place], [place]

## 2013-11-23 ENCOUNTER — Other Ambulatory Visit (HOSPITAL_BASED_OUTPATIENT_CLINIC_OR_DEPARTMENT_OTHER): Payer: BC Managed Care – PPO

## 2013-11-23 ENCOUNTER — Telehealth: Payer: Self-pay | Admitting: Internal Medicine

## 2013-11-23 ENCOUNTER — Encounter: Payer: Self-pay | Admitting: Internal Medicine

## 2013-11-23 ENCOUNTER — Ambulatory Visit (HOSPITAL_BASED_OUTPATIENT_CLINIC_OR_DEPARTMENT_OTHER): Payer: BC Managed Care – PPO | Admitting: Internal Medicine

## 2013-11-23 VITALS — BP 154/94 | HR 67 | Temp 97.8°F | Resp 21 | Ht 67.0 in | Wt 178.1 lb

## 2013-11-23 DIAGNOSIS — C3492 Malignant neoplasm of unspecified part of left bronchus or lung: Secondary | ICD-10-CM

## 2013-11-23 DIAGNOSIS — C3432 Malignant neoplasm of lower lobe, left bronchus or lung: Secondary | ICD-10-CM

## 2013-11-23 LAB — CBC WITH DIFFERENTIAL/PLATELET
BASO%: 0.4 % (ref 0.0–2.0)
BASOS ABS: 0 10*3/uL (ref 0.0–0.1)
EOS ABS: 0.6 10*3/uL — AB (ref 0.0–0.5)
EOS%: 7.9 % — ABNORMAL HIGH (ref 0.0–7.0)
HEMATOCRIT: 42.4 % (ref 38.4–49.9)
HEMOGLOBIN: 14.5 g/dL (ref 13.0–17.1)
LYMPH%: 35.3 % (ref 14.0–49.0)
MCH: 31 pg (ref 27.2–33.4)
MCHC: 34.1 g/dL (ref 32.0–36.0)
MCV: 90.9 fL (ref 79.3–98.0)
MONO#: 0.6 10*3/uL (ref 0.1–0.9)
MONO%: 8.3 % (ref 0.0–14.0)
NEUT%: 48.1 % (ref 39.0–75.0)
NEUTROS ABS: 3.5 10*3/uL (ref 1.5–6.5)
Platelets: 236 10*3/uL (ref 140–400)
RBC: 4.67 10*6/uL (ref 4.20–5.82)
RDW: 14.1 % (ref 11.0–14.6)
WBC: 7.2 10*3/uL (ref 4.0–10.3)
lymph#: 2.5 10*3/uL (ref 0.9–3.3)

## 2013-11-23 LAB — COMPREHENSIVE METABOLIC PANEL (CC13)
ALK PHOS: 52 U/L (ref 40–150)
ALT: 22 U/L (ref 0–55)
AST: 14 U/L (ref 5–34)
Albumin: 3.9 g/dL (ref 3.5–5.0)
Anion Gap: 11 mEq/L (ref 3–11)
BILIRUBIN TOTAL: 0.94 mg/dL (ref 0.20–1.20)
BUN: 22.8 mg/dL (ref 7.0–26.0)
CO2: 22 mEq/L (ref 22–29)
Calcium: 9.5 mg/dL (ref 8.4–10.4)
Chloride: 104 mEq/L (ref 98–109)
Creatinine: 1.3 mg/dL (ref 0.7–1.3)
Glucose: 102 mg/dl (ref 70–140)
Potassium: 3.7 mEq/L (ref 3.5–5.1)
SODIUM: 136 meq/L (ref 136–145)
TOTAL PROTEIN: 7.8 g/dL (ref 6.4–8.3)

## 2013-11-23 NOTE — Telephone Encounter (Signed)
gv adn printed appt sched and avs for pt for NOV and Dec...sed added tx.

## 2013-11-23 NOTE — Progress Notes (Signed)
[facility] [phone]   [phone]  OFFICE PROGRESS NOTE  [name], MD [place] [address]  DIAGNOSIS: Stage IB (T2a, N0, M0) non-small cell lung cancer consistent with invasive adenocarcinoma with tumor size 4.1 CM with visceral pleural invasion diagnosed in August 2015.   PRIOR THERAPY: Status post left lower lobectomy with lymph node sampling in August of 2015 under the care of Dr. Roxan Hockey.  CURRENT THERAPY: Adjuvant systemic chemotherapy with carboplatin for AUC of 5 and Alimta 500 MG/M2 every 3 weeks status post 1 cycle.  INTERVAL HISTORY: Brandon Newton 73 y.o. male returns to the clinic today for follow-up visit accompanied by his interpreter as well as his son-in-law. The patient tolerated the first week of his chemotherapy fairly well with no significant adverse effects. He denied having any significant fever or chills. He has mild nausea resolved with Compazine. He denied having any significant chest pain, shortness of breath, cough or hemoptysis. He lost 3 pounds since his last visit but he eats very good and no lack of appetite.  MEDICAL HISTORY: Past Medical History  Diagnosis Date  . Anxiety   . Hypertension   . Cancer     skin  . Depression     ALLERGIES:  has No Known Allergies.  MEDICATIONS:  Current Outpatient Prescriptions  Medication Sig Dispense Refill  . amiodarone (PACERONE) 200 MG tablet Take 1 tablet (200 mg total) by mouth daily. 30 tablet 0  . aspirin EC 81 MG tablet Take 1 tablet (81 mg total) by mouth daily. 100 tablet 3  . cholecalciferol (VITAMIN D) 1000 UNITS tablet Take 1 tablet (1,000 Units total) by mouth daily. 100 tablet 3  . escitalopram (LEXAPRO) 10 MG tablet Take 5 mg by mouth daily.    Marland Kitchen FLUoxetine (PROZAC) 10 MG capsule Take 10 mg by mouth daily.    . folic acid (FOLVITE) 1 MG tablet Take 1 tablet (1 mg total) by mouth daily. 30 tablet 2  . metoprolol tartrate  (LOPRESSOR) 25 MG tablet Take 0.5 tablets (12.5 mg total) by mouth 2 (two) times daily. 30 tablet 1  . Polyethyl Glycol-Propyl Glycol (SYSTANE ULTRA OP) Place 1 drop into both eyes daily as needed (for dry eyes).    . prochlorperazine (COMPAZINE) 10 MG tablet Take 1 tablet (10 mg total) by mouth every 6 (six) hours as needed for nausea or vomiting. 30 tablet 0  . triamterene-hydrochlorothiazide (MAXZIDE-25) 37.5-25 MG per tablet Take 1 tablet by mouth daily. qam 30 tablet 11  . vitamin B-12 (CYANOCOBALAMIN) 500 MCG tablet Take 500 mcg by mouth daily.    Marland Kitchen dexamethasone (DECADRON) 4 MG tablet Take 1 tablet by mouth twice a day, the day before, day of, and day after chemotherapy 30 tablet 1   No current facility-administered medications for this visit.    SURGICAL HISTORY:  Past Surgical History  Procedure Laterality Date  . Appendectomy    . Video assisted thoracoscopy (vats)/wedge resection Left 09/10/2013    Procedure: LEFT VIDEO ASSISTED THORACOSCOPY ,WEDGE RESECTION LEFT LOWER LOBE LUNG,LEFT LOWER LOBECTOMY, & NODE SAMPLING ;  Surgeon: Melrose Nakayama, MD;  Location: [facility];  Service: Thoracic;  Laterality: Left;    REVIEW OF SYSTEMS:  A comprehensive review of systems was negative except for: Constitutional: positive for weight loss Gastrointestinal: positive for nausea   PHYSICAL EXAMINATION: General appearance: alert, cooperative and no distress Head: Normocephalic, without obvious abnormality, atraumatic Neck: no adenopathy, no  JVD, supple, symmetrical, trachea midline and thyroid not enlarged, symmetric, no tenderness/mass/nodules Lymph nodes: Cervical, supraclavicular, and axillary nodes normal. Resp: clear to auscultation bilaterally Back: symmetric, no curvature. ROM normal. No CVA tenderness. Cardio: regular rate and rhythm, S1, S2 normal, no murmur, click, rub or gallop GI: soft, non-tender; bowel sounds normal; no masses,  no organomegaly Extremities: extremities normal,  atraumatic, no cyanosis or edema  ECOG PERFORMANCE STATUS: 1 - Symptomatic but completely ambulatory  Blood pressure 154/94, pulse 67, temperature 97.8 F (36.6 C), temperature source Oral, resp. rate 21, height 5\' 7"  (1.702 m), weight 178 lb 1.6 oz (80.786 kg), SpO2 98 %.  LABORATORY DATA: Lab Results  Component Value Date   WBC 7.2 11/23/2013   HGB 14.5 11/23/2013   HCT 42.4 11/23/2013   MCV 90.9 11/23/2013   PLT 236 11/23/2013      Chemistry      Component Value Date/Time   NA 138 11/16/2013 1128   NA 136* 09/14/2013 0325   K 3.9 11/16/2013 1128   K 4.0 09/14/2013 0325   CL 96 09/14/2013 0325   CO2 22 11/16/2013 1128   CO2 31 09/14/2013 0325   BUN 20.9 11/16/2013 1128   BUN 18 09/14/2013 0325   CREATININE 1.3 11/16/2013 1128   CREATININE 1.02 09/14/2013 0325      Component Value Date/Time   CALCIUM 10.0 11/16/2013 1128   CALCIUM 9.3 09/14/2013 0325   ALKPHOS 47 11/16/2013 1128   ALKPHOS 45 09/12/2013 0245   AST 9 11/16/2013 1128   AST 11 09/12/2013 0245   ALT 18 11/16/2013 1128   ALT 8 09/12/2013 0245   BILITOT 0.78 11/16/2013 1128   BILITOT 0.7 09/12/2013 0245       RADIOGRAPHIC STUDIES: Dg Chest 2 View  11/09/2013   CLINICAL DATA:  Adenocarcinoma of lung states 1. Post surgery 09/10/2013.  EXAM: CHEST  2 VIEW  COMPARISON:  10/05/2013  FINDINGS: Examination demonstrates a postsurgical change rib volume loss of the left lung. Mild linear atelectasis/ scarring over the left midlung. Resolution of the previously noted small left pleural effusion. Cardiomediastinal silhouette and remainder of the exam is unchanged to include an old displaced left clavicle fracture.  IMPRESSION: Stable postsurgical change of the left lung. Minimal linear scarring/ atelectasis left midlung. Resolved small left pleural effusion.   Electronically Signed   By: Marin Olp M.D.   On: 11/09/2013 11:23    ASSESSMENT AND PLAN: this is a very pleasant 73 years old white male with history  of stage IB non-small cell lung cancer, adenocarcinoma status post left lower lobectomy with lymph node dissection and currently undergoing adjuvant chemotherapy was carboplatin and Alimta status post 1 cycle. He is tolerating his treatment fairly well with no significant adverse effects. I recommended for the patient to continue his current treatment as scheduled and he will start cycle #2 on 12/07/2013. He would come back for follow-up visit at that time before starting cycle #2. He was advised to call immediately if he has any concerning symptoms in the interval. The patient voices understanding of current disease status and treatment options and is in agreement with the current care plan.  All questions were answered. The patient knows to call the clinic with any problems, questions or concerns. We can certainly see the patient much sooner if necessary.  Disclaimer: This note was dictated with voice recognition software. Similar sounding words can inadvertently be transcribed and may not be corrected upon review.

## 2013-11-30 ENCOUNTER — Other Ambulatory Visit (HOSPITAL_BASED_OUTPATIENT_CLINIC_OR_DEPARTMENT_OTHER): Payer: BC Managed Care – PPO

## 2013-11-30 DIAGNOSIS — C3432 Malignant neoplasm of lower lobe, left bronchus or lung: Secondary | ICD-10-CM

## 2013-11-30 DIAGNOSIS — C3492 Malignant neoplasm of unspecified part of left bronchus or lung: Secondary | ICD-10-CM

## 2013-11-30 LAB — CBC WITH DIFFERENTIAL/PLATELET
BASO%: 0.6 % (ref 0.0–2.0)
Basophils Absolute: 0 10*3/uL (ref 0.0–0.1)
EOS%: 6 % (ref 0.0–7.0)
Eosinophils Absolute: 0.4 10*3/uL (ref 0.0–0.5)
HEMATOCRIT: 42.6 % (ref 38.4–49.9)
HGB: 14.3 g/dL (ref 13.0–17.1)
LYMPH%: 38.1 % (ref 14.0–49.0)
MCH: 31.1 pg (ref 27.2–33.4)
MCHC: 33.5 g/dL (ref 32.0–36.0)
MCV: 92.8 fL (ref 79.3–98.0)
MONO#: 1 10*3/uL — ABNORMAL HIGH (ref 0.1–0.9)
MONO%: 13.7 % (ref 0.0–14.0)
NEUT#: 2.9 10*3/uL (ref 1.5–6.5)
NEUT%: 41.6 % (ref 39.0–75.0)
PLATELETS: 145 10*3/uL (ref 140–400)
RBC: 4.59 10*6/uL (ref 4.20–5.82)
RDW: 14.6 % (ref 11.0–14.6)
WBC: 7 10*3/uL (ref 4.0–10.3)
lymph#: 2.7 10*3/uL (ref 0.9–3.3)

## 2013-11-30 LAB — COMPREHENSIVE METABOLIC PANEL (CC13)
ALT: 29 U/L (ref 0–55)
ANION GAP: 8 meq/L (ref 3–11)
AST: 21 U/L (ref 5–34)
Albumin: 4.1 g/dL (ref 3.5–5.0)
Alkaline Phosphatase: 55 U/L (ref 40–150)
BILIRUBIN TOTAL: 0.68 mg/dL (ref 0.20–1.20)
BUN: 18.4 mg/dL (ref 7.0–26.0)
CO2: 28 meq/L (ref 22–29)
Calcium: 10 mg/dL (ref 8.4–10.4)
Chloride: 100 mEq/L (ref 98–109)
Creatinine: 1.2 mg/dL (ref 0.7–1.3)
Glucose: 81 mg/dl (ref 70–140)
Potassium: 4.4 mEq/L (ref 3.5–5.1)
Sodium: 137 mEq/L (ref 136–145)
Total Protein: 7.8 g/dL (ref 6.4–8.3)

## 2013-12-07 ENCOUNTER — Telehealth: Payer: Self-pay | Admitting: Adult Health

## 2013-12-07 ENCOUNTER — Ambulatory Visit (HOSPITAL_BASED_OUTPATIENT_CLINIC_OR_DEPARTMENT_OTHER): Payer: BC Managed Care – PPO | Admitting: Adult Health

## 2013-12-07 ENCOUNTER — Ambulatory Visit: Payer: BC Managed Care – PPO

## 2013-12-07 ENCOUNTER — Ambulatory Visit (HOSPITAL_BASED_OUTPATIENT_CLINIC_OR_DEPARTMENT_OTHER): Payer: BC Managed Care – PPO

## 2013-12-07 ENCOUNTER — Other Ambulatory Visit: Payer: Self-pay | Admitting: Physician Assistant

## 2013-12-07 ENCOUNTER — Other Ambulatory Visit: Payer: BC Managed Care – PPO

## 2013-12-07 ENCOUNTER — Encounter: Payer: Self-pay | Admitting: Adult Health

## 2013-12-07 ENCOUNTER — Other Ambulatory Visit (HOSPITAL_BASED_OUTPATIENT_CLINIC_OR_DEPARTMENT_OTHER): Payer: BC Managed Care – PPO

## 2013-12-07 ENCOUNTER — Telehealth: Payer: Self-pay | Admitting: *Deleted

## 2013-12-07 ENCOUNTER — Telehealth: Payer: Self-pay | Admitting: Internal Medicine

## 2013-12-07 VITALS — BP 149/86 | HR 66 | Temp 98.4°F | Resp 18 | Ht 67.0 in | Wt 183.4 lb

## 2013-12-07 DIAGNOSIS — R110 Nausea: Secondary | ICD-10-CM

## 2013-12-07 DIAGNOSIS — C3432 Malignant neoplasm of lower lobe, left bronchus or lung: Secondary | ICD-10-CM

## 2013-12-07 DIAGNOSIS — Z5111 Encounter for antineoplastic chemotherapy: Secondary | ICD-10-CM

## 2013-12-07 DIAGNOSIS — H5789 Other specified disorders of eye and adnexa: Secondary | ICD-10-CM

## 2013-12-07 DIAGNOSIS — J3489 Other specified disorders of nose and nasal sinuses: Secondary | ICD-10-CM

## 2013-12-07 DIAGNOSIS — C3492 Malignant neoplasm of unspecified part of left bronchus or lung: Secondary | ICD-10-CM

## 2013-12-07 DIAGNOSIS — H578 Other specified disorders of eye and adnexa: Secondary | ICD-10-CM

## 2013-12-07 LAB — COMPREHENSIVE METABOLIC PANEL
ALT: 29 U/L (ref 0–53)
AST: 17 U/L (ref 0–37)
Albumin: 4.2 g/dL (ref 3.5–5.2)
Alkaline Phosphatase: 55 U/L (ref 39–117)
BUN: 20 mg/dL (ref 6–23)
CO2: 23 mEq/L (ref 19–32)
Calcium: 10.2 mg/dL (ref 8.4–10.5)
Chloride: 98 mEq/L (ref 96–112)
Creatinine, Ser: 1.09 mg/dL (ref 0.50–1.35)
Glucose, Bld: 111 mg/dL — ABNORMAL HIGH (ref 70–99)
Potassium: 4.7 mEq/L (ref 3.5–5.3)
Sodium: 135 mEq/L (ref 135–145)
Total Bilirubin: 0.4 mg/dL (ref 0.3–1.2)
Total Protein: 8.2 g/dL (ref 6.0–8.3)

## 2013-12-07 LAB — CBC WITH DIFFERENTIAL/PLATELET
BASO%: 0.9 % (ref 0.0–2.0)
Basophils Absolute: 0.1 10*3/uL (ref 0.0–0.1)
EOS%: 0 % (ref 0.0–7.0)
Eosinophils Absolute: 0 10*3/uL (ref 0.0–0.5)
HEMATOCRIT: 42.2 % (ref 38.4–49.9)
HEMOGLOBIN: 14.1 g/dL (ref 13.0–17.1)
LYMPH%: 16.1 % (ref 14.0–49.0)
MCH: 31.1 pg (ref 27.2–33.4)
MCHC: 33.4 g/dL (ref 32.0–36.0)
MCV: 93 fL (ref 79.3–98.0)
MONO#: 1 10*3/uL — ABNORMAL HIGH (ref 0.1–0.9)
MONO%: 8.9 % (ref 0.0–14.0)
NEUT#: 8.3 10*3/uL — ABNORMAL HIGH (ref 1.5–6.5)
NEUT%: 74.1 % (ref 39.0–75.0)
Platelets: 306 10*3/uL (ref 140–400)
RBC: 4.54 10*6/uL (ref 4.20–5.82)
RDW: 15.5 % — ABNORMAL HIGH (ref 11.0–14.6)
WBC: 11.2 10*3/uL — ABNORMAL HIGH (ref 4.0–10.3)
lymph#: 1.8 10*3/uL (ref 0.9–3.3)

## 2013-12-07 MED ORDER — ONDANSETRON 16 MG/50ML IVPB (CHCC)
INTRAVENOUS | Status: AC
  Filled 2013-12-07: qty 16

## 2013-12-07 MED ORDER — DEXAMETHASONE SODIUM PHOSPHATE 20 MG/5ML IJ SOLN
20.0000 mg | Freq: Once | INTRAMUSCULAR | Status: AC
  Administered 2013-12-07: 20 mg via INTRAVENOUS

## 2013-12-07 MED ORDER — ONDANSETRON 16 MG/50ML IVPB (CHCC)
16.0000 mg | Freq: Once | INTRAVENOUS | Status: AC
  Administered 2013-12-07: 16 mg via INTRAVENOUS

## 2013-12-07 MED ORDER — TOBRAMYCIN-DEXAMETHASONE 0.3-0.1 % OP SUSP: 2.0000 [drp] | OPHTHALMIC | Status: DC

## 2013-12-07 MED ORDER — FLUTICASONE PROPIONATE 50 MCG/ACT NA SUSP: 2.0000 | Freq: Every day | NASAL | Status: AC

## 2013-12-07 MED ORDER — SODIUM CHLORIDE 0.9 % IV SOLN
505.0000 mg/m2 | Freq: Once | INTRAVENOUS | Status: AC
  Administered 2013-12-07: 1000 mg via INTRAVENOUS
  Filled 2013-12-07: qty 40

## 2013-12-07 MED ORDER — SODIUM CHLORIDE 0.9 % IV SOLN
481.0000 mg | Freq: Once | INTRAVENOUS | Status: AC
  Administered 2013-12-07: 480 mg via INTRAVENOUS
  Filled 2013-12-07: qty 48

## 2013-12-07 MED ORDER — DEXAMETHASONE SODIUM PHOSPHATE 20 MG/5ML IJ SOLN
INTRAMUSCULAR | Status: AC
  Filled 2013-12-07: qty 5

## 2013-12-07 MED ORDER — SODIUM CHLORIDE 0.9 % IV SOLN
Freq: Once | INTRAVENOUS | Status: AC
  Administered 2013-12-07: 15:00:00 via INTRAVENOUS

## 2013-12-07 NOTE — Telephone Encounter (Signed)
Pt wanted to know if he should till  take Lexapro 10 mgs tabs does he take 1/2 tablet still? Is this the correct dosage for this patient. Pls let patient know.

## 2013-12-07 NOTE — Progress Notes (Signed)
[facility] Telephone:([phone]) [phone]   Fax:(336) [phone]  OFFICE PROGRESS NOTE  [name], MD [place] [address]  DIAGNOSIS: Stage IB (T2a, N0, M0) non-small cell lung cancer consistent with invasive adenocarcinoma with tumor size 4.1 CM with visceral pleural invasion diagnosed in August 2015.   PRIOR THERAPY: Status post left lower lobectomy with lymph node sampling in August of 2015 under the care of Dr. Roxan Hockey.  CURRENT THERAPY: Adjuvant systemic chemotherapy with carboplatin for AUC of 5 and Alimta 500 MG/M2 given on day 1 of a 21 day cycle.  Today is cycle 2 day 1.   INTERVAL HISTORY: Brandon Newton 73 y.o. male returns to the clinic today for follow-up visit accompanied by a [place] interpreter, Grace Blight as well as his [name] his son-in-law.  He tells me that he has three problems, his eyes, his nose and a rash.  First, his eyes are very irritated and gritty feeling despite using Systane eye gtts daily.  His eyes have been irritated for about 5 days.  He does have a runny nose.  That has been going on for about 5 days.  The drainage is clear.  He denies sore throat, headache, or pain with it.  He did use saline nasal spray which didn't help.  He also c/o rash that began about 5 days ago.  This rash is on his chest and back and is not itching.  He used some sort of steroid cream which has helped and he feels like the rash is getting better.  He denies any sun exposure.  He did have nausea after the first cycle and it was relieved with Compazine.  This stopped the fifth day after chemotherapy and has not since returned.  He denies fevers, chills, vomiting, constipation, diarrhea, numbness/tingling, or any further concerns.    MEDICAL HISTORY: Past Medical History  Diagnosis Date  . Anxiety   . Hypertension   . Cancer     skin  . Depression     ALLERGIES:  has No Known Allergies.  MEDICATIONS:  Current Outpatient Prescriptions    Medication Sig Dispense Refill  . amiodarone (PACERONE) 200 MG tablet Take 1 tablet (200 mg total) by mouth daily. 30 tablet 0  . aspirin EC 81 MG tablet Take 1 tablet (81 mg total) by mouth daily. 100 tablet 3  . cholecalciferol (VITAMIN D) 1000 UNITS tablet Take 1 tablet (1,000 Units total) by mouth daily. 100 tablet 3  . dexamethasone (DECADRON) 4 MG tablet Take 1 tablet by mouth twice a day, the day before, day of, and day after chemotherapy 30 tablet 1  . escitalopram (LEXAPRO) 10 MG tablet Take 5 mg by mouth daily.    Marland Kitchen FLUoxetine (PROZAC) 10 MG capsule Take 10 mg by mouth daily.    . folic acid (FOLVITE) 1 MG tablet Take 1 tablet (1 mg total) by mouth daily. 30 tablet 2  . metoprolol tartrate (LOPRESSOR) 25 MG tablet Take 0.5 tablets (12.5 mg total) by mouth 2 (two) times daily. 30 tablet 1  . Polyethyl Glycol-Propyl Glycol (SYSTANE ULTRA OP) Place 1 drop into both eyes daily as needed (for dry eyes).    . triamterene-hydrochlorothiazide (MAXZIDE-25) 37.5-25 MG per tablet Take 1 tablet by mouth daily. qam 30 tablet 11  . vitamin B-12 (CYANOCOBALAMIN) 500 MCG tablet Take 500 mcg by mouth daily.    . prochlorperazine (COMPAZINE) 10 MG tablet Take 1 tablet (10 mg total) by mouth  every 6 (six) hours as needed for nausea or vomiting. 30 tablet 0   No current facility-administered medications for this visit.    SURGICAL HISTORY:  Past Surgical History  Procedure Laterality Date  . Appendectomy    . Video assisted thoracoscopy (vats)/wedge resection Left 09/10/2013    Procedure: LEFT VIDEO ASSISTED THORACOSCOPY ,WEDGE RESECTION LEFT LOWER LOBE LUNG,LEFT LOWER LOBECTOMY, & NODE SAMPLING ;  Surgeon: Melrose Nakayama, MD;  Location: [facility];  Service: Thoracic;  Laterality: Left;    REVIEW OF SYSTEMS:  A 10 point review of systems was conducted and is otherwise negative except for what is noted above.      PHYSICAL EXAMINATION: BP 149/86 mmHg  Pulse 66  Temp(Src) 98.4 F (36.9 C)  (Oral)  Resp 18  Ht 5\' 7"  (1.702 m)  Wt 183 lb 6.4 oz (83.19 kg)  BMI 28.72 kg/m2 GENERAL: Patient is a well appearing  Older male in no acute distress HEENT:  Sclerae anicteric.  Oropharynx clear and moist. No ulcerations or evidence of oropharyngeal candidiasis. Neck is supple. Eyes are injected with clear, and mild white drainage.   NODES:  No cervical, supraclavicular, or axillary lymphadenopathy palpated.  LUNGS:  Clear to auscultation bilaterally.  No wheezes or rhonchi. HEART:  Regular rate and rhythm. No murmur appreciated. ABDOMEN:  Soft, nontender.  Positive, normoactive bowel sounds. No organomegaly palpated. MSK:  No focal spinal tenderness to palpation. Full range of motion bilaterally in the upper extremities. EXTREMITIES:  No peripheral edema.   SKIN:  Maculopapular rash scattered throughout upper back and chest  NEURO:  Nonfocal. Well oriented.  Appropriate affect. ECOG PERFORMANCE STATUS: 1 - Symptomatic but completely ambulatory  LABORATORY DATA: Lab Results  Component Value Date   WBC 11.2* 12/07/2013   HGB 14.1 12/07/2013   HCT 42.2 12/07/2013   MCV 93.0 12/07/2013   PLT 306 12/07/2013      Chemistry      Component Value Date/Time   NA 137 11/30/2013 1150   NA 136* 09/14/2013 0325   K 4.4 11/30/2013 1150   K 4.0 09/14/2013 0325   CL 96 09/14/2013 0325   CO2 28 11/30/2013 1150   CO2 31 09/14/2013 0325   BUN 18.4 11/30/2013 1150   BUN 18 09/14/2013 0325   CREATININE 1.2 11/30/2013 1150   CREATININE 1.02 09/14/2013 0325      Component Value Date/Time   CALCIUM 10.0 11/30/2013 1150   CALCIUM 9.3 09/14/2013 0325   ALKPHOS 55 11/30/2013 1150   ALKPHOS 45 09/12/2013 0245   AST 21 11/30/2013 1150   AST 11 09/12/2013 0245   ALT 29 11/30/2013 1150   ALT 8 09/12/2013 0245   BILITOT 0.68 11/30/2013 1150   BILITOT 0.7 09/12/2013 0245       RADIOGRAPHIC STUDIES: Dg Chest 2 View  11/09/2013   CLINICAL DATA:  Adenocarcinoma of lung states 1. Post  surgery 09/10/2013.  EXAM: CHEST  2 VIEW  COMPARISON:  10/05/2013  FINDINGS: Examination demonstrates a postsurgical change rib volume loss of the left lung. Mild linear atelectasis/ scarring over the left midlung. Resolution of the previously noted small left pleural effusion. Cardiomediastinal silhouette and remainder of the exam is unchanged to include an old displaced left clavicle fracture.  IMPRESSION: Stable postsurgical change of the left lung. Minimal linear scarring/ atelectasis left midlung. Resolved small left pleural effusion.   Electronically Signed   By: Marin Olp M.D.   On: 11/09/2013 11:23    ASSESSMENT AND PLAN: this  is a very pleasant 73 years old white male with :   1.  stage IB non-small cell lung cancer, adenocarcinoma status post left lower lobectomy with lymph node dissection and currently undergoing adjuvant chemotherapy was carboplatin and Alimta.  He is here for cycle 2 day 1 of therapy.  He tolerated therapy relatively well.  He will proceed with this today.    2.  Chemotherapy induced nausea: He will continue his anti-emetics as prescribed following chemotherapy, he was given a refill of Compazine today.    3.  Skin Rash:  He will continue taking Dexamethasone for three additional days following the chemotherapy.  [name] his son in law is very nervous about this.  I gave the patient reassurance, and told him to call us if the rash recurred.    4.  Nasal Drainage: I prescribed flonase for the patient to take daily.  Detailed information about this prescription was given to him in his AVS.    5. Eye irritation, conjunctivitis: I prescribed Tobradex eye gtts.  Detailed information was given to the patient in his AVS about this medication.    Shyam will return in one week for lab work, and in 3 weeks for labs, an office visit, and cycle 3 of chemotherapy.    The patient voices understanding of current disease status and treatment options and is in agreement with the  current care plan.  This was discussed in great detail through the interpreter.    All questions were answered. The patient knows to call the clinic with any problems, questions or concerns. We can certainly see the patient much sooner if necessary.  I spent 40 minutes counseling the patient face to face.  The total time spent in the appointment was 50 minutes.   Minette Headland, [place] [phone]

## 2013-12-07 NOTE — Patient Instructions (Signed)
Take the Dexamethasone following chemotherapy for three additional days due to the rash.  I prescribed Tobradex for your eyes and flonase for your nasal drainage.  Please let us know if the irritation continues.    Dexamethasone; Tobramycin eye suspension What is this medicine? DEXAMETHASONE; TOBRAMYCIN (dex a METH a sone; toe bra MYE sin) contains a steroid and an aminoglycoside antibiotic. It is used to treat bacterial eye infections. It will also decrease swelling, redness, and itching. This medicine may be used for other purposes; ask your health care provider or pharmacist if you have questions. COMMON BRAND NAME(S): Tobradex, Tobradex ST What should I tell my health care provider before I take this medicine? They need to know if you have any of these conditions: -any active infection -cataracts -diabetes -glaucoma -wear contact lenses -an unusual or allergic reaction to dexamethasone, tobramycin, corticosteroids, other medicines, foods, dyes, or preservatives -pregnant or trying to get pregnant -breast-feeding How should I use this medicine? This medicine is only for use in the eye. Follow the directions on the prescription label. Wash hands before and after use. Shake well before using. Tilt your head back slightly and pull your lower eyelid down with your index finger to form a pouch. Try not to touch the tip of the dropper or tube to your eye, fingertips, or other surface. Squeeze the prescribed number of drops into the pouch. Close the eye gently to spread the drops. Do not use your medicine more often than directed. Finish the full course of medicine prescribed by your doctor or health care professional even if your condition is better. Do not stop using except on the advice of your doctor or health care professional. Talk to your pediatrician regarding the use of this medicine in children. Special care may be needed. Overdosage: If you think you have taken too much of this medicine  contact a poison control center or emergency room at once. NOTE: This medicine is only for you. Do not share this medicine with others. What if I miss a dose? If you miss a dose, take it as soon as you can. If it is almost time for your next dose, take only that dose. Do not take double or extra doses. What may interact with this medicine? Interactions are not expected. Do not use any other eye products without telling your doctor or health care professional. This list may not describe all possible interactions. Give your health care provider a list of all the medicines, herbs, non-prescription drugs, or dietary supplements you use. Also tell them if you smoke, drink alcohol, or use illegal drugs. Some items may interact with your medicine. What should I watch for while using this medicine? Check with your doctor or health care professional if your condition does not get better within 5 days, or if it gets worse. Tell your doctor or health care professional if you are exposed to anyone with measles or chickenpox, or if you develop sores or blisters that do not heal properly. If you wear contact lenses, ask your doctor or health care professional when you can use your lenses again. A burning or stinging reaction that does not go away may mean you are allergic to this product. Stop using and call your doctor or health care professional. This medicine can make certain eye conditions worse. Only use it for conditions for which your doctor or health care professional has prescribed. To prevent the spread of infection, do not share eye products, towels and washcloths with anyone  else. Throw away any unused eye products. What side effects may I notice from receiving this medicine? Side effects that you should report to your doctor or health care professional as soon as possible: -allergic reactions like skin rash, itching or hives, swelling of the face, lips, or tongue -burning, stinging or swelling of the  eyelids -changes in vision -eye pain -infection -watery eyes Side effects that usually do not require medical attention (report to your doctor or health care professional if they continue or are bothersome): -eye irritation, itching -mild burning, redness or stinging in the eye -temporary watering or blurring of vision This list may not describe all possible side effects. Call your doctor for medical advice about side effects. You may report side effects to FDA at 1-800-FDA-1088. Where should I keep my medicine? Keep out of the reach of children. Store between 8 and 27 degrees C (46 and 80 degrees F). Store suspension upright. Throw away any unused medicine after the expiration date. NOTE: This sheet is a summary. It may not cover all possible information. If you have questions about this medicine, talk to your doctor, pharmacist, or health care provider.  2015, Elsevier/Gold Standard. (2007-03-26 11:36:26) Fluticasone nasal spray (Flonase) What is this medicine? FLUTICASONE (floo TIK a sone) is a corticosteroid. This medicine is used to treat the symptoms of allergies like sneezing, itchy red eyes, and itchy, runny, or stuffy nose. This medicine may be used for other purposes; ask your health care provider or pharmacist if you have questions. COMMON BRAND NAME(S): Flonase, Flonase Allergy Relief, Veramyst What should I tell my health care provider before I take this medicine? They need to know if you have any of these conditions: -infection, like tuberculosis, herpes, or fungal infection -recent surgery on nose or sinuses -taking corticosteroid by mouth -an unusual or allergic reaction to fluticasone, steroids, other medicines, foods, dyes, or preservatives -pregnant or trying to get pregnant -breast-feeding How should I use this medicine? This medicine is for use in the nose. Follow the directions on your product or prescription label. This medicine works best if used at regular  intervals. Do not use more often than directed. Make sure that you are using your nasal spray correctly. After 6 months of daily use without a prescription, talk to your doctor or health care professional before using it for a longer time. Ask your doctor or health care professional if you have any questions. Talk to your pediatrician regarding the use of this medicine in children. While this drug may be used for children as young as 25 years old for selected conditions, precautions do apply. After 2 months of daily use without a prescription in a child, talk to your pediatrician before using it for a longer time. Overdosage: If you think you have taken too much of this medicine contact a poison control center or emergency room at once. NOTE: This medicine is only for you. Do not share this medicine with others. What if I miss a dose? If you miss a dose, use it as soon as you remember. If it is almost time for your next dose, use only that dose and continue with your regular schedule. Do not use double or extra doses. What may interact with this medicine? -ketoconazole -metyrapone -some medicines for HIV -vaccines This list may not describe all possible interactions. Give your health care provider a list of all the medicines, herbs, non-prescription drugs, or dietary supplements you use. Also tell them if you smoke, drink alcohol, or  use illegal drugs. Some items may interact with your medicine. What should I watch for while using this medicine? Visit your doctor or health care professional for regular checks on your progress. Some symptoms may improve within 12 hours after starting use. Check with your doctor or health care professional if there is no improvement in your condition after 3 weeks of use. Do not come in contact with people who have chickenpox or the measles while you are taking this medicine. If you do, call your doctor right away. What side effects may I notice from receiving this  medicine? Side effects that you should report to your doctor or health care professional as soon as possible: -allergic reactions like skin rash, itching or hives, swelling of the face, lips, or tongue -changes in vision -flu-like symptoms -white patches or sores in the mouth or nose Side effects that usually do not require medical attention (report to your doctor or health care professional if they continue or are bothersome): -burning or irritation inside the nose or throat -cough -headache -nosebleed -unusual taste or smell This list may not describe all possible side effects. Call your doctor for medical advice about side effects. You may report side effects to FDA at 1-800-FDA-1088. Where should I keep my medicine? Keep out of the reach of children. Store at room temperature between 15 and 30 degrees C (59 and 86 degrees F). Throw away any unused medicine after the expiration date. NOTE: This sheet is a summary. It may not cover all possible information. If you have questions about this medicine, talk to your doctor, pharmacist, or health care provider.  2015, Elsevier/Gold Standard. (2013-04-29 15:55:20)

## 2013-12-07 NOTE — Telephone Encounter (Signed)
I have adjsued appt

## 2013-12-07 NOTE — Telephone Encounter (Signed)
Take 10 mg/d if tolerated well Keep ROV Thx

## 2013-12-07 NOTE — Telephone Encounter (Signed)
, °

## 2013-12-07 NOTE — Progress Notes (Signed)
Pt was unsure  whether he should still be taking the Lexapro so I called his PCP and the his nurse will give pt a call tomorrow to let him know about this matter. I related this message to the pt.

## 2013-12-07 NOTE — Patient Instructions (Signed)
[facility] Discharge Instructions for Patients Receiving Chemotherapy  Today you received the following chemotherapy agents Alimta/Carboplatin.  To help prevent nausea and vomiting after your treatment, we encourage you to take your nausea medication as directed.    If you develop nausea and vomiting that is not controlled by your nausea medication, call the clinic.   BELOW ARE SYMPTOMS THAT SHOULD BE REPORTED IMMEDIATELY:  *FEVER GREATER THAN 100.5 F  *CHILLS WITH OR WITHOUT FEVER  NAUSEA AND VOMITING THAT IS NOT CONTROLLED WITH YOUR NAUSEA MEDICATION  *UNUSUAL SHORTNESS OF BREATH  *UNUSUAL BRUISING OR BLEEDING  TENDERNESS IN MOUTH AND THROAT WITH OR WITHOUT PRESENCE OF ULCERS  *URINARY PROBLEMS  *BOWEL PROBLEMS  UNUSUAL RASH Items with * indicate a potential emergency and should be followed up as soon as possible.  Feel free to call the clinic you have any questions or concerns. The clinic phone number is [phone].

## 2013-12-08 ENCOUNTER — Telehealth: Payer: Self-pay | Admitting: Internal Medicine

## 2013-12-08 MED ORDER — ESCITALOPRAM OXALATE 10 MG PO TABS: 5.0000 mg | ORAL_TABLET | Freq: Every day | ORAL | Status: DC

## 2013-12-08 NOTE — Telephone Encounter (Signed)
Patient's daughter called stating the patient's dosage of lexapro does not need to be changed. She states he is doing well on the 5 mg. They are requesting a refill. Pt uses [facility].

## 2013-12-08 NOTE — Telephone Encounter (Signed)
Notified pt wife with md response...Brandon Newton

## 2013-12-08 NOTE — Telephone Encounter (Signed)
Sent refill to [facility].../lmb

## 2013-12-14 ENCOUNTER — Other Ambulatory Visit (HOSPITAL_BASED_OUTPATIENT_CLINIC_OR_DEPARTMENT_OTHER): Payer: BC Managed Care – PPO

## 2013-12-14 DIAGNOSIS — C3492 Malignant neoplasm of unspecified part of left bronchus or lung: Secondary | ICD-10-CM

## 2013-12-14 DIAGNOSIS — C3432 Malignant neoplasm of lower lobe, left bronchus or lung: Secondary | ICD-10-CM

## 2013-12-14 LAB — COMPREHENSIVE METABOLIC PANEL (CC13)
ALT: 23 U/L (ref 0–55)
AST: 15 U/L (ref 5–34)
Albumin: 4.1 g/dL (ref 3.5–5.0)
Alkaline Phosphatase: 62 U/L (ref 40–150)
Anion Gap: 11 mEq/L (ref 3–11)
BUN: 19.8 mg/dL (ref 7.0–26.0)
CALCIUM: 9.8 mg/dL (ref 8.4–10.4)
CHLORIDE: 96 meq/L — AB (ref 98–109)
CO2: 29 mEq/L (ref 22–29)
CREATININE: 1.3 mg/dL (ref 0.7–1.3)
Glucose: 87 mg/dl (ref 70–140)
Potassium: 3.9 mEq/L (ref 3.5–5.1)
SODIUM: 136 meq/L (ref 136–145)
Total Bilirubin: 0.98 mg/dL (ref 0.20–1.20)
Total Protein: 7.7 g/dL (ref 6.4–8.3)

## 2013-12-14 LAB — CBC WITH DIFFERENTIAL/PLATELET
BASO%: 0.4 % (ref 0.0–2.0)
Basophils Absolute: 0 10*3/uL (ref 0.0–0.1)
EOS%: 4.4 % (ref 0.0–7.0)
Eosinophils Absolute: 0.3 10*3/uL (ref 0.0–0.5)
HCT: 44.9 % (ref 38.4–49.9)
HGB: 15.4 g/dL (ref 13.0–17.1)
LYMPH%: 37 % (ref 14.0–49.0)
MCH: 31.4 pg (ref 27.2–33.4)
MCHC: 34.2 g/dL (ref 32.0–36.0)
MCV: 91.9 fL (ref 79.3–98.0)
MONO#: 0.6 10*3/uL (ref 0.1–0.9)
MONO%: 8.5 % (ref 0.0–14.0)
NEUT#: 3.3 10*3/uL (ref 1.5–6.5)
NEUT%: 49.7 % (ref 39.0–75.0)
Platelets: 254 10*3/uL (ref 140–400)
RBC: 4.89 10*6/uL (ref 4.20–5.82)
RDW: 15.4 % — AB (ref 11.0–14.6)
WBC: 6.7 10*3/uL (ref 4.0–10.3)
lymph#: 2.5 10*3/uL (ref 0.9–3.3)

## 2013-12-28 ENCOUNTER — Ambulatory Visit (HOSPITAL_BASED_OUTPATIENT_CLINIC_OR_DEPARTMENT_OTHER): Payer: BC Managed Care – PPO | Admitting: Physician Assistant

## 2013-12-28 ENCOUNTER — Telehealth: Payer: Self-pay | Admitting: Physician Assistant

## 2013-12-28 ENCOUNTER — Other Ambulatory Visit: Payer: BC Managed Care – PPO

## 2013-12-28 ENCOUNTER — Encounter: Payer: Self-pay | Admitting: Physician Assistant

## 2013-12-28 ENCOUNTER — Ambulatory Visit (HOSPITAL_BASED_OUTPATIENT_CLINIC_OR_DEPARTMENT_OTHER): Payer: BC Managed Care – PPO

## 2013-12-28 ENCOUNTER — Other Ambulatory Visit (HOSPITAL_BASED_OUTPATIENT_CLINIC_OR_DEPARTMENT_OTHER): Payer: BC Managed Care – PPO

## 2013-12-28 VITALS — BP 152/94 | HR 73 | Temp 97.8°F | Resp 18 | Ht 67.0 in | Wt 184.0 lb

## 2013-12-28 DIAGNOSIS — C3492 Malignant neoplasm of unspecified part of left bronchus or lung: Secondary | ICD-10-CM

## 2013-12-28 DIAGNOSIS — C3432 Malignant neoplasm of lower lobe, left bronchus or lung: Secondary | ICD-10-CM

## 2013-12-28 DIAGNOSIS — Z5111 Encounter for antineoplastic chemotherapy: Secondary | ICD-10-CM

## 2013-12-28 DIAGNOSIS — R110 Nausea: Secondary | ICD-10-CM

## 2013-12-28 LAB — CBC WITH DIFFERENTIAL/PLATELET
BASO%: 0.2 % (ref 0.0–2.0)
BASOS ABS: 0 10*3/uL (ref 0.0–0.1)
EOS%: 0.1 % (ref 0.0–7.0)
Eosinophils Absolute: 0 10*3/uL (ref 0.0–0.5)
HCT: 38.1 % — ABNORMAL LOW (ref 38.4–49.9)
HGB: 13.3 g/dL (ref 13.0–17.1)
LYMPH%: 21.1 % (ref 14.0–49.0)
MCH: 32.3 pg (ref 27.2–33.4)
MCHC: 34.9 g/dL (ref 32.0–36.0)
MCV: 92.5 fL (ref 79.3–98.0)
MONO#: 1.7 10*3/uL — ABNORMAL HIGH (ref 0.1–0.9)
MONO%: 21 % — ABNORMAL HIGH (ref 0.0–14.0)
NEUT#: 4.6 10*3/uL (ref 1.5–6.5)
NEUT%: 57.6 % (ref 39.0–75.0)
Platelets: 297 10*3/uL (ref 140–400)
RBC: 4.12 10*6/uL — ABNORMAL LOW (ref 4.20–5.82)
RDW: 16 % — AB (ref 11.0–14.6)
WBC: 8.1 10*3/uL (ref 4.0–10.3)
lymph#: 1.7 10*3/uL (ref 0.9–3.3)

## 2013-12-28 LAB — COMPREHENSIVE METABOLIC PANEL (CC13)
ALK PHOS: 51 U/L (ref 40–150)
ALT: 26 U/L (ref 0–55)
AST: 15 U/L (ref 5–34)
Albumin: 4 g/dL (ref 3.5–5.0)
Anion Gap: 12 mEq/L — ABNORMAL HIGH (ref 3–11)
BUN: 20.3 mg/dL (ref 7.0–26.0)
CO2: 23 mEq/L (ref 22–29)
CREATININE: 1.2 mg/dL (ref 0.7–1.3)
Calcium: 9.9 mg/dL (ref 8.4–10.4)
Chloride: 102 mEq/L (ref 98–109)
EGFR: 61 mL/min/{1.73_m2} — ABNORMAL LOW (ref >90)
Glucose: 101 mg/dl (ref 70–140)
POTASSIUM: 4.1 meq/L (ref 3.5–5.1)
Sodium: 138 mEq/L (ref 136–145)
Total Bilirubin: 0.54 mg/dL (ref 0.20–1.20)
Total Protein: 7.6 g/dL (ref 6.4–8.3)

## 2013-12-28 MED ORDER — SODIUM CHLORIDE 0.9 % IV SOLN
448.5000 mg | Freq: Once | INTRAVENOUS | Status: AC
  Administered 2013-12-28: 450 mg via INTRAVENOUS
  Filled 2013-12-28: qty 45

## 2013-12-28 MED ORDER — DEXAMETHASONE SODIUM PHOSPHATE 20 MG/5ML IJ SOLN
INTRAMUSCULAR | Status: AC
  Filled 2013-12-28: qty 5

## 2013-12-28 MED ORDER — ONDANSETRON 16 MG/50ML IVPB (CHCC)
INTRAVENOUS | Status: AC
  Filled 2013-12-28: qty 16

## 2013-12-28 MED ORDER — DEXAMETHASONE SODIUM PHOSPHATE 20 MG/5ML IJ SOLN
20.0000 mg | Freq: Once | INTRAMUSCULAR | Status: AC
  Administered 2013-12-28: 20 mg via INTRAVENOUS

## 2013-12-28 MED ORDER — SODIUM CHLORIDE 0.9 % IV SOLN
Freq: Once | INTRAVENOUS | Status: AC
  Administered 2013-12-28: 13:00:00 via INTRAVENOUS

## 2013-12-28 MED ORDER — CYANOCOBALAMIN 1000 MCG/ML IJ SOLN
1000.0000 ug | Freq: Once | INTRAMUSCULAR | Status: AC
  Administered 2013-12-28: 1000 ug via INTRAMUSCULAR

## 2013-12-28 MED ORDER — SODIUM CHLORIDE 0.9 % IV SOLN
508.0000 mg/m2 | Freq: Once | INTRAVENOUS | Status: AC
  Administered 2013-12-28: 1000 mg via INTRAVENOUS
  Filled 2013-12-28: qty 40

## 2013-12-28 MED ORDER — ONDANSETRON 16 MG/50ML IVPB (CHCC)
16.0000 mg | Freq: Once | INTRAVENOUS | Status: AC
  Administered 2013-12-28: 16 mg via INTRAVENOUS

## 2013-12-28 NOTE — Patient Instructions (Signed)
Continue labs and chemotherapy as scheduled Follow up in 3 weeks, prior to your next scheduled cycle of chemotherapy

## 2013-12-28 NOTE — Patient Instructions (Signed)
[facility] Discharge Instructions for Patients Receiving Chemotherapy  Today you received the following chemotherapy agents: Alimta and Carboplatin.  To help prevent nausea and vomiting after your treatment, we encourage you to take your nausea medication: Compazine 10 mg every 6 hours, Decadron as directed per Dr. Julien Nordmann.   If you develop nausea and vomiting that is not controlled by your nausea medication, call the clinic.   BELOW ARE SYMPTOMS THAT SHOULD BE REPORTED IMMEDIATELY:  *FEVER GREATER THAN 100.5 F  *CHILLS WITH OR WITHOUT FEVER  NAUSEA AND VOMITING THAT IS NOT CONTROLLED WITH YOUR NAUSEA MEDICATION  *UNUSUAL SHORTNESS OF BREATH  *UNUSUAL BRUISING OR BLEEDING  TENDERNESS IN MOUTH AND THROAT WITH OR WITHOUT PRESENCE OF ULCERS  *URINARY PROBLEMS  *BOWEL PROBLEMS  UNUSUAL RASH Items with * indicate a potential emergency and should be followed up as soon as possible.  Feel free to call the clinic you have any questions or concerns. The clinic phone number is [phone].

## 2013-12-28 NOTE — Telephone Encounter (Signed)
Gave avs & cal for Dec. Sent mess to move tx down to after appt.

## 2013-12-28 NOTE — Progress Notes (Addendum)
[facility] Telephone:(336) [phone]   Fax:(336) [phone]  OFFICE PROGRESS NOTE  [name], MD [place] [address]  DIAGNOSIS: Stage IB (T2a, N0, M0) non-small cell lung cancer consistent with invasive adenocarcinoma with tumor size 4.1 CM with visceral pleural invasion diagnosed in August 2015.   PRIOR THERAPY: Status post left lower lobectomy with lymph node sampling in August of 2015 under the care of Dr. Roxan Hockey.  CURRENT THERAPY: Adjuvant systemic chemotherapy with carboplatin for AUC of 5 and Alimta 500 MG/M2 given every 3 weeks. Status post 2 cycles.     INTERVAL HISTORY: Brandon Newton 73 y.o. male returns to the clinic today for follow-up visit accompanied by a [place] interpreter, Grace Blight.  He is tolerating his chemotherapy relatively well. He was having a problem with a rash but this is well managed by taking dexamethasone  For an additional 3 days after chemotherapy. He is also on Tobradex eyedrops with relief of some irritation he was having. Today he is complaining of some mild difficulty sleeping.  He did have nausea after the first cycle and it was relieved with Compazine.  This stopped the fifth day after chemotherapy and has not since returned.  He denies fevers, chills, vomiting, constipation, diarrhea, numbness/tingling, or any further concerns.    MEDICAL HISTORY: Past Medical History  Diagnosis Date  . Anxiety   . Hypertension   . Cancer     skin  . Depression     ALLERGIES:  has No Known Allergies.  MEDICATIONS:  Current Outpatient Prescriptions  Medication Sig Dispense Refill  . amiodarone (PACERONE) 200 MG tablet Take 1 tablet (200 mg total) by mouth daily. 30 tablet 0  . aspirin EC 81 MG tablet Take 1 tablet (81 mg total) by mouth daily. 100 tablet 3  . cholecalciferol (VITAMIN D) 1000 UNITS tablet Take 1 tablet (1,000 Units total) by mouth daily. 100 tablet 3  . dexamethasone (DECADRON) 4 MG tablet  Take 1 tablet by mouth twice a day, the day before, day of, and day after chemotherapy 30 tablet 1  . escitalopram (LEXAPRO) 10 MG tablet Take 0.5 tablets (5 mg total) by mouth daily. 45 tablet 3  . FLUoxetine (PROZAC) 10 MG capsule Take 10 mg by mouth daily.    . fluticasone (FLONASE) 50 MCG/ACT nasal spray Place 2 sprays into both nostrils daily. 16 g 2  . folic acid (FOLVITE) 1 MG tablet Take 1 tablet (1 mg total) by mouth daily. 30 tablet 2  . metoprolol tartrate (LOPRESSOR) 25 MG tablet Take 0.5 tablets (12.5 mg total) by mouth 2 (two) times daily. 30 tablet 1  . Polyethyl Glycol-Propyl Glycol (SYSTANE ULTRA OP) Place 1 drop into both eyes daily as needed (for dry eyes).    . prochlorperazine (COMPAZINE) 10 MG tablet TAKE 1 TABLET (10 MG TOTAL) BY MOUTH EVERY 6 (SIX) HOURS AS NEEDED FORNAUSEA OR VOMITING 30 tablet 0  . tobramycin-dexamethasone (TOBRADEX) ophthalmic solution Place 2 drops into both eyes every 4 (four) hours while awake. 5 mL 0  . triamterene-hydrochlorothiazide (MAXZIDE-25) 37.5-25 MG per tablet Take 1 tablet by mouth daily. qam 30 tablet 11  . vitamin B-12 (CYANOCOBALAMIN) 500 MCG tablet Take 500 mcg by mouth daily.     No current facility-administered medications for this visit.    SURGICAL HISTORY:  Past Surgical History  Procedure Laterality Date  . Appendectomy    . Video assisted thoracoscopy (vats)/wedge resection Left 09/10/2013  Procedure: LEFT VIDEO ASSISTED THORACOSCOPY ,WEDGE RESECTION LEFT LOWER LOBE LUNG,LEFT LOWER LOBECTOMY, & NODE SAMPLING ;  Surgeon: Melrose Nakayama, MD;  Location: [facility];  Service: Thoracic;  Laterality: Left;    REVIEW OF SYSTEMS:  A 10 point review of systems was conducted and is otherwise negative except for what is noted above.      PHYSICAL EXAMINATION: BP 152/94 mmHg  Pulse 73  Temp(Src) 97.8 F (36.6 C) (Oral)  Resp 18  Ht 5\' 7"  (1.702 m)  Wt 184 lb (83.462 kg)  BMI 28.81 kg/m2 GENERAL: Patient is a well appearing   Older male in no acute distress HEENT:  Sclerae anicteric.  Oropharynx clear and moist. No ulcerations or evidence of oropharyngeal candidiasis. Neck is supple. Eyes are minimally injected without drainage  NODES:  No cervical, supraclavicular, or axillary lymphadenopathy palpated.  LUNGS:  Clear to auscultation bilaterally.  No wheezes or rhonchi. HEART:  Regular rate and rhythm. No murmur appreciated. ABDOMEN:  Soft, nontender.  Positive, normoactive bowel sounds. No organomegaly palpated. MSK:  No focal spinal tenderness to palpation. Full range of motion bilaterally in the upper extremities. EXTREMITIES:  No peripheral edema.   SKIN:  Without skin eruptions or ecchymosis NEURO:  Nonfocal. Well oriented.  Appropriate affect. ECOG PERFORMANCE STATUS: 1 - Symptomatic but completely ambulatory  LABORATORY DATA: Lab Results  Component Value Date   WBC 8.1 12/28/2013   HGB 13.3 12/28/2013   HCT 38.1* 12/28/2013   MCV 92.5 12/28/2013   PLT 297 12/28/2013      Chemistry      Component Value Date/Time   NA 138 12/28/2013 1144   NA 135 12/07/2013 1314   K 4.1 12/28/2013 1144   K 4.7 12/07/2013 1314   CL 98 12/07/2013 1314   CO2 23 12/28/2013 1144   CO2 23 12/07/2013 1314   BUN 20.3 12/28/2013 1144   BUN 20 12/07/2013 1314   CREATININE 1.2 12/28/2013 1144   CREATININE 1.09 12/07/2013 1314      Component Value Date/Time   CALCIUM 9.9 12/28/2013 1144   CALCIUM 10.2 12/07/2013 1314   ALKPHOS 51 12/28/2013 1144   ALKPHOS 55 12/07/2013 1314   AST 15 12/28/2013 1144   AST 17 12/07/2013 1314   ALT 26 12/28/2013 1144   ALT 29 12/07/2013 1314   BILITOT 0.54 12/28/2013 1144   BILITOT 0.4 12/07/2013 1314       RADIOGRAPHIC STUDIES: No results found.  ASSESSMENT AND PLAN: this is a very pleasant 73 years old white male with :   1.  stage IB non-small cell lung cancer, adenocarcinoma status post left lower lobectomy with lymph node dissection and currently undergoing adjuvant  chemotherapy was carboplatin and Alimta.  Status post 2 cycles. Patient was discussed with and also seen by Dr. Julien Nordmann. He will proceed with cycle #3 of 4 planned cycles today as planned.     2.  Chemotherapy induced nausea: He will continue his anti-emetics as prescribed following chemotherapy    3.  Skin Rash:  He will continue taking Dexamethasone for three additional days following the chemotherapy. This resolved the rash and he's had no further recurrence. He does have enough dexamethasone to repeat this with the subsequent cycles.  4.  Nasal Drainage: Resolved with Flonase    5. Eye irritation, conjunctivitis: Improved with Tobradex eye gtts.  .    Patient will return in 3 weeks prior to cycle #4 of his adjuvant chemotherapy, in the interim he will continue with  weekly labs as scheduled.   The patient voices understanding of current disease status and treatment options and is in agreement with the current care plan.  This was discussed in great detail through the interpreter.    All questions were answered. The patient knows to call the clinic with any problems, questions or concerns. We can certainly see the patient much sooner if necessary.  Carlton Adam, PA-C 12/28/2013  ADDENDUM: Hematology/Oncology Attending: I had a face to face encounter with the patient today. I recommended his care plan. This is a very pleasant 73 years old white male with history of a stage IB non-small cell lung cancer, adenocarcinoma currently undergoing adjuvant chemotherapy with carboplatin and Alimta status post 2 cycles. The patient is tolerating his treatment fairly well was no significant adverse effects. He denied having any significant nausea or vomiting. I recommended for him to proceed with cycle #3 today as a scheduled. The patient would come back for follow-up visit in 3 weeks with the last cycle of his adjuvant treatment.  He was advised to call immediately if he has any concerning symptoms  in the interval.   Disclaimer: This note was dictated with voice recognition software. Similar sounding words can inadvertently be transcribed and may be missed upon review. Eilleen Kempf., MD 12/28/2013

## 2014-01-03 ENCOUNTER — Other Ambulatory Visit: Payer: Self-pay | Admitting: Adult Health

## 2014-01-03 DIAGNOSIS — C3492 Malignant neoplasm of unspecified part of left bronchus or lung: Secondary | ICD-10-CM

## 2014-01-18 ENCOUNTER — Encounter: Payer: Self-pay | Admitting: Internal Medicine

## 2014-01-18 ENCOUNTER — Other Ambulatory Visit (HOSPITAL_BASED_OUTPATIENT_CLINIC_OR_DEPARTMENT_OTHER): Payer: BC Managed Care – PPO

## 2014-01-18 ENCOUNTER — Ambulatory Visit (HOSPITAL_BASED_OUTPATIENT_CLINIC_OR_DEPARTMENT_OTHER): Payer: BC Managed Care – PPO

## 2014-01-18 ENCOUNTER — Ambulatory Visit (HOSPITAL_BASED_OUTPATIENT_CLINIC_OR_DEPARTMENT_OTHER): Payer: BC Managed Care – PPO | Admitting: Internal Medicine

## 2014-01-18 ENCOUNTER — Telehealth: Payer: Self-pay | Admitting: Internal Medicine

## 2014-01-18 VITALS — BP 140/77 | HR 86 | Temp 97.8°F | Resp 18 | Ht 67.0 in | Wt 181.5 lb

## 2014-01-18 DIAGNOSIS — C782 Secondary malignant neoplasm of pleura: Secondary | ICD-10-CM

## 2014-01-18 DIAGNOSIS — Z5111 Encounter for antineoplastic chemotherapy: Secondary | ICD-10-CM

## 2014-01-18 DIAGNOSIS — C3432 Malignant neoplasm of lower lobe, left bronchus or lung: Secondary | ICD-10-CM

## 2014-01-18 DIAGNOSIS — C3492 Malignant neoplasm of unspecified part of left bronchus or lung: Secondary | ICD-10-CM

## 2014-01-18 LAB — COMPREHENSIVE METABOLIC PANEL (CC13)
ALBUMIN: 4.1 g/dL (ref 3.5–5.0)
ALT: 21 U/L (ref 0–55)
AST: 13 U/L (ref 5–34)
Alkaline Phosphatase: 50 U/L (ref 40–150)
Anion Gap: 12 mEq/L — ABNORMAL HIGH (ref 3–11)
BUN: 20.9 mg/dL (ref 7.0–26.0)
CALCIUM: 9.6 mg/dL (ref 8.4–10.4)
CHLORIDE: 103 meq/L (ref 98–109)
CO2: 23 mEq/L (ref 22–29)
Creatinine: 1.2 mg/dL (ref 0.7–1.3)
EGFR: 58 mL/min/{1.73_m2} — ABNORMAL LOW (ref >90)
GLUCOSE: 151 mg/dL — AB (ref 70–140)
POTASSIUM: 3.5 meq/L (ref 3.5–5.1)
Sodium: 137 mEq/L (ref 136–145)
TOTAL PROTEIN: 7.5 g/dL (ref 6.4–8.3)
Total Bilirubin: 0.7 mg/dL (ref 0.20–1.20)

## 2014-01-18 LAB — CBC WITH DIFFERENTIAL/PLATELET
BASO%: 0.8 % (ref 0.0–2.0)
Basophils Absolute: 0.1 10*3/uL (ref 0.0–0.1)
EOS ABS: 0 10*3/uL (ref 0.0–0.5)
EOS%: 0 % (ref 0.0–7.0)
HCT: 38 % — ABNORMAL LOW (ref 38.4–49.9)
HGB: 13 g/dL (ref 13.0–17.1)
LYMPH%: 15.6 % (ref 14.0–49.0)
MCH: 32.8 pg (ref 27.2–33.4)
MCHC: 34.2 g/dL (ref 32.0–36.0)
MCV: 96 fL (ref 79.3–98.0)
MONO#: 1 10*3/uL — ABNORMAL HIGH (ref 0.1–0.9)
MONO%: 11.8 % (ref 0.0–14.0)
NEUT%: 71.8 % (ref 39.0–75.0)
NEUTROS ABS: 6.3 10*3/uL (ref 1.5–6.5)
Platelets: 246 10*3/uL (ref 140–400)
RBC: 3.96 10*6/uL — AB (ref 4.20–5.82)
RDW: 18.5 % — ABNORMAL HIGH (ref 11.0–14.6)
WBC: 8.8 10*3/uL (ref 4.0–10.3)
lymph#: 1.4 10*3/uL (ref 0.9–3.3)

## 2014-01-18 MED ORDER — DEXAMETHASONE SODIUM PHOSPHATE 20 MG/5ML IJ SOLN
20.0000 mg | Freq: Once | INTRAMUSCULAR | Status: AC
  Administered 2014-01-18: 20 mg via INTRAVENOUS

## 2014-01-18 MED ORDER — ONDANSETRON 16 MG/50ML IVPB (CHCC)
16.0000 mg | Freq: Once | INTRAVENOUS | Status: AC
  Administered 2014-01-18: 16 mg via INTRAVENOUS

## 2014-01-18 MED ORDER — CARBOPLATIN CHEMO INJECTION 600 MG/60ML
448.5000 mg | Freq: Once | INTRAVENOUS | Status: AC
  Administered 2014-01-18: 450 mg via INTRAVENOUS
  Filled 2014-01-18: qty 45

## 2014-01-18 MED ORDER — ONDANSETRON 16 MG/50ML IVPB (CHCC)
INTRAVENOUS | Status: AC
  Filled 2014-01-18: qty 16

## 2014-01-18 MED ORDER — DEXAMETHASONE SODIUM PHOSPHATE 20 MG/5ML IJ SOLN
INTRAMUSCULAR | Status: AC
  Filled 2014-01-18: qty 5

## 2014-01-18 MED ORDER — SODIUM CHLORIDE 0.9 % IV SOLN
Freq: Once | INTRAVENOUS | Status: AC
  Administered 2014-01-18: 12:00:00 via INTRAVENOUS

## 2014-01-18 MED ORDER — PEMETREXED DISODIUM CHEMO INJECTION 500 MG
500.0000 mg/m2 | Freq: Once | INTRAVENOUS | Status: AC
  Administered 2014-01-18: 975 mg via INTRAVENOUS
  Filled 2014-01-18: qty 39

## 2014-01-18 NOTE — Telephone Encounter (Signed)
Gave avs & cal for Feb. °

## 2014-01-18 NOTE — Progress Notes (Signed)
[facility] Telephone:(336) [phone]   Fax:(336) [phone]  OFFICE PROGRESS NOTE  Brandon Kehr, MD [place] [address]  DIAGNOSIS: Stage IB (T2a, N0, M0) non-small cell lung cancer consistent with invasive adenocarcinoma with tumor size 4.1 CM with visceral pleural invasion diagnosed in August 2015.   PRIOR THERAPY: Status post left lower lobectomy with lymph node sampling in August of 2015 under the care of Dr. Roxan Hockey.  CURRENT THERAPY: Adjuvant systemic chemotherapy with carboplatin for AUC of 5 and Alimta 500 MG/M2 every 3 weeks status post 3 cycles.  INTERVAL HISTORY: Brandon Newton 73 y.o. male returns to the clinic today for follow-up visit accompanied by his interpreter as well as his son-in-law. The patient is tolerating his chemotherapy fairly well with no significant adverse effects. He denied having any significant fever or chills. He has mild nausea a few days after chemotherapy resolved with Compazine. He denied having any significant chest pain, shortness of breath, cough or hemoptysis. He denied having any significant weight loss or night sweats. He is here today to start cycle #4.   MEDICAL HISTORY: Past Medical History  Diagnosis Date  . Anxiety   . Hypertension   . Cancer     skin  . Depression     ALLERGIES:  has No Known Allergies.  MEDICATIONS:  Current Outpatient Prescriptions  Medication Sig Dispense Refill  . amiodarone (PACERONE) 200 MG tablet Take 1 tablet (200 mg total) by mouth daily. 30 tablet 0  . aspirin EC 81 MG tablet Take 1 tablet (81 mg total) by mouth daily. 100 tablet 3  . cholecalciferol (VITAMIN D) 1000 UNITS tablet Take 1 tablet (1,000 Units total) by mouth daily. 100 tablet 3  . dexamethasone (DECADRON) 4 MG tablet Take 1 tablet by mouth twice a day, the day before, day of, and day after chemotherapy 30 tablet 1  . escitalopram (LEXAPRO) 10 MG tablet Take 0.5 tablets (5 mg total) by mouth daily.  45 tablet 3  . FLUoxetine (PROZAC) 10 MG capsule Take 10 mg by mouth daily.    . fluticasone (FLONASE) 50 MCG/ACT nasal spray Place 2 sprays into both nostrils daily. 16 g 2  . folic acid (FOLVITE) 1 MG tablet Take 1 tablet (1 mg total) by mouth daily. 30 tablet 2  . metoprolol tartrate (LOPRESSOR) 25 MG tablet Take 0.5 tablets (12.5 mg total) by mouth 2 (two) times daily. 30 tablet 1  . Polyethyl Glycol-Propyl Glycol (SYSTANE ULTRA OP) Place 1 drop into both eyes daily as needed (for dry eyes).    . prochlorperazine (COMPAZINE) 10 MG tablet TAKE 1 TABLET BY MOUTH EVERY 6 HOURS AS NEEDED FOR NAUSEA AND VOMITING 30 tablet 0  . tobramycin-dexamethasone (TOBRADEX) ophthalmic solution Place 2 drops into both eyes every 4 (four) hours while awake. 5 mL 0  . triamterene-hydrochlorothiazide (MAXZIDE-25) 37.5-25 MG per tablet Take 1 tablet by mouth daily. qam 30 tablet 11  . vitamin B-12 (CYANOCOBALAMIN) 500 MCG tablet Take 500 mcg by mouth daily.     No current facility-administered medications for this visit.    SURGICAL HISTORY:  Past Surgical History  Procedure Laterality Date  . Appendectomy    . Video assisted thoracoscopy (vats)/wedge resection Left 09/10/2013    Procedure: LEFT VIDEO ASSISTED THORACOSCOPY ,WEDGE RESECTION LEFT LOWER LOBE LUNG,LEFT LOWER LOBECTOMY, & NODE SAMPLING ;  Surgeon: Melrose Nakayama, MD;  Location: [facility];  Service: Thoracic;  Laterality: Left;    REVIEW  OF SYSTEMS:  A comprehensive review of systems was negative except for: Gastrointestinal: positive for nausea   PHYSICAL EXAMINATION: General appearance: alert, cooperative and no distress Head: Normocephalic, without obvious abnormality, atraumatic Neck: no adenopathy, no JVD, supple, symmetrical, trachea midline and thyroid not enlarged, symmetric, no tenderness/mass/nodules Lymph nodes: Cervical, supraclavicular, and axillary nodes normal. Resp: clear to auscultation bilaterally Back: symmetric, no  curvature. ROM normal. No CVA tenderness. Cardio: regular rate and rhythm, S1, S2 normal, no murmur, click, rub or gallop GI: soft, non-tender; bowel sounds normal; no masses,  no organomegaly Extremities: extremities normal, atraumatic, no cyanosis or edema  ECOG PERFORMANCE STATUS: 1 - Symptomatic but completely ambulatory  Blood pressure 140/77, pulse 86, temperature 97.8 F (36.6 C), temperature source Oral, resp. rate 18, height 5\' 7"  (1.702 m), weight 181 lb 8 oz (82.328 kg), SpO2 97 %.  LABORATORY DATA: Lab Results  Component Value Date   WBC 8.8 01/18/2014   HGB 13.0 01/18/2014   HCT 38.0* 01/18/2014   MCV 96.0 01/18/2014   PLT 246 01/18/2014      Chemistry      Component Value Date/Time   NA 137 01/18/2014 1005   NA 135 12/07/2013 1314   K 3.5 01/18/2014 1005   K 4.7 12/07/2013 1314   CL 98 12/07/2013 1314   CO2 23 01/18/2014 1005   CO2 23 12/07/2013 1314   BUN 20.9 01/18/2014 1005   BUN 20 12/07/2013 1314   CREATININE 1.2 01/18/2014 1005   CREATININE 1.09 12/07/2013 1314      Component Value Date/Time   CALCIUM 9.6 01/18/2014 1005   CALCIUM 10.2 12/07/2013 1314   ALKPHOS 50 01/18/2014 1005   ALKPHOS 55 12/07/2013 1314   AST 13 01/18/2014 1005   AST 17 12/07/2013 1314   ALT 21 01/18/2014 1005   ALT 29 12/07/2013 1314   BILITOT 0.70 01/18/2014 1005   BILITOT 0.4 12/07/2013 1314       RADIOGRAPHIC STUDIES: No results found.  ASSESSMENT AND PLAN: this is a very pleasant 73 years old white male with history of stage IB non-small cell lung cancer, adenocarcinoma status post left lower lobectomy with lymph node dissection and currently undergoing adjuvant chemotherapy was carboplatin and Alimta status post 3 cycles. He is tolerating his treatment fairly well with no significant adverse effects. I recommended for the patient to continue his current treatment as scheduled and he will start cycle #4 today He would come back for follow-up visit in early February  2015 after repeating CT scan of the chest for restaging of his disease. He was advised to call immediately if he has any concerning symptoms in the interval. The patient voices understanding of current disease status and treatment options and is in agreement with the current care plan.  All questions were answered. The patient knows to call the clinic with any problems, questions or concerns. We can certainly see the patient much sooner if necessary.  Disclaimer: This note was dictated with voice recognition software. Similar sounding words can inadvertently be transcribed and may not be corrected upon review.

## 2014-01-18 NOTE — Patient Instructions (Signed)
[facility] Discharge Instructions for Patients Receiving Chemotherapy  Today you received the following chemotherapy agents Alimta and Carboplatin  To help prevent nausea and vomiting after your treatment, we encourage you to take your nausea medication as prescribed   If you develop nausea and vomiting that is not controlled by your nausea medication, call the clinic.   BELOW ARE SYMPTOMS THAT SHOULD BE REPORTED IMMEDIATELY:  *FEVER GREATER THAN 100.5 F  *CHILLS WITH OR WITHOUT FEVER  NAUSEA AND VOMITING THAT IS NOT CONTROLLED WITH YOUR NAUSEA MEDICATION  *UNUSUAL SHORTNESS OF BREATH  *UNUSUAL BRUISING OR BLEEDING  TENDERNESS IN MOUTH AND THROAT WITH OR WITHOUT PRESENCE OF ULCERS  *URINARY PROBLEMS  *BOWEL PROBLEMS  UNUSUAL RASH Items with * indicate a potential emergency and should be followed up as soon as possible.  Feel free to call the clinic you have any questions or concerns. The clinic phone number is [phone].

## 2014-02-09 ENCOUNTER — Telehealth: Payer: Self-pay | Admitting: Internal Medicine

## 2014-02-09 NOTE — Telephone Encounter (Signed)
returned call...lvm for pt to call back to r/s appt for when he will be back in town

## 2014-02-10 ENCOUNTER — Telehealth: Payer: Self-pay | Admitting: Internal Medicine

## 2014-02-10 NOTE — Telephone Encounter (Signed)
Pt's son called to r/s due to pt is out of town with daughter/grandson and return to the states the 3rd week. PT's son confirmed labs/ov

## 2014-02-22 ENCOUNTER — Ambulatory Visit (HOSPITAL_COMMUNITY): Payer: BC Managed Care – PPO

## 2014-02-22 ENCOUNTER — Other Ambulatory Visit: Payer: BC Managed Care – PPO

## 2014-02-24 ENCOUNTER — Ambulatory Visit: Payer: BC Managed Care – PPO | Admitting: Internal Medicine

## 2014-03-09 ENCOUNTER — Telehealth: Payer: Self-pay | Admitting: Internal Medicine

## 2014-03-09 MED ORDER — METOPROLOL TARTRATE 25 MG PO TABS: 12.5000 mg | ORAL_TABLET | Freq: Two times a day (BID) | ORAL | Status: DC

## 2014-03-09 NOTE — Telephone Encounter (Signed)
Pt schedule with Dr. Camila Li on 03/11/14 and he was wondering if we can send in metoprolol tartrate (LOPRESSOR) 25 MG tablet to [facility] until he comes in for the appt (pt is out of this med). Please let him know

## 2014-03-09 NOTE — Telephone Encounter (Signed)
Notified pt sent 30 day until appt...Brandon Newton

## 2014-03-11 ENCOUNTER — Other Ambulatory Visit (INDEPENDENT_AMBULATORY_CARE_PROVIDER_SITE_OTHER): Payer: BLUE CROSS/BLUE SHIELD

## 2014-03-11 ENCOUNTER — Ambulatory Visit (INDEPENDENT_AMBULATORY_CARE_PROVIDER_SITE_OTHER): Payer: BLUE CROSS/BLUE SHIELD | Admitting: Internal Medicine

## 2014-03-11 ENCOUNTER — Encounter: Payer: Self-pay | Admitting: Internal Medicine

## 2014-03-11 VITALS — BP 150/90 | HR 72 | Temp 98.6°F | Wt 184.0 lb

## 2014-03-11 DIAGNOSIS — E038 Other specified hypothyroidism: Secondary | ICD-10-CM

## 2014-03-11 DIAGNOSIS — I480 Paroxysmal atrial fibrillation: Secondary | ICD-10-CM

## 2014-03-11 DIAGNOSIS — Z902 Acquired absence of lung [part of]: Secondary | ICD-10-CM

## 2014-03-11 DIAGNOSIS — C3492 Malignant neoplasm of unspecified part of left bronchus or lung: Secondary | ICD-10-CM

## 2014-03-11 DIAGNOSIS — T462X1A Poisoning by other antidysrhythmic drugs, accidental (unintentional), initial encounter: Secondary | ICD-10-CM

## 2014-03-11 DIAGNOSIS — F329 Major depressive disorder, single episode, unspecified: Secondary | ICD-10-CM

## 2014-03-11 DIAGNOSIS — Z23 Encounter for immunization: Secondary | ICD-10-CM

## 2014-03-11 DIAGNOSIS — Z9889 Other specified postprocedural states: Secondary | ICD-10-CM

## 2014-03-11 DIAGNOSIS — E032 Hypothyroidism due to medicaments and other exogenous substances: Secondary | ICD-10-CM

## 2014-03-11 DIAGNOSIS — F32A Depression, unspecified: Secondary | ICD-10-CM

## 2014-03-11 LAB — CBC WITH DIFFERENTIAL/PLATELET
BASOS ABS: 0.1 10*3/uL (ref 0.0–0.1)
Basophils Relative: 1.1 % (ref 0.0–3.0)
EOS ABS: 0.6 10*3/uL (ref 0.0–0.7)
Eosinophils Relative: 6.7 % — ABNORMAL HIGH (ref 0.0–5.0)
HCT: 37.5 % — ABNORMAL LOW (ref 39.0–52.0)
Hemoglobin: 12.9 g/dL — ABNORMAL LOW (ref 13.0–17.0)
Lymphocytes Relative: 20.1 % (ref 12.0–46.0)
Lymphs Abs: 1.7 10*3/uL (ref 0.7–4.0)
MCHC: 34.2 g/dL (ref 30.0–36.0)
MCV: 98.5 fl (ref 78.0–100.0)
Monocytes Absolute: 0.8 10*3/uL (ref 0.1–1.0)
Monocytes Relative: 9.3 % (ref 3.0–12.0)
NEUTROS ABS: 5.4 10*3/uL (ref 1.4–7.7)
Neutrophils Relative %: 62.8 % (ref 43.0–77.0)
Platelets: 254 10*3/uL (ref 150.0–400.0)
RBC: 3.81 Mil/uL — ABNORMAL LOW (ref 4.22–5.81)
RDW: 15.1 % (ref 11.5–15.5)
WBC: 8.6 10*3/uL (ref 4.0–10.5)

## 2014-03-11 LAB — BASIC METABOLIC PANEL
BUN: 19 mg/dL (ref 6–23)
CALCIUM: 9.7 mg/dL (ref 8.4–10.5)
CO2: 27 mEq/L (ref 19–32)
Chloride: 103 mEq/L (ref 96–112)
Creatinine, Ser: 1.31 mg/dL (ref 0.40–1.50)
GFR: 57.1 mL/min — AB (ref >60.00)
Glucose, Bld: 82 mg/dL (ref 70–99)
Potassium: 4 mEq/L (ref 3.5–5.1)
SODIUM: 138 meq/L (ref 135–145)

## 2014-03-11 LAB — HEPATIC FUNCTION PANEL
ALBUMIN: 4.3 g/dL (ref 3.5–5.2)
ALT: 15 U/L (ref 0–53)
AST: 17 U/L (ref 0–37)
Alkaline Phosphatase: 58 U/L (ref 39–117)
Bilirubin, Direct: 0.1 mg/dL (ref 0.0–0.3)
Total Bilirubin: 0.6 mg/dL (ref 0.2–1.2)
Total Protein: 7.9 g/dL (ref 6.0–8.3)

## 2014-03-11 LAB — TSH: TSH: 8.31 u[IU]/mL — AB (ref 0.35–4.50)

## 2014-03-11 MED ORDER — ESCITALOPRAM OXALATE 10 MG PO TABS: 10.0000 mg | ORAL_TABLET | Freq: Every day | ORAL | Status: DC

## 2014-03-11 MED ORDER — LEVOTHYROXINE SODIUM 25 MCG PO TABS: 25.0000 ug | ORAL_TABLET | Freq: Every day | ORAL | Status: AC

## 2014-03-11 MED ORDER — METOPROLOL TARTRATE 25 MG PO TABS: 25.0000 mg | ORAL_TABLET | Freq: Two times a day (BID) | ORAL | Status: DC

## 2014-03-11 NOTE — Progress Notes (Signed)
Pre visit review using our clinic review tool, if applicable. No additional management support is needed unless otherwise documented below in the visit note. 

## 2014-03-11 NOTE — Assessment & Plan Note (Signed)
Doing well 

## 2014-03-11 NOTE — Assessment & Plan Note (Signed)
stage IB non-small cell lung cancer, adenocarcinoma status post left lower lobectomy with lymph node dissection and currently undergoing adjuvant chemotherapy was carboplatin and Alimta status post 3 cycles.

## 2014-03-11 NOTE — Assessment & Plan Note (Signed)
He is in NSR now Continue with current prescription therapy as reflected on the Med list.

## 2014-03-11 NOTE — Progress Notes (Signed)
   Subjective:    HPI  F/u HTN, post-op A fib, lung Ca  F/u mood issues, depression - he is less irritable   Wt Readings from Last 3 Encounters:  03/11/14 184 lb (83.462 kg)  01/18/14 181 lb 8 oz (82.328 kg)  12/28/13 184 lb (83.462 kg)   BP Readings from Last 3 Encounters:  03/11/14 150/90  01/18/14 140/77  12/28/13 152/94      Review of Systems  Constitutional: Positive for fatigue. Negative for chills, appetite change and unexpected weight change.  HENT: Positive for hearing loss. Negative for congestion, nosebleeds, sneezing, sore throat and trouble swallowing.   Eyes: Negative for itching and visual disturbance.  Respiratory: Negative for cough, shortness of breath and wheezing.   Cardiovascular: Negative for chest pain, palpitations and leg swelling.  Gastrointestinal: Negative for nausea, vomiting, diarrhea, blood in stool and abdominal distention.  Genitourinary: Negative for frequency and hematuria.  Musculoskeletal: Negative for back pain, joint swelling, gait problem and neck pain.  Skin: Negative for rash.  Allergic/Immunologic: Negative for food allergies.  Neurological: Negative for dizziness, tremors, speech difficulty and weakness.  Psychiatric/Behavioral: Positive for sleep disturbance. Negative for suicidal ideas, dysphoric mood and agitation. The patient is nervous/anxious.        Objective:   Physical Exam  Constitutional: He is oriented to person, place, and time. He appears well-developed. No distress.  NAD  HENT:  Mouth/Throat: Oropharynx is clear and moist.  Eyes: Conjunctivae are normal. Pupils are equal, round, and reactive to light.  Neck: Normal range of motion. No JVD present. No thyromegaly present.  Cardiovascular: Normal rate, regular rhythm, normal heart sounds and intact distal pulses.  Exam reveals no gallop and no friction rub.   No murmur heard. Pulmonary/Chest: Effort normal and breath sounds normal. No respiratory distress. He  has no wheezes. He has no rales. He exhibits no tenderness.  Abdominal: Soft. Bowel sounds are normal. He exhibits no distension and no mass. There is no tenderness. There is no rebound and no guarding.  Musculoskeletal: Normal range of motion. He exhibits no edema or tenderness.  Lymphadenopathy:    He has no cervical adenopathy.  Neurological: He is alert and oriented to person, place, and time. He has normal reflexes. No cranial nerve deficit. He exhibits normal muscle tone. He displays a negative Romberg sign. Coordination and gait normal.  No meningeal signs  Skin: Skin is warm and dry. No rash noted.  Psychiatric: He has a normal mood and affect. His behavior is normal. Judgment and thought content normal.   L chest scar healed    Lab Results  Component Value Date   WBC 8.8 01/18/2014   HGB 13.0 01/18/2014   HCT 38.0* 01/18/2014   PLT 246 01/18/2014   GLUCOSE 151* 01/18/2014   ALT 21 01/18/2014   AST 13 01/18/2014   NA 137 01/18/2014   K 3.5 01/18/2014   CL 98 12/07/2013   CREATININE 1.2 01/18/2014   BUN 20.9 01/18/2014   CO2 23 01/18/2014   TSH 3.12 07/15/2013   PSA 5.69* 07/15/2013   INR 1.03 09/08/2013        Assessment & Plan:

## 2014-03-11 NOTE — Assessment & Plan Note (Signed)
Start Rx 

## 2014-03-11 NOTE — Assessment & Plan Note (Signed)
D/c Fluoxetine Lexapro 10 mg/d

## 2014-03-14 ENCOUNTER — Telehealth: Payer: Self-pay | Admitting: *Deleted

## 2014-03-15 ENCOUNTER — Ambulatory Visit: Payer: BC Managed Care – PPO | Admitting: Thoracic Surgery (Cardiothoracic Vascular Surgery)

## 2014-03-15 ENCOUNTER — Other Ambulatory Visit (HOSPITAL_BASED_OUTPATIENT_CLINIC_OR_DEPARTMENT_OTHER): Payer: BLUE CROSS/BLUE SHIELD

## 2014-03-15 ENCOUNTER — Ambulatory Visit (HOSPITAL_COMMUNITY)
Admission: RE | Admit: 2014-03-15 | Discharge: 2014-03-15 | Disposition: A | Discharge status: Home / Self Care | Payer: BLUE CROSS/BLUE SHIELD | Source: Ambulatory Visit | Attending: Internal Medicine | Admitting: Internal Medicine

## 2014-03-15 ENCOUNTER — Encounter (HOSPITAL_COMMUNITY): Payer: Self-pay

## 2014-03-15 DIAGNOSIS — C3492 Malignant neoplasm of unspecified part of left bronchus or lung: Secondary | ICD-10-CM

## 2014-03-15 DIAGNOSIS — Z902 Acquired absence of lung [part of]: Secondary | ICD-10-CM | POA: Insufficient documentation

## 2014-03-15 DIAGNOSIS — I2510 Atherosclerotic heart disease of native coronary artery without angina pectoris: Secondary | ICD-10-CM | POA: Diagnosis not present

## 2014-03-15 DIAGNOSIS — C3432 Malignant neoplasm of lower lobe, left bronchus or lung: Secondary | ICD-10-CM | POA: Insufficient documentation

## 2014-03-15 DIAGNOSIS — Z08 Encounter for follow-up examination after completed treatment for malignant neoplasm: Secondary | ICD-10-CM | POA: Diagnosis not present

## 2014-03-15 LAB — CBC WITH DIFFERENTIAL/PLATELET
BASO%: 0.7 % (ref 0.0–2.0)
Basophils Absolute: 0.1 10*3/uL (ref 0.0–0.1)
EOS%: 6.7 % (ref 0.0–7.0)
Eosinophils Absolute: 0.6 10*3/uL — ABNORMAL HIGH (ref 0.0–0.5)
HCT: 37.9 % — ABNORMAL LOW (ref 38.4–49.9)
HGB: 12.8 g/dL — ABNORMAL LOW (ref 13.0–17.1)
LYMPH%: 30.2 % (ref 14.0–49.0)
MCH: 34.2 pg — ABNORMAL HIGH (ref 27.2–33.4)
MCHC: 33.8 g/dL (ref 32.0–36.0)
MCV: 101.3 fL — AB (ref 79.3–98.0)
MONO#: 1 10*3/uL — ABNORMAL HIGH (ref 0.1–0.9)
MONO%: 10.8 % (ref 0.0–14.0)
NEUT%: 51.6 % (ref 39.0–75.0)
NEUTROS ABS: 4.6 10*3/uL (ref 1.5–6.5)
PLATELETS: 208 10*3/uL (ref 140–400)
RBC: 3.74 10*6/uL — ABNORMAL LOW (ref 4.20–5.82)
RDW: 13.9 % (ref 11.0–14.6)
WBC: 8.9 10*3/uL (ref 4.0–10.3)
lymph#: 2.7 10*3/uL (ref 0.9–3.3)

## 2014-03-15 LAB — COMPREHENSIVE METABOLIC PANEL (CC13)
ALT: 19 U/L (ref 0–55)
AST: 19 U/L (ref 5–34)
Albumin: 3.9 g/dL (ref 3.5–5.0)
Alkaline Phosphatase: 64 U/L (ref 40–150)
Anion Gap: 12 mEq/L — ABNORMAL HIGH (ref 3–11)
BILIRUBIN TOTAL: 0.66 mg/dL (ref 0.20–1.20)
BUN: 15.8 mg/dL (ref 7.0–26.0)
CO2: 26 mEq/L (ref 22–29)
CREATININE: 1.3 mg/dL (ref 0.7–1.3)
Calcium: 9.6 mg/dL (ref 8.4–10.4)
Chloride: 108 mEq/L (ref 98–109)
EGFR: 56 mL/min/{1.73_m2} — AB (ref >90)
Glucose: 94 mg/dl (ref 70–140)
Potassium: 4.6 mEq/L (ref 3.5–5.1)
Sodium: 146 mEq/L — ABNORMAL HIGH (ref 136–145)
Total Protein: 7.4 g/dL (ref 6.4–8.3)

## 2014-03-15 MED ORDER — IOHEXOL 300 MG/ML  SOLN
80.0000 mL | Freq: Once | INTRAMUSCULAR | Status: AC | PRN
  Administered 2014-03-15: 80 mL via INTRAVENOUS

## 2014-03-17 ENCOUNTER — Telehealth: Payer: Self-pay | Admitting: Internal Medicine

## 2014-03-17 ENCOUNTER — Ambulatory Visit (HOSPITAL_BASED_OUTPATIENT_CLINIC_OR_DEPARTMENT_OTHER): Payer: BLUE CROSS/BLUE SHIELD | Admitting: Internal Medicine

## 2014-03-17 VITALS — BP 155/95 | HR 67 | Temp 98.2°F | Resp 18 | Ht 67.0 in | Wt 188.4 lb

## 2014-03-17 DIAGNOSIS — C3432 Malignant neoplasm of lower lobe, left bronchus or lung: Secondary | ICD-10-CM

## 2014-03-17 DIAGNOSIS — C3492 Malignant neoplasm of unspecified part of left bronchus or lung: Secondary | ICD-10-CM

## 2014-03-17 NOTE — Telephone Encounter (Signed)
Pt confirmed labs/ov per 02/25 POF, gave pt AVS.... KJ

## 2014-03-17 NOTE — Progress Notes (Signed)
[place] [phone]   Fax:(336) [phone]  OFFICE PROGRESS NOTE  [name], MD [place] [address]  DIAGNOSIS: Stage IB (T2a, N0, M0) non-small cell lung cancer consistent with invasive adenocarcinoma with tumor size 4.1 CM with visceral pleural invasion diagnosed in August 2015.   PRIOR THERAPY:  1) Status post left lower lobectomy with lymph node sampling in August of 2015 under the care of Dr. Roxan Hockey. 2) Adjuvant systemic chemotherapy with carboplatin for AUC of 5 and Alimta 500 MG/M2 every 3 weeks status post 4 cycles.  CURRENT THERAPY: Observation.  INTERVAL HISTORY: Brandon Newton 74 y.o. male returns to the clinic today for follow-up visit accompanied by his interpreter as well as his son-in-law. The patient tolerated the last 4 cycles of his adjuvant chemotherapy fairly well was no significant adverse effects. He denied having any significant fever or chills. He denied having any significant chest pain, shortness of breath, cough or hemoptysis. He denied having any significant weight loss or night sweats. He had repeat CT scan of the chest performed recently and he is here for evaluation and discussion of his scan results.   MEDICAL HISTORY: Past Medical History  Diagnosis Date  . Anxiety   . Hypertension   . Depression   . Cancer     skin    ALLERGIES:  has No Known Allergies.  MEDICATIONS:  Current Outpatient Prescriptions  Medication Sig Dispense Refill  . amiodarone (PACERONE) 200 MG tablet Take 1 tablet (200 mg total) by mouth daily. 30 tablet 0  . aspirin EC 81 MG tablet Take 1 tablet (81 mg total) by mouth daily. 100 tablet 3  . cholecalciferol (VITAMIN D) 1000 UNITS tablet Take 1 tablet (1,000 Units total) by mouth daily. 100 tablet 3  . escitalopram (LEXAPRO) 10 MG tablet Take 1 tablet (10 mg total) by mouth daily. 30 tablet 11  . fluticasone (FLONASE) 50 MCG/ACT nasal spray Place 2 sprays into both  nostrils daily. 16 g 2  . levothyroxine (LEVOTHROID) 25 MCG tablet Take 1 tablet (25 mcg total) by mouth daily. 30 tablet 11  . metoprolol tartrate (LOPRESSOR) 25 MG tablet Take 1 tablet (25 mg total) by mouth 2 (two) times daily. 60 tablet 11  . Multiple Vitamin (MULTIVITAMIN) tablet Take 1 tablet by mouth daily.    Vladimir Faster Glycol-Propyl Glycol (SYSTANE ULTRA OP) Place 1 drop into both eyes daily as needed (for dry eyes).    Marland Kitchen tobramycin-dexamethasone (TOBRADEX) ophthalmic solution Place 2 drops into both eyes every 4 (four) hours while awake. 5 mL 0  . triamterene-hydrochlorothiazide (MAXZIDE-25) 37.5-25 MG per tablet Take 1 tablet by mouth daily. qam 30 tablet 11   No current facility-administered medications for this visit.    SURGICAL HISTORY:  Past Surgical History  Procedure Laterality Date  . Appendectomy    . Video assisted thoracoscopy (vats)/wedge resection Left 09/10/2013    Procedure: LEFT VIDEO ASSISTED THORACOSCOPY ,WEDGE RESECTION LEFT LOWER LOBE LUNG,LEFT LOWER LOBECTOMY, & NODE SAMPLING ;  Surgeon: Melrose Nakayama, MD;  Location: [facility];  Service: Thoracic;  Laterality: Left;    REVIEW OF SYSTEMS:  Constitutional: negative Eyes: negative Ears, nose, mouth, throat, and face: negative Respiratory: negative Cardiovascular: negative Gastrointestinal: negative Genitourinary:negative Integument/breast: negative Hematologic/lymphatic: negative Musculoskeletal:negative Neurological: negative Behavioral/Psych: negative Endocrine: negative Allergic/Immunologic: negative   PHYSICAL EXAMINATION: General appearance: alert, cooperative and no distress Head: Normocephalic, without obvious abnormality, atraumatic Neck: no adenopathy, no JVD, supple, symmetrical,  trachea midline and thyroid not enlarged, symmetric, no tenderness/mass/nodules Lymph nodes: Cervical, supraclavicular, and axillary nodes normal. Resp: clear to auscultation bilaterally Back: symmetric, no  curvature. ROM normal. No CVA tenderness. Cardio: regular rate and rhythm, S1, S2 normal, no murmur, click, rub or gallop GI: soft, non-tender; bowel sounds normal; no masses,  no organomegaly Extremities: extremities normal, atraumatic, no cyanosis or edema Neurologic: Alert and oriented X 3, normal strength and tone. Normal symmetric reflexes. Normal coordination and gait  ECOG PERFORMANCE STATUS: 1 - Symptomatic but completely ambulatory  Blood pressure 155/95, pulse 67, temperature 98.2 F (36.8 C), temperature source Oral, resp. rate 18, height 5\' 7"  (1.702 m), weight 188 lb 6.4 oz (85.458 kg), SpO2 95 %.  LABORATORY DATA: Lab Results  Component Value Date   WBC 8.9 03/15/2014   HGB 12.8* 03/15/2014   HCT 37.9* 03/15/2014   MCV 101.3* 03/15/2014   PLT 208 03/15/2014      Chemistry      Component Value Date/Time   NA 146* 03/15/2014 1455   NA 138 03/11/2014 1004   K 4.6 03/15/2014 1455   K 4.0 03/11/2014 1004   CL 103 03/11/2014 1004   CO2 26 03/15/2014 1455   CO2 27 03/11/2014 1004   BUN 15.8 03/15/2014 1455   BUN 19 03/11/2014 1004   CREATININE 1.3 03/15/2014 1455   CREATININE 1.31 03/11/2014 1004      Component Value Date/Time   CALCIUM 9.6 03/15/2014 1455   CALCIUM 9.7 03/11/2014 1004   ALKPHOS 64 03/15/2014 1455   ALKPHOS 58 03/11/2014 1004   AST 19 03/15/2014 1455   AST 17 03/11/2014 1004   ALT 19 03/15/2014 1455   ALT 15 03/11/2014 1004   BILITOT 0.66 03/15/2014 1455   BILITOT 0.6 03/11/2014 1004       RADIOGRAPHIC STUDIES: Ct Chest W Contrast  03/16/2014   CLINICAL DATA:  Subsequent evaluation of 74 year old male status post left lower lobectomy for adenocarcinoma of the lung. Restaging examination.  EXAM: CT CHEST WITH CONTRAST  TECHNIQUE: Multidetector CT imaging of the chest was performed during intravenous contrast administration.  CONTRAST:  26mL OMNIPAQUE IOHEXOL 300 MG/ML  SOLN  COMPARISON:  Chest CT 08/23/2013.  FINDINGS: Mediastinum/Lymph  Nodes: Heart size is mildly enlarged. There is no significant pericardial fluid, thickening or pericardial calcification. There is atherosclerosis of the thoracic aorta, the great vessels of the mediastinum and the coronary arteries, including calcified atherosclerotic plaque in the left main, left anterior descending, left circumflex and right coronary arteries. No pathologically enlarged mediastinal or hilar lymph nodes. Esophagus is unremarkable in appearance. No axillary lymphadenopathy.  Lungs/Pleura: Compared to the prior examination there has been interval left lower lobectomy. There are 2 nodular appearing areas in the left upper lobe, largest of which measures approximately 16 x 13 x 9 mm (images 28 of series 5, and sagittal image 77). There is also a smaller 7 x 7 mm nodule in the periphery of the left upper lobe (image 24 of series 5) which appears more linear on sagittal reconstructions. Both of these nodules are associated with a linear band of apparent postoperative scarring in the left upper lobe, and both areas are somewhat ill-defined, favored to reflect resolving postoperative inflammation and developing scarring. Some dependent ground-glass attenuation and subpleural reticulation is noted in the right lung, increased compared to the prior examination. No confluent consolidative airspace disease. No pleural effusions. Small amount of pleural thickening in the left hemithorax likely reflects some mild postoperative scarring.  Upper Abdomen: Unremarkable.  Musculoskeletal/Soft Tissues: There are no aggressive appearing lytic or blastic lesions noted in the visualized portions of the skeleton.  IMPRESSION: 1. Status post left lower lobectomy. There are changes that are favored to reflect some mild postoperative scarring in the left hemithorax, with 2 ill-defined nodular areas that are in the left upper lobe associated with an area of linear scarring, as detailed above. Strictly speaking, the  possibility of disease recurrence in these regions is not excluded (although not favored at this time). Close attention on short-term follow-up repeat noncontrast chest CT in 3 months is recommended. 2. Areas of ground-glass attenuation and subpleural reticulation in the dependent portion of the right lung. At this point, the appearance is nonspecific, and may simply be related to relative hypoventilation, however, the possibility of developing interstitial lung disease is not excluded, and close attention at time of followup studies is recommended. 3. Atherosclerosis, including left main and 3 vessel coronary artery disease. Assessment for potential risk factor modification, dietary therapy or pharmacologic therapy may be warranted, if clinically indicated. 4. Mild cardiomegaly.   Electronically Signed   By: Vinnie Langton M.D.   On: 03/16/2014 09:23    ASSESSMENT AND PLAN: this is a very pleasant 74 years old white male with history of stage IB non-small cell lung cancer, adenocarcinoma status post left lower lobectomy with lymph node dissection. He completed adjuvant chemotherapy with carboplatin and Alimta status post 4 cycles. The patient tolerated his treatment fairly well. The recent CT scan of the chest showed no evidence for disease recurrence. I discussed the scan results with the patient and his family. I recommended for him to continue on observation with repeat CT scan of the chest in 3 months. He was advised to call immediately if he has any concerning symptoms in the interval. The patient voices understanding of current disease status and treatment options and is in agreement with the current care plan.  All questions were answered. The patient knows to call the clinic with any problems, questions or concerns. We can certainly see the patient much sooner if necessary.  Disclaimer: This note was dictated with voice recognition software. Similar sounding words can inadvertently be  transcribed and may not be corrected upon review.

## 2014-03-19 ENCOUNTER — Encounter: Payer: Self-pay | Admitting: Internal Medicine

## 2014-03-22 ENCOUNTER — Encounter: Payer: Self-pay | Admitting: Thoracic Surgery (Cardiothoracic Vascular Surgery)

## 2014-03-22 ENCOUNTER — Ambulatory Visit (INDEPENDENT_AMBULATORY_CARE_PROVIDER_SITE_OTHER): Payer: BLUE CROSS/BLUE SHIELD | Admitting: Thoracic Surgery (Cardiothoracic Vascular Surgery)

## 2014-03-22 VITALS — BP 137/82 | HR 67 | Resp 20 | Ht 67.0 in | Wt 188.0 lb

## 2014-03-22 DIAGNOSIS — Z902 Acquired absence of lung [part of]: Secondary | ICD-10-CM

## 2014-03-22 DIAGNOSIS — C3432 Malignant neoplasm of lower lobe, left bronchus or lung: Secondary | ICD-10-CM

## 2014-03-22 DIAGNOSIS — Z9889 Other specified postprocedural states: Secondary | ICD-10-CM

## 2014-03-22 NOTE — Progress Notes (Signed)
HPI:  Brandon Newton returns today for a scheduled follow-up visit.  He is a 74 year old gentleman who underwent a thoracoscopic left lower lobectomy in August 2015 for a stage IB (T2, N0) non-small cell carcinoma. He was treated with adjuvant chemotherapy with cisplatin and Alimta. He had 4 cycles of chemotherapy.  He saw Dr. Julien Nordmann last week and had a CT scan done at that time.  He says he's been feeling well. He lost some hair with chemotherapy but otherwise tolerated it very well. He says he occasionally has some pain when he lies on his left side, but other than that he has no discomfort. He's not had any recent headaches or visual changes or any new bone or joint pain. His weight is stable.  Past Medical History  Diagnosis Date  . Anxiety   . Hypertension   . Depression   . Cancer     skin    Current Outpatient Prescriptions  Medication Sig Dispense Refill  . amiodarone (PACERONE) 200 MG tablet Take 1 tablet (200 mg total) by mouth daily. 30 tablet 0  . aspirin EC 81 MG tablet Take 1 tablet (81 mg total) by mouth daily. 100 tablet 3  . cholecalciferol (VITAMIN D) 1000 UNITS tablet Take 1 tablet (1,000 Units total) by mouth daily. 100 tablet 3  . escitalopram (LEXAPRO) 10 MG tablet Take 1 tablet (10 mg total) by mouth daily. 30 tablet 11  . fluticasone (FLONASE) 50 MCG/ACT nasal spray Place 2 sprays into both nostrils daily. 16 g 2  . levothyroxine (LEVOTHROID) 25 MCG tablet Take 1 tablet (25 mcg total) by mouth daily. 30 tablet 11  . metoprolol tartrate (LOPRESSOR) 25 MG tablet Take 1 tablet (25 mg total) by mouth 2 (two) times daily. 60 tablet 11  . Multiple Vitamin (MULTIVITAMIN) tablet Take 1 tablet by mouth daily.    Brandon Newton Glycol-Propyl Glycol (SYSTANE ULTRA OP) Place 1 drop into both eyes daily as needed (for dry eyes).    Marland Kitchen tobramycin-dexamethasone (TOBRADEX) ophthalmic solution Place 2 drops into both eyes every 4 (four) hours while awake. 5 mL 0  .  triamterene-hydrochlorothiazide (MAXZIDE-25) 37.5-25 MG per tablet Take 1 tablet by mouth daily. qam 30 tablet 11   No current facility-administered medications for this visit.    Physical Exam BP 137/82 mmHg  Pulse 67  Resp 20  Ht 5\' 7"  (1.702 m)  Wt 188 lb (85.276 kg)  BMI 29.44 kg/m2  SpO56 72% 74 year old male in no acute distress Alert and oriented 3 with no focal deficits No cervical or supraclavicular adenopathy Lungs diminished breath sounds at left base, otherwise clear Cardiac regular rate and rhythm  Diagnostic Tests: CT scan from 03/15/2014 reviewed.  CT CHEST WITH CONTRAST  TECHNIQUE: Multidetector CT imaging of the chest was performed during intravenous contrast administration.  CONTRAST: 67mL OMNIPAQUE IOHEXOL 300 MG/ML SOLN  COMPARISON: Chest CT 08/23/2013.  FINDINGS: Mediastinum/Lymph Nodes: Heart size is mildly enlarged. There is no significant pericardial fluid, thickening or pericardial calcification. There is atherosclerosis of the thoracic aorta, the great vessels of the mediastinum and the coronary arteries, including calcified atherosclerotic plaque in the left main, left anterior descending, left circumflex and right coronary arteries. No pathologically enlarged mediastinal or hilar lymph nodes. Esophagus is unremarkable in appearance. No axillary lymphadenopathy.  Lungs/Pleura: Compared to the prior examination there has been interval left lower lobectomy. There are 2 nodular appearing areas in the left upper lobe, largest of which measures approximately 16 x 13 x  9 mm (images 28 of series 5, and sagittal image 77). There is also a smaller 7 x 7 mm nodule in the periphery of the left upper lobe (image 24 of series 5) which appears more linear on sagittal reconstructions. Both of these nodules are associated with a linear band of apparent postoperative scarring in the left upper lobe, and both areas are somewhat ill-defined, favored  to reflect resolving postoperative inflammation and developing scarring. Some dependent ground-glass attenuation and subpleural reticulation is noted in the right lung, increased compared to the prior examination. No confluent consolidative airspace disease. No pleural effusions. Small amount of pleural thickening in the left hemithorax likely reflects some mild postoperative scarring.  Upper Abdomen: Unremarkable.  Musculoskeletal/Soft Tissues: There are no aggressive appearing lytic or blastic lesions noted in the visualized portions of the skeleton.  IMPRESSION: 1. Status post left lower lobectomy. There are changes that are favored to reflect some mild postoperative scarring in the left hemithorax, with 2 ill-defined nodular areas that are in the left upper lobe associated with an area of linear scarring, as detailed above. Strictly speaking, the possibility of disease recurrence in these regions is not excluded (although not favored at this time). Close attention on short-term follow-up repeat noncontrast chest CT in 3 months is recommended. 2. Areas of ground-glass attenuation and subpleural reticulation in the dependent portion of the right lung. At this point, the appearance is nonspecific, and may simply be related to relative hypoventilation, however, the possibility of developing interstitial lung disease is not excluded, and close attention at time of followup studies is recommended. 3. Atherosclerosis, including left main and 3 vessel coronary artery disease. Assessment for potential risk factor modification, dietary therapy or pharmacologic therapy may be warranted, if clinically indicated. 4. Mild cardiomegaly.   Electronically Signed  By: Vinnie Langton M.D.  On: 03/16/2014 09:23  I personally reviewed the CT images and make recommendations based on my interpretation  Impression: 74 year old man with stage IB non-small cell carcinoma. He was treated  with thoracoscopic left lower lobectomy followed by adjuvant chemotherapy with cisplatin and Alimta. This time he is doing well with no evidence recurrent disease. He does have some abnormalities on the chest CT. I suspect this mostly scarring and changes related to his surgery. Dr. Julien Nordmann has ordered another CT in 3 months to follow-up those areas.   Plan: I will plan to see Mr. Jacob back in 4 months. Since he will have a CT scan in 3 months we will not order any additional testing at that time   Remo Lipps C. Roxan Hockey, MD [facility] [phone]

## 2014-06-14 ENCOUNTER — Other Ambulatory Visit (HOSPITAL_BASED_OUTPATIENT_CLINIC_OR_DEPARTMENT_OTHER): Payer: BLUE CROSS/BLUE SHIELD

## 2014-06-14 DIAGNOSIS — C3432 Malignant neoplasm of lower lobe, left bronchus or lung: Secondary | ICD-10-CM

## 2014-06-14 DIAGNOSIS — C3492 Malignant neoplasm of unspecified part of left bronchus or lung: Secondary | ICD-10-CM

## 2014-06-14 LAB — COMPREHENSIVE METABOLIC PANEL (CC13)
ALK PHOS: 71 U/L (ref 40–150)
ALT: 18 U/L (ref 0–55)
AST: 15 U/L (ref 5–34)
Albumin: 3.9 g/dL (ref 3.5–5.0)
Anion Gap: 13 mEq/L — ABNORMAL HIGH (ref 3–11)
BUN: 18.8 mg/dL (ref 7.0–26.0)
CO2: 24 mEq/L (ref 22–29)
Calcium: 9.2 mg/dL (ref 8.4–10.4)
Chloride: 104 mEq/L (ref 98–109)
Creatinine: 1.5 mg/dL — ABNORMAL HIGH (ref 0.7–1.3)
EGFR: 48 mL/min/{1.73_m2} — ABNORMAL LOW (ref >90)
Glucose: 110 mg/dl (ref 70–140)
Potassium: 3.9 mEq/L (ref 3.5–5.1)
SODIUM: 141 meq/L (ref 136–145)
Total Bilirubin: 0.78 mg/dL (ref 0.20–1.20)
Total Protein: 7.3 g/dL (ref 6.4–8.3)

## 2014-06-14 LAB — CBC WITH DIFFERENTIAL/PLATELET
BASO%: 0.7 % (ref 0.0–2.0)
Basophils Absolute: 0.1 10*3/uL (ref 0.0–0.1)
EOS%: 3.1 % (ref 0.0–7.0)
Eosinophils Absolute: 0.3 10*3/uL (ref 0.0–0.5)
HCT: 41.4 % (ref 38.4–49.9)
HGB: 14.3 g/dL (ref 13.0–17.1)
LYMPH%: 25.5 % (ref 14.0–49.0)
MCH: 32.2 pg (ref 27.2–33.4)
MCHC: 34.5 g/dL (ref 32.0–36.0)
MCV: 93.2 fL (ref 79.3–98.0)
MONO#: 0.9 10*3/uL (ref 0.1–0.9)
MONO%: 11.3 % (ref 0.0–14.0)
NEUT#: 4.8 10*3/uL (ref 1.5–6.5)
NEUT%: 59.4 % (ref 39.0–75.0)
Platelets: 199 10*3/uL (ref 140–400)
RBC: 4.44 10*6/uL (ref 4.20–5.82)
RDW: 14.1 % (ref 11.0–14.6)
WBC: 8 10*3/uL (ref 4.0–10.3)
lymph#: 2.1 10*3/uL (ref 0.9–3.3)

## 2014-06-16 ENCOUNTER — Encounter: Payer: Self-pay | Admitting: Internal Medicine

## 2014-06-16 ENCOUNTER — Ambulatory Visit (HOSPITAL_COMMUNITY): Payer: BLUE CROSS/BLUE SHIELD

## 2014-06-16 ENCOUNTER — Telehealth: Payer: Self-pay | Admitting: Internal Medicine

## 2014-06-16 ENCOUNTER — Ambulatory Visit (HOSPITAL_BASED_OUTPATIENT_CLINIC_OR_DEPARTMENT_OTHER): Payer: BLUE CROSS/BLUE SHIELD | Admitting: Internal Medicine

## 2014-06-16 VITALS — BP 135/96 | HR 63 | Temp 98.2°F | Resp 17 | Ht 67.0 in

## 2014-06-16 DIAGNOSIS — C3432 Malignant neoplasm of lower lobe, left bronchus or lung: Secondary | ICD-10-CM | POA: Diagnosis not present

## 2014-06-16 DIAGNOSIS — C3492 Malignant neoplasm of unspecified part of left bronchus or lung: Secondary | ICD-10-CM

## 2014-06-16 NOTE — Telephone Encounter (Signed)
per pof to sch  appt-r/s CT chest-gave pt copy of sch-adv NPO 4 hrs prior to appt-pt understood

## 2014-06-16 NOTE — Progress Notes (Signed)
[place] Telephone:(336) [phone]   Fax:(336) [phone]  OFFICE PROGRESS NOTE  Brandon Kehr, MD [place] [address]  DIAGNOSIS: Stage IB (T2a, N0, M0) non-small cell lung cancer consistent with invasive adenocarcinoma with tumor size 4.1 CM with visceral pleural invasion diagnosed in August 2015.   PRIOR THERAPY:  1) Status post left lower lobectomy with lymph node sampling in August of 2015 under the care of Dr. Roxan Hockey. 2) Adjuvant systemic chemotherapy with carboplatin for AUC of 5 and Alimta 500 MG/M2 every 3 weeks status post 4 cycles.  CURRENT THERAPY: Observation.  INTERVAL HISTORY: Brandon Newton 74 y.o. male returns to the clinic today for follow-up visit accompanied by his interpreter as well as his son-in-law. The patient is currently on observation and has no specific complaints. He recently broke his right leg after a fall from a ladder. He denied having any significant fever or chills. He denied having any significant chest pain, shortness of breath, cough or hemoptysis. He denied having any significant weight loss or night sweats. He was supposed to have repeat CT scan of the chest before this visit but there was miscommunication regarding his scan appointment. He is here for evaluation.   MEDICAL HISTORY: Past Medical History  Diagnosis Date  . Anxiety   . Hypertension   . Depression   . Cancer     skin    ALLERGIES:  has No Known Allergies.  MEDICATIONS:  Current Outpatient Prescriptions  Medication Sig Dispense Refill  . amiodarone (PACERONE) 200 MG tablet Take 1 tablet (200 mg total) by mouth daily. 30 tablet 0  . aspirin EC 81 MG tablet Take 1 tablet (81 mg total) by mouth daily. 100 tablet 3  . cholecalciferol (VITAMIN D) 1000 UNITS tablet Take 1 tablet (1,000 Units total) by mouth daily. 100 tablet 3  . escitalopram (LEXAPRO) 10 MG tablet Take 1 tablet (10 mg total) by mouth daily. 30 tablet 11  . fluticasone  (FLONASE) 50 MCG/ACT nasal spray Place 2 sprays into both nostrils daily. 16 g 2  . levothyroxine (LEVOTHROID) 25 MCG tablet Take 1 tablet (25 mcg total) by mouth daily. 30 tablet 11  . metoprolol tartrate (LOPRESSOR) 25 MG tablet Take 1 tablet (25 mg total) by mouth 2 (two) times daily. 60 tablet 11  . Multiple Vitamin (MULTIVITAMIN) tablet Take 1 tablet by mouth daily.    Vladimir Faster Glycol-Propyl Glycol (SYSTANE ULTRA OP) Place 1 drop into both eyes daily as needed (for dry eyes).    Marland Kitchen tobramycin-dexamethasone (TOBRADEX) ophthalmic solution Place 2 drops into both eyes every 4 (four) hours while awake. 5 mL 0  . triamterene-hydrochlorothiazide (MAXZIDE-25) 37.5-25 MG per tablet Take 1 tablet by mouth daily. qam 30 tablet 11   No current facility-administered medications for this visit.    SURGICAL HISTORY:  Past Surgical History  Procedure Laterality Date  . Appendectomy    . Video assisted thoracoscopy (vats)/wedge resection Left 09/10/2013    Procedure: LEFT VIDEO ASSISTED THORACOSCOPY ,WEDGE RESECTION LEFT LOWER LOBE LUNG,LEFT LOWER LOBECTOMY, & NODE SAMPLING ;  Surgeon: Melrose Nakayama, MD;  Location: [facility];  Service: Thoracic;  Laterality: Left;    REVIEW OF SYSTEMS:  A comprehensive review of systems was negative.   PHYSICAL EXAMINATION: General appearance: alert, cooperative and no distress Head: Normocephalic, without obvious abnormality, atraumatic Neck: no adenopathy, no JVD, supple, symmetrical, trachea midline and thyroid not enlarged, symmetric, no tenderness/mass/nodules Lymph nodes: Cervical, supraclavicular, and  axillary nodes normal. Resp: clear to auscultation bilaterally Back: symmetric, no curvature. ROM normal. No CVA tenderness. Cardio: regular rate and rhythm, S1, S2 normal, no murmur, click, rub or gallop GI: soft, non-tender; bowel sounds normal; no masses,  no organomegaly Extremities: extremities normal, atraumatic, no cyanosis or edema Neurologic:  Alert and oriented X 3, normal strength and tone. Normal symmetric reflexes. Normal coordination and gait  ECOG PERFORMANCE STATUS: 1 - Symptomatic but completely ambulatory  Blood pressure 135/96, pulse 63, temperature 98.2 F (36.8 C), temperature source Oral, resp. rate 17, height '5\' 7"'$  (1.702 m), SpO2 97 %.  LABORATORY DATA: Lab Results  Component Value Date   WBC 8.0 06/14/2014   HGB 14.3 06/14/2014   HCT 41.4 06/14/2014   MCV 93.2 06/14/2014   PLT 199 06/14/2014      Chemistry      Component Value Date/Time   NA 141 06/14/2014 1126   NA 138 03/11/2014 1004   K 3.9 06/14/2014 1126   K 4.0 03/11/2014 1004   CL 103 03/11/2014 1004   CO2 24 06/14/2014 1126   CO2 27 03/11/2014 1004   BUN 18.8 06/14/2014 1126   BUN 19 03/11/2014 1004   CREATININE 1.5* 06/14/2014 1126   CREATININE 1.31 03/11/2014 1004      Component Value Date/Time   CALCIUM 9.2 06/14/2014 1126   CALCIUM 9.7 03/11/2014 1004   ALKPHOS 71 06/14/2014 1126   ALKPHOS 58 03/11/2014 1004   AST 15 06/14/2014 1126   AST 17 03/11/2014 1004   ALT 18 06/14/2014 1126   ALT 15 03/11/2014 1004   BILITOT 0.78 06/14/2014 1126   BILITOT 0.6 03/11/2014 1004       RADIOGRAPHIC STUDIES:  ASSESSMENT AND PLAN: this is a very pleasant 74 years old white male with history of stage IB non-small cell lung cancer, adenocarcinoma status post left lower lobectomy with lymph node dissection. He completed adjuvant chemotherapy with carboplatin and Alimta status post 4 cycles. The patient is doing fine today with no specific complaints. I will arrange for the patient to have repeat CT scan of the chest and few days. If the scan is negative, he will continue on observation and I will see him back for follow-up visit in 6 months with repeat CT scan of the chest. He was advised to call immediately if he has any concerning symptoms in the interval. The patient voices understanding of current disease status and treatment options and is  in agreement with the current care plan.  All questions were answered. The patient knows to call the clinic with any problems, questions or concerns. We can certainly see the patient much sooner if necessary.  Disclaimer: This note was dictated with voice recognition software. Similar sounding words can inadvertently be transcribed and may not be corrected upon review.

## 2014-06-21 ENCOUNTER — Ambulatory Visit (HOSPITAL_COMMUNITY)
Admission: RE | Admit: 2014-06-21 | Discharge: 2014-06-21 | Disposition: A | Discharge status: Home / Self Care | Payer: BLUE CROSS/BLUE SHIELD | Source: Ambulatory Visit | Attending: Internal Medicine | Admitting: Internal Medicine

## 2014-06-21 ENCOUNTER — Encounter (HOSPITAL_COMMUNITY): Payer: Self-pay

## 2014-06-21 DIAGNOSIS — C3492 Malignant neoplasm of unspecified part of left bronchus or lung: Secondary | ICD-10-CM | POA: Insufficient documentation

## 2014-06-21 MED ORDER — IOHEXOL 300 MG/ML  SOLN
100.0000 mL | Freq: Once | INTRAMUSCULAR | Status: AC | PRN
  Administered 2014-06-21: 80 mL via INTRAVENOUS

## 2014-06-27 ENCOUNTER — Telehealth: Payer: Self-pay | Admitting: *Deleted

## 2014-06-27 ENCOUNTER — Encounter: Payer: Self-pay | Admitting: Internal Medicine

## 2014-06-27 NOTE — Telephone Encounter (Signed)
VM message from pt's daughter, requesting results of CT scan done 06/22/14.

## 2014-06-27 NOTE — Telephone Encounter (Signed)
Looks good. Follow up in 6 months as scheduled

## 2014-06-28 ENCOUNTER — Telehealth: Payer: Self-pay | Admitting: *Deleted

## 2014-06-28 NOTE — Telephone Encounter (Signed)
Called Brandon Newton, after speaking with MD, clarified her concern regarding the 2 lung nodules on recent scan. Per MD, " the 2 nodules were there prior, 1 is better and 1 is resolving. This is very good news." Brandon Newton verbalized understanding, no further concerns. Thanked me for the clarification and call back

## 2014-06-29 NOTE — Telephone Encounter (Signed)
Brandon Newton called asking to "speak with Dr. Julien Nordmann directly.  I do not know why he says the nodules are better and resolving.  The nodules are not present on previous scans per MyChart."  Reviewed MyChart results list.  [name] is reading the 03-15-2014 scan.  The 06-21-2014 scan will be released 07-06-2014.  This eased Brandon Newton and she thanked me for clarifying results.  No further questions.

## 2014-07-22 ENCOUNTER — Other Ambulatory Visit: Payer: Self-pay | Admitting: Internal Medicine

## 2014-07-26 ENCOUNTER — Ambulatory Visit (INDEPENDENT_AMBULATORY_CARE_PROVIDER_SITE_OTHER): Payer: BLUE CROSS/BLUE SHIELD | Admitting: Thoracic Surgery (Cardiothoracic Vascular Surgery)

## 2014-07-26 ENCOUNTER — Encounter: Payer: Self-pay | Admitting: Thoracic Surgery (Cardiothoracic Vascular Surgery)

## 2014-07-26 ENCOUNTER — Telehealth: Payer: Self-pay | Admitting: Internal Medicine

## 2014-07-26 VITALS — BP 128/76 | HR 70 | Resp 20 | Ht 67.0 in | Wt 185.0 lb

## 2014-07-26 DIAGNOSIS — C3432 Malignant neoplasm of lower lobe, left bronchus or lung: Secondary | ICD-10-CM

## 2014-07-26 DIAGNOSIS — Z902 Acquired absence of lung [part of]: Secondary | ICD-10-CM

## 2014-07-26 DIAGNOSIS — Z9889 Other specified postprocedural states: Secondary | ICD-10-CM | POA: Diagnosis not present

## 2014-07-26 MED ORDER — ESCITALOPRAM OXALATE 10 MG PO TABS: 10.0000 mg | ORAL_TABLET | Freq: Every day | ORAL | Status: AC

## 2014-07-26 NOTE — Telephone Encounter (Signed)
Patient is needing a refill for escitalopram (LEXAPRO) 10 MG tablet [[id number]. Pharmacy is [facility] [address]

## 2014-07-26 NOTE — Progress Notes (Signed)
      [address]       ,[place] [address]             [phone]       HPI:  Mr. Brandon Newton is a 74 yo man who had a thoracoscopic LLL for a stage IB(T2,N0) non-small cell carcinoma in August 2015. He had chemotherapy postoperatively, which he tolerated well. He was last seen in the office in March and was doing well at that time. He saw Dr. Julien Nordmann then had a CT scan in May.  He feels well. His weight is stable. His appetite is good. He has not had any chest pain or shortness of breath. He has not had any unusual headaches or visual changes. He denies any new or unusual bone or joint pain.  History and review of systems done via interpreter  Past Medical History  Diagnosis Date  . Anxiety   . Hypertension   . Depression   . Cancer     skin   lung cancer stage IB resected August 2015   Current Outpatient Prescriptions  Medication Sig Dispense Refill  . aspirin EC 81 MG tablet Take 1 tablet (81 mg total) by mouth daily. 100 tablet 3  . escitalopram (LEXAPRO) 10 MG tablet Take 1 tablet (10 mg total) by mouth daily. 30 tablet 11  . fluticasone (FLONASE) 50 MCG/ACT nasal spray Place 2 sprays into both nostrils daily. 16 g 2  . levothyroxine (LEVOTHROID) 25 MCG tablet Take 1 tablet (25 mcg total) by mouth daily. 30 tablet 11  . metoprolol tartrate (LOPRESSOR) 25 MG tablet Take 1 tablet (25 mg total) by mouth 2 (two) times daily. 60 tablet 11  . Multiple Vitamin (MULTIVITAMIN) tablet Take 1 tablet by mouth daily.    Vladimir Faster Glycol-Propyl Glycol (SYSTANE ULTRA OP) Place 1 drop into both eyes daily as needed (for dry eyes).    Marland Kitchen tobramycin-dexamethasone (TOBRADEX) ophthalmic solution Place 2 drops into both eyes every 4 (four) hours while awake. 5 mL 0  . triamterene-hydrochlorothiazide (MAXZIDE-25) 37.5-25 MG per tablet TAKE ONE TABLET BY MOUTH EVERY MORNING 30 tablet 5   No current facility-administered medications for this visit.    Physical Exam BP 128/76  mmHg  Pulse 70  Resp 20  Ht '5\' 7"'$  (1.702 m)  Wt 185 lb (83.915 kg)  BMI 28.97 kg/m2  SpO5 76% 74 year old man in no acute distress Well-developed well-nourished Alert and oriented 3, no focal neurologic deficits No cervical or subclavicular adenopathy\ Cardiac regular rate and rhythm normal S1 and S2 Lungs diminished at left base, otherwise clear Incisions well healed  Diagnostic Tests: CT scan from May 2016 reviewed. Postoperative changes noted. Improvement of left upper lobe abnormalities consistent with scarring  Impression: 74 year old man who is now almost a year out from a thoracoscopic left lower lobectomy for a stage IB non-small cell carcinoma. He was treated with postoperative adjuvant chemotherapy. He has no evidence of recurrent disease. He is scheduled to see Dr. Julien Nordmann in November to have a repeat CT scan. I'll plan to see him back in one year.  Plan: Follow-up with Dr. Julien Nordmann as scheduled  Return in one year  Melrose Nakayama, MD [facility] [phone]

## 2014-07-26 NOTE — Telephone Encounter (Signed)
Rf sent. See meds.

## 2014-11-28 ENCOUNTER — Ambulatory Visit (INDEPENDENT_AMBULATORY_CARE_PROVIDER_SITE_OTHER)
Admission: RE | Admit: 2014-11-28 | Discharge: 2014-11-28 | Disposition: A | Discharge status: Home / Self Care | Payer: BLUE CROSS/BLUE SHIELD | Source: Ambulatory Visit | Attending: Internal Medicine | Admitting: Internal Medicine

## 2014-11-28 ENCOUNTER — Ambulatory Visit (INDEPENDENT_AMBULATORY_CARE_PROVIDER_SITE_OTHER): Payer: BLUE CROSS/BLUE SHIELD | Admitting: Internal Medicine

## 2014-11-28 ENCOUNTER — Other Ambulatory Visit (INDEPENDENT_AMBULATORY_CARE_PROVIDER_SITE_OTHER): Payer: BLUE CROSS/BLUE SHIELD

## 2014-11-28 ENCOUNTER — Encounter: Payer: Self-pay | Admitting: Internal Medicine

## 2014-11-28 VITALS — BP 124/86 | HR 92

## 2014-11-28 DIAGNOSIS — R0789 Other chest pain: Secondary | ICD-10-CM

## 2014-11-28 DIAGNOSIS — E038 Other specified hypothyroidism: Secondary | ICD-10-CM

## 2014-11-28 DIAGNOSIS — R0600 Dyspnea, unspecified: Secondary | ICD-10-CM

## 2014-11-28 DIAGNOSIS — R197 Diarrhea, unspecified: Secondary | ICD-10-CM | POA: Diagnosis not present

## 2014-11-28 DIAGNOSIS — E032 Hypothyroidism due to medicaments and other exogenous substances: Secondary | ICD-10-CM

## 2014-11-28 DIAGNOSIS — T462X1A Poisoning by other antidysrhythmic drugs, accidental (unintentional), initial encounter: Secondary | ICD-10-CM

## 2014-11-28 LAB — CBC WITH DIFFERENTIAL/PLATELET
BASOS ABS: 0.1 10*3/uL (ref 0.0–0.1)
Basophils Relative: 0.5 % (ref 0.0–3.0)
Eosinophils Absolute: 0.3 10*3/uL (ref 0.0–0.7)
Eosinophils Relative: 1.8 % (ref 0.0–5.0)
HCT: 48.7 % (ref 39.0–52.0)
HEMOGLOBIN: 16.5 g/dL (ref 13.0–17.0)
Lymphocytes Relative: 20.5 % (ref 12.0–46.0)
Lymphs Abs: 3 10*3/uL (ref 0.7–4.0)
MCHC: 33.9 g/dL (ref 30.0–36.0)
MCV: 92.2 fl (ref 78.0–100.0)
MONO ABS: 1.5 10*3/uL — AB (ref 0.1–1.0)
Monocytes Relative: 10.4 % (ref 3.0–12.0)
NEUTROS PCT: 66.8 % (ref 43.0–77.0)
Neutro Abs: 9.7 10*3/uL — ABNORMAL HIGH (ref 1.4–7.7)
Platelets: 290 10*3/uL (ref 150.0–400.0)
RBC: 5.28 Mil/uL (ref 4.22–5.81)
RDW: 13.7 % (ref 11.5–15.5)
WBC: 14.5 10*3/uL — ABNORMAL HIGH (ref 4.0–10.5)

## 2014-11-28 MED ORDER — UMECLIDINIUM-VILANTEROL 62.5-25 MCG/INH IN AEPB: 1.0000 | INHALATION_SPRAY | Freq: Every day | RESPIRATORY_TRACT | Status: AC

## 2014-11-28 NOTE — Patient Instructions (Signed)
Xarelto one a day

## 2014-11-28 NOTE — Assessment & Plan Note (Signed)
TSH 

## 2014-11-28 NOTE — Assessment & Plan Note (Addendum)
11/16 broad diff dx EKG, CXR Labs incl cardiac markers and D dimer CT to r/o PE Empiric Xarelto to take 20 mg today To ER i=by ambulance if worse Start Anoro daily

## 2014-11-28 NOTE — Progress Notes (Signed)
Pre visit review using our clinic review tool, if applicable. No additional management support is needed unless otherwise documented below in the visit note. 

## 2014-11-28 NOTE — Assessment & Plan Note (Signed)
1/16 one episode Imodium if needed

## 2014-11-28 NOTE — Assessment & Plan Note (Signed)
11/16 broad diff dx EKG, CXR Labs incl cardiac markers and D dimer CT to r/o PE Empiric Xarelto to take 20 mg today To ER i=by ambulance if worse Start Anoro daily

## 2014-11-28 NOTE — Progress Notes (Signed)
Subjective:  Patient ID: Brandon Newton, male    DOB: 10-28-1940  Age: 74 y.o. MRN: [id number]  CC: Shortness of Breath and Chest Pain   HPI Brandon Newton presents for worsening SOB w/wheezing, no appetite, indigestion. He is a poor historian. Pt drove to [place]  And back 1 wk ago  No CP at present. C/o rib pain - chronic. C/o nausea, upset stomach and gas w/diarrhea - all since [place]. Pt had diarrhea x 1 today. No LOC  Outpatient Prescriptions Prior to Visit  Medication Sig Dispense Refill  . aspirin EC 81 MG tablet Take 1 tablet (81 mg total) by mouth daily. 100 tablet 3  . escitalopram (LEXAPRO) 10 MG tablet Take 1 tablet (10 mg total) by mouth daily. 30 tablet 5  . fluticasone (FLONASE) 50 MCG/ACT nasal spray Place 2 sprays into both nostrils daily. 16 g 2  . levothyroxine (LEVOTHROID) 25 MCG tablet Take 1 tablet (25 mcg total) by mouth daily. 30 tablet 11  . metoprolol tartrate (LOPRESSOR) 25 MG tablet Take 1 tablet (25 mg total) by mouth 2 (two) times daily. 60 tablet 11  . Multiple Vitamin (MULTIVITAMIN) tablet Take 1 tablet by mouth daily.    Vladimir Faster Glycol-Propyl Glycol (SYSTANE ULTRA OP) Place 1 drop into both eyes daily as needed (for dry eyes).    Marland Kitchen tobramycin-dexamethasone (TOBRADEX) ophthalmic solution Place 2 drops into both eyes every 4 (four) hours while awake. 5 mL 0  . triamterene-hydrochlorothiazide (MAXZIDE-25) 37.5-25 MG per tablet TAKE ONE TABLET BY MOUTH EVERY MORNING 30 tablet 5   No facility-administered medications prior to visit.    ROS Review of Systems  Constitutional: Positive for fatigue. Negative for fever, appetite change and unexpected weight change.  HENT: Negative for congestion, hearing loss, nosebleeds, sneezing, sore throat and trouble swallowing.   Eyes: Negative for itching and visual disturbance.  Respiratory: Positive for shortness of breath and wheezing. Negative for cough.   Cardiovascular: Positive for chest pain. Negative for  palpitations and leg swelling.  Gastrointestinal: Negative for nausea, diarrhea, blood in stool and abdominal distention.  Genitourinary: Negative for frequency, hematuria and decreased urine volume.  Musculoskeletal: Negative for back pain, joint swelling, gait problem and neck pain.  Skin: Negative for rash.  Neurological: Positive for weakness. Negative for dizziness, tremors and speech difficulty.  Psychiatric/Behavioral: Negative for suicidal ideas, sleep disturbance, dysphoric mood and agitation. The patient is not nervous/anxious.     Objective:  BP 124/86 mmHg  Pulse 92  SpO2 98%  BP Readings from Last 3 Encounters:  11/28/14 124/86  07/26/14 128/76  06/16/14 135/96    Wt Readings from Last 3 Encounters:  07/26/14 185 lb (83.915 kg)  03/22/14 188 lb (85.276 kg)  03/17/14 188 lb 6.4 oz (85.458 kg)    Physical Exam  Constitutional: He is oriented to person, place, and time. He appears well-developed. No distress.  NAD  HENT:  Mouth/Throat: Oropharynx is clear and moist.  Eyes: Conjunctivae are normal. Pupils are equal, round, and reactive to light.  Neck: Normal range of motion. No JVD present. No thyromegaly present.  Cardiovascular: Normal rate, regular rhythm, normal heart sounds and intact distal pulses.  Exam reveals no gallop and no friction rub.   No murmur heard. Pulmonary/Chest: Effort normal and breath sounds normal. No respiratory distress. He has no wheezes. He has no rales. He exhibits no tenderness.  Abdominal: Soft. Bowel sounds are normal. He exhibits no distension and no mass. There is no tenderness. There is  no rebound and no guarding.  Musculoskeletal: Normal range of motion. He exhibits no edema or tenderness.  Lymphadenopathy:    He has no cervical adenopathy.  Neurological: He is alert and oriented to person, place, and time. He has normal reflexes. No cranial nerve deficit. He exhibits normal muscle tone. He displays a negative Romberg sign.  Coordination and gait normal.  Skin: Skin is warm and dry. No rash noted.  Psychiatric: He has a normal mood and affect. His behavior is normal. Judgment and thought content normal.  Obese NAD Slight tachy LEs NT B  Lab Results  Component Value Date   WBC 8.0 06/14/2014   HGB 14.3 06/14/2014   HCT 41.4 06/14/2014   PLT 199 06/14/2014   GLUCOSE 110 06/14/2014   ALT 18 06/14/2014   AST 15 06/14/2014   NA 141 06/14/2014   K 3.9 06/14/2014   CL 103 03/11/2014   CREATININE 1.5* 06/14/2014   BUN 18.8 06/14/2014   CO2 24 06/14/2014   TSH 8.31* 03/11/2014   PSA 5.69* 07/15/2013   INR 1.03 09/08/2013    Ct Chest W Contrast  06/22/2014  CLINICAL DATA:  Subsequent encounter for left lung adenocarcinoma. EXAM: CT CHEST WITH CONTRAST TECHNIQUE: Multidetector CT imaging of the chest was performed during intravenous contrast administration. CONTRAST:  101m OMNIPAQUE IOHEXOL 300 MG/ML  SOLN COMPARISON:  03/15/2014.  A/03/2013. FINDINGS: Mediastinum / Lymph Nodes: There is no axillary lymphadenopathy. No mediastinal or hilar lymphadenopathy. Thoracic esophagus is unremarkable. Heart is upper normal to mildly increased in size. Coronary artery calcification is noted. No pericardial effusion. Lungs / Pleura: Lung volumes a low. Areas of chronic atelectasis and/ or scarring in the lower lungs again noted. Surgical changes in the inferior left hilum are compatible with prior left lower lobectomy. 6 mm posteromedial right upper lobe pulmonary nodule (image 17 series 5) is unchanged. The nodular areas in question involving the left upper lobe on the previous study are less prominent in the interval. The larger of the 2 was is 16 x 13 x 9 mm area of nodular ground-glass attenuation which is become more ill-defined and less confluent in the interval. As such, evolving scar is favored. The second upper lobe lesion on the left was a 7 mm nodular lesion which has resolved completely in the interval. No new or  enlarging pulmonary parenchymal nodule or mass is evident. MSK / Soft Tissues: Bone windows reveal no worrisome lytic or sclerotic osseous lesions. Upper Abdomen:  Unremarkable. IMPRESSION: 1. Status post left lower lobectomy. Persistent areas of probable scar in the left lung. 2. One of the 2 nodular areas described on the previous study in the residual left lung has become less conspicuous in the interval and the other has resolved completely. 3. No new or progressive disease on the current study. Electronically Signed   By: EMisty StanleyM.D.   On: 06/22/2014 10:01   EKG - no acute chages  I personally provided Anoro inhaler use teaching. After the teaching patient was able to demonstrate it's use effectively. All questions were answered   Assessment & Plan:   NMadelinewas seen today for shortness of breath and chest pain.  Diagnoses and all orders for this visit:  Other chest pain -     EKG 12-Lead -     Basic metabolic panel; Future -     CBC with Differential/Platelet; Future -     Hepatic function panel; Future -     TSH; Future -  D-dimer, quantitative (not at [facility]); Future -     Troponin I; Future -     CKMB; Future -     CT Angio Chest W/Cm &/Or Wo Cm; Future -     DG Chest 2 View  Dyspnea -     Basic metabolic panel; Future -     CBC with Differential/Platelet; Future -     Hepatic function panel; Future -     TSH; Future -     D-dimer, quantitative (not at [facility]); Future -     Troponin I; Future -     CKMB; Future -     CT Angio Chest W/Cm &/Or Wo Cm; Future -     DG Chest 2 View  Chest pain, atypical  Other orders -     Umeclidinium-Vilanterol (ANORO ELLIPTA) 62.5-25 MCG/INH AEPB; Inhale 1 Act into the lungs daily.   I am having Brandon Newton start on Umeclidinium-Vilanterol. I am also having him maintain his aspirin EC, Polyethyl Glycol-Propyl Glycol (SYSTANE ULTRA OP), tobramycin-dexamethasone, fluticasone, metoprolol tartrate, levothyroxine, multivitamin,  triamterene-hydrochlorothiazide, and escitalopram.  Meds ordered this encounter  Medications  . Umeclidinium-Vilanterol (ANORO ELLIPTA) 62.5-25 MCG/INH AEPB    Sig: Inhale 1 Act into the lungs daily.    Dispense:  1 each    Refill:  11     Follow-up: No Follow-up on file.  [name], MD

## 2014-11-29 ENCOUNTER — Emergency Department (HOSPITAL_COMMUNITY): Payer: BLUE CROSS/BLUE SHIELD

## 2014-11-29 ENCOUNTER — Inpatient Hospital Stay (HOSPITAL_COMMUNITY)
Admission: EM | Admit: 2014-11-29 | Discharge: 2014-12-01 | DRG: 181 | Disposition: A | Discharge status: Home / Self Care | Payer: BLUE CROSS/BLUE SHIELD | Attending: Family Medicine | Admitting: Family Medicine

## 2014-11-29 ENCOUNTER — Telehealth: Payer: Self-pay | Admitting: Internal Medicine

## 2014-11-29 ENCOUNTER — Encounter (HOSPITAL_COMMUNITY): Payer: Self-pay

## 2014-11-29 DIAGNOSIS — J90 Pleural effusion, not elsewhere classified: Secondary | ICD-10-CM | POA: Diagnosis present

## 2014-11-29 DIAGNOSIS — I480 Paroxysmal atrial fibrillation: Secondary | ICD-10-CM

## 2014-11-29 DIAGNOSIS — I4891 Unspecified atrial fibrillation: Secondary | ICD-10-CM | POA: Diagnosis present

## 2014-11-29 DIAGNOSIS — Z87891 Personal history of nicotine dependence: Secondary | ICD-10-CM

## 2014-11-29 DIAGNOSIS — Z9221 Personal history of antineoplastic chemotherapy: Secondary | ICD-10-CM | POA: Diagnosis not present

## 2014-11-29 DIAGNOSIS — Z7982 Long term (current) use of aspirin: Secondary | ICD-10-CM | POA: Diagnosis not present

## 2014-11-29 DIAGNOSIS — E038 Other specified hypothyroidism: Secondary | ICD-10-CM | POA: Diagnosis not present

## 2014-11-29 DIAGNOSIS — J910 Malignant pleural effusion: Secondary | ICD-10-CM | POA: Diagnosis present

## 2014-11-29 DIAGNOSIS — Z85118 Personal history of other malignant neoplasm of bronchus and lung: Secondary | ICD-10-CM

## 2014-11-29 DIAGNOSIS — R590 Localized enlarged lymph nodes: Secondary | ICD-10-CM | POA: Diagnosis present

## 2014-11-29 DIAGNOSIS — E032 Hypothyroidism due to medicaments and other exogenous substances: Secondary | ICD-10-CM | POA: Diagnosis present

## 2014-11-29 DIAGNOSIS — C3492 Malignant neoplasm of unspecified part of left bronchus or lung: Secondary | ICD-10-CM | POA: Diagnosis present

## 2014-11-29 DIAGNOSIS — I10 Essential (primary) hypertension: Secondary | ICD-10-CM | POA: Diagnosis present

## 2014-11-29 DIAGNOSIS — T462X1A Poisoning by other antidysrhythmic drugs, accidental (unintentional), initial encounter: Secondary | ICD-10-CM

## 2014-11-29 DIAGNOSIS — Z7901 Long term (current) use of anticoagulants: Secondary | ICD-10-CM

## 2014-11-29 DIAGNOSIS — Z9889 Other specified postprocedural states: Secondary | ICD-10-CM

## 2014-11-29 DIAGNOSIS — C3490 Malignant neoplasm of unspecified part of unspecified bronchus or lung: Secondary | ICD-10-CM | POA: Diagnosis present

## 2014-11-29 HISTORY — DX: Reserved for concepts with insufficient information to code with codable children: IMO0002

## 2014-11-29 HISTORY — DX: Pleural effusion, not elsewhere classified: J90

## 2014-11-29 HISTORY — DX: Hypothyroidism, unspecified: E03.9

## 2014-11-29 LAB — BASIC METABOLIC PANEL
ANION GAP: 14 (ref 5–15)
BUN: 18 mg/dL (ref 6–23)
BUN: 20 mg/dL (ref 6–20)
CALCIUM: 10 mg/dL (ref 8.4–10.5)
CHLORIDE: 96 mmol/L — AB (ref 101–111)
CO2: 25 mEq/L (ref 19–32)
CO2: 23 mmol/L (ref 22–32)
Calcium: 9.6 mg/dL (ref 8.9–10.3)
Chloride: 95 mEq/L — ABNORMAL LOW (ref 96–112)
Creatinine, Ser: 1.47 mg/dL (ref 0.40–1.50)
Creatinine, Ser: 1.46 mg/dL — ABNORMAL HIGH (ref 0.61–1.24)
GFR calc non Af Amer: 46 mL/min — ABNORMAL LOW (ref >60)
GFR, EST AFRICAN AMERICAN: 53 mL/min — AB (ref >60)
GFR: 49.89 mL/min — AB (ref >60.00)
Glucose, Bld: 112 mg/dL — ABNORMAL HIGH (ref 70–99)
Glucose, Bld: 101 mg/dL — ABNORMAL HIGH (ref 65–99)
POTASSIUM: 3.7 meq/L (ref 3.5–5.1)
Potassium: 3.6 mmol/L (ref 3.5–5.1)
SODIUM: 135 meq/L (ref 135–145)
Sodium: 133 mmol/L — ABNORMAL LOW (ref 135–145)

## 2014-11-29 LAB — CBC
HCT: 45.4 % (ref 39.0–52.0)
Hemoglobin: 16 g/dL (ref 13.0–17.0)
MCH: 31.9 pg (ref 26.0–34.0)
MCHC: 35.2 g/dL (ref 30.0–36.0)
MCV: 90.4 fL (ref 78.0–100.0)
PLATELETS: 280 10*3/uL (ref 150–400)
RBC: 5.02 MIL/uL (ref 4.22–5.81)
RDW: 13.4 % (ref 11.5–15.5)
WBC: 12.5 10*3/uL — ABNORMAL HIGH (ref 4.0–10.5)

## 2014-11-29 LAB — I-STAT TROPONIN, ED: TROPONIN I, POC: 0 ng/mL (ref 0.00–0.08)

## 2014-11-29 LAB — TSH: TSH: 16.15 u[IU]/mL — AB (ref 0.35–4.50)

## 2014-11-29 LAB — HEPATIC FUNCTION PANEL
ALK PHOS: 55 U/L (ref 39–117)
ALT: 13 U/L (ref 0–53)
AST: 18 U/L (ref 0–37)
Albumin: 4.4 g/dL (ref 3.5–5.2)
BILIRUBIN DIRECT: 0.1 mg/dL (ref 0.0–0.3)
BILIRUBIN TOTAL: 0.7 mg/dL (ref 0.2–1.2)
Total Protein: 7.7 g/dL (ref 6.0–8.3)

## 2014-11-29 LAB — TROPONIN I: TNIDX: 0 ug/l (ref 0.00–0.06)

## 2014-11-29 LAB — CREATININE KINASE MB: CK MB: 1.9 ng/mL (ref 0.3–4.0)

## 2014-11-29 LAB — D-DIMER, QUANTITATIVE: D-Dimer, Quant: 0.87 ug/mL-FEU — ABNORMAL HIGH (ref 0.00–0.48)

## 2014-11-29 MED ORDER — UMECLIDINIUM-VILANTEROL 62.5-25 MCG/INH IN AEPB: 1.0000 | INHALATION_SPRAY | Freq: Every day | RESPIRATORY_TRACT | Status: DC

## 2014-11-29 MED ORDER — IOHEXOL 350 MG/ML SOLN
80.0000 mL | Freq: Once | INTRAVENOUS | Status: AC | PRN
  Administered 2014-11-29: 100 mL via INTRAVENOUS

## 2014-11-29 MED ORDER — HEPARIN SODIUM (PORCINE) 5000 UNIT/ML IJ SOLN
5000.0000 [IU] | Freq: Three times a day (TID) | INTRAMUSCULAR | Status: DC
  Administered 2014-11-29 – 2014-12-01 (×4): 5000 [IU] via SUBCUTANEOUS
  Filled 2014-11-29 (×5): qty 1

## 2014-11-29 MED ORDER — SODIUM CHLORIDE 0.9 % IJ SOLN: 3.0000 mL | INTRAMUSCULAR | Status: DC | PRN

## 2014-11-29 MED ORDER — TRIAMTERENE-HCTZ 37.5-25 MG PO TABS
1.0000 | ORAL_TABLET | Freq: Every morning | ORAL | Status: DC
  Administered 2014-11-30 – 2014-12-01 (×2): 1 via ORAL
  Filled 2014-11-29 (×2): qty 1

## 2014-11-29 MED ORDER — ACETAMINOPHEN 650 MG RE SUPP: 650.0000 mg | Freq: Four times a day (QID) | RECTAL | Status: DC | PRN

## 2014-11-29 MED ORDER — TOBRAMYCIN-DEXAMETHASONE 0.3-0.1 % OP SUSP
2.0000 [drp] | OPHTHALMIC | Status: DC
  Administered 2014-11-29 – 2014-12-01 (×8): 2 [drp] via OPHTHALMIC
  Filled 2014-11-29: qty 2.5

## 2014-11-29 MED ORDER — METOPROLOL TARTRATE 25 MG PO TABS
25.0000 mg | ORAL_TABLET | Freq: Two times a day (BID) | ORAL | Status: DC
  Administered 2014-11-29 – 2014-12-01 (×4): 25 mg via ORAL
  Filled 2014-11-29 (×4): qty 1

## 2014-11-29 MED ORDER — ASPIRIN EC 81 MG PO TBEC
81.0000 mg | DELAYED_RELEASE_TABLET | Freq: Every day | ORAL | Status: DC
  Administered 2014-12-01: 81 mg via ORAL
  Filled 2014-11-29 (×2): qty 1

## 2014-11-29 MED ORDER — ADULT MULTIVITAMIN W/MINERALS CH
1.0000 | ORAL_TABLET | Freq: Every day | ORAL | Status: DC
  Administered 2014-11-30 – 2014-12-01 (×2): 1 via ORAL
  Filled 2014-11-29 (×2): qty 1

## 2014-11-29 MED ORDER — LEVOTHYROXINE SODIUM 25 MCG PO TABS
25.0000 ug | ORAL_TABLET | Freq: Every day | ORAL | Status: DC
  Administered 2014-11-30 – 2014-12-01 (×2): 25 ug via ORAL
  Filled 2014-11-29 (×2): qty 1

## 2014-11-29 MED ORDER — ALBUTEROL SULFATE (2.5 MG/3ML) 0.083% IN NEBU
2.5000 mg | INHALATION_SOLUTION | RESPIRATORY_TRACT | Status: DC | PRN
  Filled 2014-11-29: qty 3

## 2014-11-29 MED ORDER — ENSURE ENLIVE PO LIQD
237.0000 mL | Freq: Two times a day (BID) | ORAL | Status: DC
  Administered 2014-11-30 – 2014-12-01 (×3): 237 mL via ORAL

## 2014-11-29 MED ORDER — ALBUTEROL SULFATE (2.5 MG/3ML) 0.083% IN NEBU
2.5000 mg | INHALATION_SOLUTION | Freq: Three times a day (TID) | RESPIRATORY_TRACT | Status: DC
  Administered 2014-11-29 – 2014-11-30 (×2): 2.5 mg via RESPIRATORY_TRACT
  Filled 2014-11-29 (×3): qty 3

## 2014-11-29 MED ORDER — ALBUTEROL SULFATE (2.5 MG/3ML) 0.083% IN NEBU
2.5000 mg | INHALATION_SOLUTION | Freq: Four times a day (QID) | RESPIRATORY_TRACT | Status: DC | PRN
  Administered 2014-11-30: 2.5 mg via RESPIRATORY_TRACT
  Filled 2014-11-29: qty 3

## 2014-11-29 MED ORDER — SODIUM CHLORIDE 0.9 % IJ SOLN
3.0000 mL | Freq: Two times a day (BID) | INTRAMUSCULAR | Status: DC
  Administered 2014-11-29 – 2014-12-01 (×4): 3 mL via INTRAVENOUS

## 2014-11-29 MED ORDER — SODIUM CHLORIDE 0.9 % IV SOLN
250.0000 mL | INTRAVENOUS | Status: DC | PRN
Start: 2014-11-29 — End: 2014-12-01

## 2014-11-29 MED ORDER — ACETAMINOPHEN 325 MG PO TABS
650.0000 mg | ORAL_TABLET | Freq: Four times a day (QID) | ORAL | Status: DC | PRN
  Administered 2014-12-01: 650 mg via ORAL
  Filled 2014-11-29: qty 2

## 2014-11-29 MED ORDER — FLUTICASONE PROPIONATE 50 MCG/ACT NA SUSP
2.0000 | Freq: Every day | NASAL | Status: DC
  Administered 2014-12-01: 2 via NASAL
  Filled 2014-11-29: qty 16

## 2014-11-29 MED ORDER — ESCITALOPRAM OXALATE 10 MG PO TABS
10.0000 mg | ORAL_TABLET | Freq: Every day | ORAL | Status: DC
  Administered 2014-11-30 – 2014-12-01 (×2): 10 mg via ORAL
  Filled 2014-11-29 (×2): qty 1

## 2014-11-29 NOTE — H&P (Addendum)
[facility] History and Physical  Stanislav Gervase PPJ:[id number] DOB: 03-09-1940 DOA: 11/29/2014  Referring physician: er PCP: [name], MD   Chief Complaint: SOB  HPI: Brandon Newton is a 74 y.o. male  With PMHX of lung cancer treated by Dr. Mora Appl in January with with family says was complete remission.  He has been getting progressively more SOB over the last 1 week.  No fevers, no chills.  He was seen by his PCP who got a d dimer and sent him to the ER for CTA--- CTA was negative for PE but showed large plueral effusion +6lb weight loss over last few weeks +nausea  In the ER, CTA showed no PE but pleural effusion suspicion for malignancy.  Admitted for thoracentesis.     Review of Systems:  All systems reviewed, negative unless stated above   Past Medical History  Diagnosis Date  . Anxiety   . Hypertension   . Depression   . Cancer ([place])     skin  . Non-small cell carcinoma of left lung, stage 1 ([place])     Thoracoscopic left lower lobectomy August 2015, postoperative chemotherapy   Past Surgical History  Procedure Laterality Date  . Appendectomy    . Video assisted thoracoscopy (vats)/wedge resection Left 09/10/2013    Procedure: LEFT VIDEO ASSISTED THORACOSCOPY ,WEDGE RESECTION LEFT LOWER LOBE LUNG,LEFT LOWER LOBECTOMY, & NODE SAMPLING ;  Surgeon: Melrose Nakayama, MD;  Location: [place];  Service: Thoracic;  Laterality: Left;   Social History:  reports that he quit smoking about 16 years ago. His smoking use included Cigarettes. He has a 16 pack-year smoking history. He has never used smokeless tobacco. He reports that he drinks alcohol. He reports that he does not use illicit drugs.    Family History  Problem Relation Age of Onset  . Lung cancer Father 98    smoked  . Stomach cancer Mother     Prior to Admission medications   Medication Sig Start Date End Date Taking? Authorizing Provider  aspirin EC 81 MG tablet Take 1 tablet (81 mg total)  by mouth daily. 07/14/13  Yes Aleksei Plotnikov V, MD  escitalopram (LEXAPRO) 10 MG tablet Take 1 tablet (10 mg total) by mouth daily. 07/26/14  Yes Aleksei Plotnikov V, MD  fluticasone (FLONASE) 50 MCG/ACT nasal spray Place 2 sprays into both nostrils daily. 12/07/13  Yes Minette Headland, NP  levothyroxine (LEVOTHROID) 25 MCG tablet Take 1 tablet (25 mcg total) by mouth daily. 03/11/14  Yes Aleksei Plotnikov V, MD  metoprolol tartrate (LOPRESSOR) 25 MG tablet Take 1 tablet (25 mg total) by mouth 2 (two) times daily. 03/11/14  Yes Aleksei Plotnikov V, MD  Multiple Vitamin (MULTIVITAMIN) tablet Take 1 tablet by mouth daily.   Yes Historical Provider, MD  Polyethyl Glycol-Propyl Glycol (SYSTANE ULTRA OP) Place 1 drop into both eyes daily as needed (for dry eyes).   Yes Historical Provider, MD  rivaroxaban (XARELTO) 20 MG TABS tablet Take 20 mg by mouth daily with supper.   Yes Historical Provider, MD  tobramycin-dexamethasone [facility]) ophthalmic solution Place 2 drops into both eyes every 4 (four) hours while awake. 12/07/13  Yes Minette Headland, NP  triamterene-hydrochlorothiazide (MAXZIDE-25) 37.5-25 MG per tablet TAKE ONE TABLET BY MOUTH EVERY MORNING 07/26/14  Yes Aleksei Plotnikov V, MD  Umeclidinium-Vilanterol (ANORO ELLIPTA) 62.5-25 MCG/INH AEPB Inhale 1 Act into the lungs daily. 11/28/14  Yes Cassandria Anger, MD   Physical Exam: Filed Vitals:   11/29/14 1323 11/29/14  1326  BP: 158/137   Pulse: 94   TempSrc: Oral   Resp: 22   SpO2: 89% 94%    Wt Readings from Last 3 Encounters:  07/26/14 83.915 kg (185 lb)  03/22/14 85.276 kg (188 lb)  03/17/14 85.458 kg (188 lb 6.4 oz)    General:  Appears calm and comfortable Eyes: PERRL, normal lids, irises & conjunctiva red and inflammed ENT: grossly normal hearing, lips & tongue Neck: no LAD, masses or thyromegaly Cardiovascular: RRR, no m/r/g. No LE edema. Respiratory: right side with dullness and no air movement, no wheezing on left  with good air flow Abdomen: soft, ntnd Skin: no rash or induration seen on limited exam Musculoskeletal: grossly normal tone BUE/BLE Psychiatric: grossly normal mood and affect, speech fluent and appropriate Neurologic: grossly non-focal.          Labs on Admission:  Basic Metabolic Panel:  Recent Labs Lab 11/28/14 1708 11/29/14 1327  NA 135 133*  K 3.7 3.6  CL 95* 96*  CO2 25 23  GLUCOSE 112* 101*  BUN 18 20  CREATININE 1.47 1.46*  CALCIUM 10.0 9.6   Liver Function Tests:  Recent Labs Lab 11/28/14 1708  AST 18  ALT 13  ALKPHOS 55  BILITOT 0.7  PROT 7.7  ALBUMIN 4.4   No results for input(s): LIPASE, AMYLASE in the last 168 hours. No results for input(s): AMMONIA in the last 168 hours. CBC:  Recent Labs Lab 11/28/14 1708 11/29/14 1327  WBC 14.5* 12.5*  NEUTROABS 9.7*  --   HGB 16.5 16.0  HCT 48.7 45.4  MCV 92.2 90.4  PLT 290.0 280   Cardiac Enzymes:  Recent Labs Lab 11/28/14 1708  CKMB 1.9    BNP (last 3 results) No results for input(s): BNP in the last 8760 hours.  ProBNP (last 3 results) No results for input(s): PROBNP in the last 8760 hours.  CBG: No results for input(s): GLUCAP in the last 168 hours.  Radiological Exams on Admission: Dg Chest 2 View  11/29/2014  CLINICAL DATA:  Shortness of breath. EXAM: CHEST  2 VIEW COMPARISON:  11/28/2014.  CT 06/21/2014 and 03/15/2014. FINDINGS: Mediastinum and hilar structures are normal. Bibasilar subsegmental atelectasis and/or scarring again noted. No significant interim change. Small bilateral effusions. Heart size stable. No pneumothorax. No acute osseous abnormality P IMPRESSION: Bibasilar atelectasis and/or infiltrates with small bilateral pleural effusions, right side greater than left. No interim change from prior exam. Electronically Signed   By: Lad[place]   On: 11/29/2014 14:06   Dg Chest 2 View  11/29/2014  CLINICAL DATA:  Shortness of breath for 3 days. History of lung cancer,  hypertension. EXAM: CHEST  2 VIEW COMPARISON:  11/09/2013 FINDINGS: There is a moderate right pleural effusion with right lower lobe atelectasis or infiltrate. Small left pleural effusion with left base atelectasis. Heart is normal size. Mediastinal contours are within normal limits. 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 No acute bony abnormality. IMPRESSION: Bilateral pleural effusions, right greater than left. Minimal left base atelectasis. Right lower lobe atelectasis or consolidation. Electronically Signed   By: Rolm Baptise M.D.   On: 11/29/2014 08:12   Ct Angio Chest Pe W/cm &/or Wo Cm  11/29/2014  CLINICAL DATA:  PT W/ SHOB, MID CP AND ELEVATED D-DIMER H/O left lower LOBECTOMY DUE TO CANCER IN 2015 CONCERN FOR PE EXAM: CT ANGIOGRAPHY CHEST WITH CONTRAST TECHNIQUE: Multidetector CT imaging of the chest was performed using the standard protocol during bolus administration of  intravenous contrast. Multiplanar CT image reconstructions and MIPs were obtained to evaluate the vascular anatomy. CONTRAST:  80 cc OMNIPAQUE IOHEXOL 350 MG/ML SOLN COMPARISON:  06/21/2014 FINDINGS: Heart: There significant coronary artery calcification. No pericardial effusion. Vascular structures: Pulmonary arteries are well opacified. No acute pulmonary embolus. There is tortuosity of the thoracic aorta. Significant atherosclerotic calcification is present. No aneurysm. Mediastinum/thyroid: The visualized portion of the thyroid gland has a normal appearance. Right hilar density raises question of right hilar adenopathy this is difficult to assess given the presence of significant pleural effusion. There are mediastinal lymph nodes which have increased since prior study. AP window node now measures 1.4 cm short axis dimension. Subcarinal lymph node is 1.0 cm short axis dimension. Small calcified lymph node is identified the subcarinal region, stable in appearance. Lungs/Airways: Large right pleural effusion is present.  There is atelectasis of the right upper and lower lobes. There is volume loss the left lung base. No suspicious pulmonary nodules. Upper abdomen: Visualized portion of the pancreas an liver are normal.The adrenal glands are not fully imaged. Chest wall/osseous structures: There is a new superior endplate fracture T9, with approximately 30% loss of anterior height Review of the MIP images confirms the above findings. IMPRESSION: 1. Technically adequate exam.  There is no acute pulmonary embolus. 2. Significant right-sided pleural effusion and right lung atelectasis. Findings raise a question of malignant ascites. 3. Increased mediastinal adenopathy. Question of right hilar adenopathy. 4. Significant coronary artery disease. 5. Interval superior endplate fracture of T9 with 30% loss of anterior height. Electronically Signed   By: Nolon Nations M.D.   On: 11/29/2014 16:49      Assessment/Plan Active Problems:   Essential hypertension   Adenocarcinoma of lung, stage 1 (HCC)   Atrial fibrillation (HCC)   Hypothyroidism due to amiodarone   Pleural effusion   Pleural effusion with h/o adenocarcinoma of lung -treated by Dr. Rogue Jury- added to treatment team -thoracentesis with labs-- may needs echo or abx based on these finding -daughter available for translation- ANNA- # under demographics  A fib -hold xarelto  HTN -continue home meds  leukoctyosis Hold on abx until culture done  Hypothyroid Synthroid   IR consult for thoracentesis in AM  Code Status: full DVT Prophylaxis: Family Communication: daughters at bedside Disposition Plan:   Time spent: 29 min  [facility] Pager [phone]

## 2014-11-29 NOTE — ED Notes (Signed)
Pt reports SOB x 1 week, progressively worsening. Reports intermittent CP. Seen yesterday at PCP and found to have elevated D Dimer. Pt noted to be SOB with speaking.

## 2014-11-29 NOTE — ED Provider Notes (Signed)
CSN: [id number]     Arrival date & time 11/29/14  1250 History   First MD Initiated Contact with Patient 11/29/14 1506     Chief Complaint  Patient presents with  . Shortness of Breath   Patient is a 74 y.o. male presenting with shortness of breath. The history is provided by the patient.  Shortness of Breath Severity:  Moderate Onset quality:  Gradual Duration:  1 week Timing:  Constant Progression:  Worsening Chronicity:  New Relieved by:  Rest Worsened by:  Exertion Associated symptoms: chest pain and cough   Associated symptoms: no fever and no vomiting  Hemoptysis: with eating or moving.   Risk factors: hx of cancer   Pt saw his primary doctor and tests performed.  He started on inhalers but had blood tests including a d dimer.  He was sent to the ED because of the d dimer test.  Past Medical History  Diagnosis Date  . Anxiety   . Hypertension   . Depression   . Cancer ([place])     skin  . Non-small cell carcinoma of left lung, stage 1 ([employer])     Thoracoscopic left lower lobectomy August 2015, postoperative chemotherapy   Past Surgical History  Procedure Laterality Date  . Appendectomy    . Video assisted thoracoscopy (vats)/wedge resection Left 09/10/2013    Procedure: LEFT VIDEO ASSISTED THORACOSCOPY ,WEDGE RESECTION LEFT LOWER LOBE LUNG,LEFT LOWER LOBECTOMY, & NODE SAMPLING ;  Surgeon: Melrose Nakayama, MD;  Location: [place];  Service: Thoracic;  Laterality: Left;   Family History  Problem Relation Age of Onset  . Lung cancer Father 28    smoked  . Stomach cancer Mother    Social History  Substance Use Topics  . Smoking status: Former Smoker -- 0.50 packs/day for 32 years    Types: Cigarettes    Quit date: 01/21/1998  . Smokeless tobacco: Never Used  . Alcohol Use: Yes     Comment: 2-3 drinks/beer a week    Review of Systems  Constitutional: Negative for fever.  Respiratory: Positive for cough and shortness of breath. Hemoptysis: with eating or moving.    Cardiovascular: Positive for chest pain.  Gastrointestinal: Negative for vomiting.       Poor appetite  Musculoskeletal:       Cramping in the arms especially at night       Allergies  Review of patient's allergies indicates not on file.  Home Medications   Prior to Admission medications   Medication Sig Start Date End Date Taking? Authorizing Provider  aspirin EC 81 MG tablet Take 1 tablet (81 mg total) by mouth daily. 07/14/13  Yes Aleksei Plotnikov V, MD  escitalopram (LEXAPRO) 10 MG tablet Take 1 tablet (10 mg total) by mouth daily. 07/26/14  Yes Aleksei Plotnikov V, MD  fluticasone (FLONASE) 50 MCG/ACT nasal spray Place 2 sprays into both nostrils daily. 12/07/13  Yes Minette Headland, NP  levothyroxine (LEVOTHROID) 25 MCG tablet Take 1 tablet (25 mcg total) by mouth daily. 03/11/14  Yes Aleksei Plotnikov V, MD  metoprolol tartrate (LOPRESSOR) 25 MG tablet Take 1 tablet (25 mg total) by mouth 2 (two) times daily. 03/11/14  Yes Aleksei Plotnikov V, MD  Multiple Vitamin (MULTIVITAMIN) tablet Take 1 tablet by mouth daily.   Yes Historical Provider, MD  Polyethyl Glycol-Propyl Glycol (SYSTANE ULTRA OP) Place 1 drop into both eyes daily as needed (for dry eyes).   Yes Historical Provider, MD  rivaroxaban (XARELTO) 20 MG TABS  tablet Take 20 mg by mouth daily with supper.   Yes Historical Provider, MD  tobramycin-dexamethasone [facility]) ophthalmic solution Place 2 drops into both eyes every 4 (four) hours while awake. 12/07/13  Yes Minette Headland, NP  triamterene-hydrochlorothiazide (MAXZIDE-25) 37.5-25 MG per tablet TAKE ONE TABLET BY MOUTH EVERY MORNING 07/26/14  Yes Aleksei Plotnikov V, MD  Umeclidinium-Vilanterol (ANORO ELLIPTA) 62.5-25 MCG/INH AEPB Inhale 1 Act into the lungs daily. 11/28/14  Yes Aleksei Plotnikov V, MD   BP 158/137 mmHg  Pulse 94  Resp 22  SpO2 94% Physical Exam  Constitutional: He appears well-developed and well-nourished. No distress.  HENT:  Head:  Normocephalic and atraumatic.  Right Ear: External ear normal.  Left Ear: External ear normal.  Eyes: Conjunctivae are normal. Right eye exhibits no discharge. Left eye exhibits no discharge. No scleral icterus.  Neck: Neck supple. No tracheal deviation present.  Cardiovascular: Normal rate, regular rhythm and intact distal pulses.   Pulmonary/Chest: Effort normal. No stridor. No respiratory distress. He has no wheezes. He has no rales.  Decreased breath sounds right side  Abdominal: Soft. Bowel sounds are normal. He exhibits no distension. There is no tenderness. There is no rebound and no guarding.  Musculoskeletal: He exhibits no edema or tenderness.  Neurological: He is alert. He has normal strength. No cranial nerve deficit (no facial droop, extraocular movements intact, no slurred speech) or sensory deficit. He exhibits normal muscle tone. He displays no seizure activity. Coordination normal.  Skin: Skin is warm and dry. No rash noted.  Psychiatric: He has a normal mood and affect.  Nursing note and vitals reviewed.   ED Course  Procedures (including critical care time) Labs Review Labs Reviewed  BASIC METABOLIC PANEL - Abnormal; Notable for the following:    Sodium 133 (*)    Chloride 96 (*)    Glucose, Bld 101 (*)    Creatinine, Ser 1.46 (*)    GFR calc non Af Amer 46 (*)    GFR calc Af Amer 53 (*)    All other components within normal limits  CBC - Abnormal; Notable for the following:    WBC 12.5 (*)    All other components within normal limits  I-STAT TROPOININ, ED    Imaging Review Dg Chest 2 View  11/29/2014  CLINICAL DATA:  Shortness of breath. EXAM: CHEST  2 VIEW COMPARISON:  11/28/2014.  CT 06/21/2014 and 03/15/2014. FINDINGS: Mediastinum and hilar structures are normal. Bibasilar subsegmental atelectasis and/or scarring again noted. No significant interim change. Small bilateral effusions. Heart size stable. No pneumothorax. No acute osseous abnormality P  IMPRESSION: Bibasilar atelectasis and/or infiltrates with small bilateral pleural effusions, right side greater than left. No interim change from prior exam. Electronically Signed   By: [place]   On: 11/29/2014 14:06   Dg Chest 2 View  11/29/2014  CLINICAL DATA:  Shortness of breath for 3 days. History of lung cancer, hypertension. EXAM: CHEST  2 VIEW COMPARISON:  11/09/2013 FINDINGS: There is a moderate right pleural effusion with right lower lobe atelectasis or infiltrate. Small left pleural effusion with left base atelectasis. Heart is normal size. Mediastinal contours are within normal limits. 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 No acute bony abnormality. IMPRESSION: Bilateral pleural effusions, right greater than left. Minimal left base atelectasis. Right lower lobe atelectasis or consolidation. Electronically Signed   By: Rolm Baptise M.D.   On: 11/29/2014 08:12   Ct Angio Chest Pe W/cm &/or Wo Cm  11/29/2014  CLINICAL DATA:  PT W/ SHOB, MID CP AND ELEVATED D-DIMER H/O left lower LOBECTOMY DUE TO CANCER IN 39 CONCERN FOR PE EXAM: CT ANGIOGRAPHY CHEST WITH CONTRAST TECHNIQUE: Multidetector CT imaging of the chest was performed using the standard protocol during bolus administration of intravenous contrast. Multiplanar CT image reconstructions and MIPs were obtained to evaluate the vascular anatomy. CONTRAST:  80 cc OMNIPAQUE IOHEXOL 350 MG/ML SOLN COMPARISON:  06/21/2014 FINDINGS: Heart: There significant coronary artery calcification. No pericardial effusion. Vascular structures: Pulmonary arteries are well opacified. No acute pulmonary embolus. There is tortuosity of the thoracic aorta. Significant atherosclerotic calcification is present. No aneurysm. Mediastinum/thyroid: The visualized portion of the thyroid gland has a normal appearance. Right hilar density raises question of right hilar adenopathy this is difficult to assess given the presence of significant pleural  effusion. There are mediastinal lymph nodes which have increased since prior study. AP window node now measures 1.4 cm short axis dimension. Subcarinal lymph node is 1.0 cm short axis dimension. Small calcified lymph node is identified the subcarinal region, stable in appearance. Lungs/Airways: Large right pleural effusion is present. There is atelectasis of the right upper and lower lobes. There is volume loss the left lung base. No suspicious pulmonary nodules. Upper abdomen: Visualized portion of the pancreas an liver are normal.The adrenal glands are not fully imaged. Chest wall/osseous structures: There is a new superior endplate fracture T9, with approximately 30% loss of anterior height Review of the MIP images confirms the above findings. IMPRESSION: 1. Technically adequate exam.  There is no acute pulmonary embolus. 2. Significant right-sided pleural effusion and right lung atelectasis. Findings raise a question of malignant ascites. 3. Increased mediastinal adenopathy. Question of right hilar adenopathy. 4. Significant coronary artery disease. 5. Interval superior endplate fracture of T9 with 30% loss of anterior height. Electronically Signed   By: Nolon Nations M.D.   On: 11/29/2014 16:49   I have personally reviewed and evaluated these images and lab results as part of my medical decision-making.   EKG Interpretation   Date/Time:  Tuesday November 29 2014 13:35:54 EST Ventricular Rate:  97 PR Interval:  142 QRS Duration: 94 QT Interval:  366 QTC Calculation: 464 R Axis:   45 Text Interpretation:  Normal sinus rhythm Normal ECG Since last tracing  rate slower atrial fibrillation is no longer present Confirmed by Hollyanne Schloesser   MD-J, Nataki Mccrumb ([id number]) on 11/29/2014 5:13:58 PM      MDM   Final diagnoses:  Pleural effusion  Mediastinal adenopathy    The patient's CT scan does not show a pulmonary embolism. Unfortunately the patient has an increasing right-sided pleural effusion. He also has  mediastinal adenopathy. Patient does have history of malignancy. These findings are concerning for possible malignant pleural effusion and recurrence of his cancer.  I will consult the hospitalist service for admission and further evaluation, possible diagnostic thoracentesis.    Dorie Rank, MD 11/29/14 [phone]

## 2014-11-29 NOTE — Telephone Encounter (Signed)
Pt was advised to go to ER today by PCP.

## 2014-11-29 NOTE — Progress Notes (Signed)
Pt admitted to the unit at 1837. Pt mental status is alert, verbal, oriented x4. Pt oriented to room, staff, and call bell.  Call bell within reach. Visitor guidelines reviewed w/ pt and/or family.

## 2014-11-29 NOTE — ED Notes (Signed)
Attempted to call report, inpatient RN is unavailable and will return call.

## 2014-11-29 NOTE — Telephone Encounter (Signed)
error 

## 2014-11-30 ENCOUNTER — Inpatient Hospital Stay (HOSPITAL_COMMUNITY): Payer: BLUE CROSS/BLUE SHIELD

## 2014-11-30 ENCOUNTER — Ambulatory Visit: Payer: BLUE CROSS/BLUE SHIELD | Admitting: Internal Medicine

## 2014-11-30 DIAGNOSIS — E038 Other specified hypothyroidism: Secondary | ICD-10-CM

## 2014-11-30 DIAGNOSIS — I10 Essential (primary) hypertension: Secondary | ICD-10-CM

## 2014-11-30 DIAGNOSIS — J90 Pleural effusion, not elsewhere classified: Secondary | ICD-10-CM

## 2014-11-30 DIAGNOSIS — I4891 Unspecified atrial fibrillation: Secondary | ICD-10-CM

## 2014-11-30 LAB — BASIC METABOLIC PANEL
ANION GAP: 12 (ref 5–15)
BUN: 19 mg/dL (ref 6–20)
CALCIUM: 9.4 mg/dL (ref 8.9–10.3)
CHLORIDE: 94 mmol/L — AB (ref 101–111)
CO2: 30 mmol/L (ref 22–32)
CREATININE: 1.56 mg/dL — AB (ref 0.61–1.24)
GFR calc Af Amer: 49 mL/min — ABNORMAL LOW (ref >60)
GFR calc non Af Amer: 42 mL/min — ABNORMAL LOW (ref >60)
GLUCOSE: 87 mg/dL (ref 65–99)
Potassium: 3.5 mmol/L (ref 3.5–5.1)
Sodium: 136 mmol/L (ref 135–145)

## 2014-11-30 LAB — CBC
HCT: 45.3 % (ref 39.0–52.0)
HEMOGLOBIN: 15.5 g/dL (ref 13.0–17.0)
MCH: 31.6 pg (ref 26.0–34.0)
MCHC: 34.2 g/dL (ref 30.0–36.0)
MCV: 92.3 fL (ref 78.0–100.0)
Platelets: 264 10*3/uL (ref 150–400)
RBC: 4.91 MIL/uL (ref 4.22–5.81)
RDW: 13.5 % (ref 11.5–15.5)
WBC: 11.6 10*3/uL — ABNORMAL HIGH (ref 4.0–10.5)

## 2014-11-30 LAB — BODY FLUID CELL COUNT WITH DIFFERENTIAL
EOS FL: 1 %
Lymphs, Fluid: 16 %
MONOCYTE-MACROPHAGE-SEROUS FLUID: 70 % (ref 50–90)
NEUTROPHIL FLUID: 13 % (ref 0–25)
WBC FLUID: 1933 uL — AB (ref 0–1000)

## 2014-11-30 LAB — GRAM STAIN

## 2014-11-30 LAB — LACTATE DEHYDROGENASE, PLEURAL OR PERITONEAL FLUID: LD FL: 999 U/L — AB (ref 3–23)

## 2014-11-30 LAB — PROTEIN, BODY FLUID: Total protein, fluid: 5.1 g/dL

## 2014-11-30 MED ORDER — CETYLPYRIDINIUM CHLORIDE 0.05 % MT LIQD
7.0000 mL | Freq: Two times a day (BID) | OROMUCOSAL | Status: DC
  Administered 2014-11-30 – 2014-12-01 (×3): 7 mL via OROMUCOSAL

## 2014-11-30 MED ORDER — LIDOCAINE HCL (PF) 1 % IJ SOLN
INTRAMUSCULAR | Status: AC
  Filled 2014-11-30: qty 10

## 2014-11-30 NOTE — Care Management Note (Signed)
Case Management Note  Patient Details  Name: Brandon Newton MRN: [id number] Date of Birth: [birth year]  Subjective/Objective:    Date: 11/30/14 Spoke with patient at the bedside along with daughter, Judy Pimple [phone].  Introduced self as Tourist information centre manager and explained role in discharge planning and how to be reached.  Verified patient lives in [place] with spouse . Expressed potential need for no other DME.  Verified patient anticipates to go home with family, at time of discharge and will have full-time  supervision by family  at this time to best of their knowledge. Patient denied needing help with their medication. Patient drives to MD appointments.  Verified patient has PCP Plotnikov.   Plan: CM will continue to follow for discharge planning and [facility] resources.                 Action/Plan:   Expected Discharge Date:                  Expected Discharge Plan:  Home/Self Care  In-House Referral:     Discharge planning Services  CM Consult  Post Acute Care Choice:    Choice offered to:     DME Arranged:    DME Agency:     HH Arranged:    HH Agency:     Status of Service:  In process, will continue to follow  Medicare Important Message Given:    Date Medicare IM Given:    Medicare IM give by:    Date Additional Medicare IM Given:    Additional Medicare Important Message give by:     If discussed at [place] of Stay Meetings, dates discussed:    Additional Comments:  Zenon Mayo, RN 11/30/2014, 3:22 PM

## 2014-11-30 NOTE — Progress Notes (Signed)
[facility] PROGRESS NOTE  Khallid Pasillas PVX:[id number] DOB: [birth year] DOA: 11/29/2014 PCP: [name], MD  Assessment/Plan: Active Problems:  Pleural effusion - Pt is s/p thoracentesis. With extraction of 1.7 liters of fluid - Awaiting analysis results of pleural fluid as well as cytology   Essential hypertension   Adenocarcinoma of lung, stage 1 ([place]) - consulted oncology to assist with case    Atrial fibrillation ([place]) - rate controlled on B blocker - Currently on aspirin    Hypothyroidism due to amiodarone   - continue synthroid regimen.  Code Status: full Family Communication: None at bedside Disposition Plan: Pending work up recommendations by oncology   Consultants:  oncology  Procedures:  thoracentesis  Antibiotics:  None  HPI/Subjective: Pt has no new complaints. Daughter reports that patient is breathing much better currently  Objective: Filed Vitals:   11/30/14 1410  BP: 118/77  Pulse:   Temp:   Resp:     Intake/Output Summary (Last 24 hours) at 11/30/14 1719 Last data filed at 11/30/14 0703  Gross per 24 hour  Intake      3 ml  Output      0 ml  Net      3 ml   Filed Weights   11/29/14 1857  Weight: 83.915 kg (185 lb)    Exam:   General:  Pt in nad, alert and awake  Cardiovascular: s1 and s2 wnl, no rubs  Respiratory: no increased wob, no wheezes, equal chest rise  Abdomen: soft, nd, nt  Musculoskeletal: no cyanosis or clubbing   Data Reviewed: Basic Metabolic Panel:  Recent Labs Lab 11/28/14 1708 11/29/14 1327 11/30/14 0529  NA 135 133* 136  K 3.7 3.6 3.5  CL 95* 96* 94*  CO2 '25 23 30  '$ GLUCOSE 112* 101* 87  BUN '18 20 19  '$ CREATININE 1.47 1.46* 1.56*  CALCIUM 10.0 9.6 9.4   Liver Function Tests:  Recent Labs Lab 11/28/14 1708  AST 18  ALT 13  ALKPHOS 55  BILITOT 0.7  PROT 7.7  ALBUMIN 4.4   No results for input(s): LIPASE, AMYLASE in the last 168 hours. No results for input(s): AMMONIA in  the last 168 hours. CBC:  Recent Labs Lab 11/28/14 1708 11/29/14 1327 11/30/14 0529  WBC 14.5* 12.5* 11.6*  NEUTROABS 9.7*  --   --   HGB 16.5 16.0 15.5  HCT 48.7 45.4 45.3  MCV 92.2 90.4 92.3  PLT 290.0 280 264   Cardiac Enzymes:  Recent Labs Lab 11/28/14 1708  CKMB 1.9   BNP (last 3 results) No results for input(s): BNP in the last 8760 hours.  ProBNP (last 3 results) No results for input(s): PROBNP in the last 8760 hours.  CBG: No results for input(s): GLUCAP in the last 168 hours.  Recent Results (from the past 240 hour(s))  Culture, body fluid-bottle     Status: None (Preliminary result)   Collection Time: 11/30/14  3:17 PM  Result Value Ref Range Status   Specimen Description FLUID RIGHT PLEURAL  Final   Special Requests BOTTLES DRAWN AEROBIC AND ANAEROBIC 10CC  Final   Culture PENDING  Incomplete   Report Status PENDING  Incomplete  Gram stain     Status: None   Collection Time: 11/30/14  3:17 PM  Result Value Ref Range Status   Specimen Description FLUID RIGHT PLEURAL  Final   Special Requests NONE  Final   Gram Stain   Final    FEW WBC PRESENT, PREDOMINANTLY MONONUCLEAR NO  ORGANISMS SEEN    Report Status 11/30/2014 FINAL  Final     Studies: Dg Chest 1 View  11/30/2014  CLINICAL DATA:  Status post right thoracentesis today. Postprocedural examination. EXAM: CHEST 1 VIEW COMPARISON:  CT chest and PA and lateral chest 11/29/2014. FINDINGS: Right pleural effusion is decreased after thoracentesis. No pneumothorax is identified. Very small left pleural effusion is noted. Basilar airspace disease is worse on the right. 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 IMPRESSION: Decreased right pleural effusion after thoracentesis. No pneumothorax or other new abnormality. Electronically Signed   By: Inge Rise M.D.   On: 11/30/2014 14:36   Dg Chest 2 View  11/29/2014  CLINICAL DATA:  Shortness of breath. EXAM: CHEST  2 VIEW COMPARISON:  11/28/2014.   CT 06/21/2014 and 03/15/2014. FINDINGS: Mediastinum and hilar structures are normal. Bibasilar subsegmental atelectasis and/or scarring again noted. No significant interim change. Small bilateral effusions. Heart size stable. No pneumothorax. No acute osseous abnormality P IMPRESSION: Bibasilar atelectasis and/or infiltrates with small bilateral pleural effusions, right side greater than left. No interim change from prior exam. Electronically Signed   By: [place]   On: 11/29/2014 14:06   Ct Angio Chest Pe W/cm &/or Wo Cm  11/29/2014  CLINICAL DATA:  PT W/ SHOB, MID CP AND ELEVATED D-DIMER H/O left lower LOBECTOMY DUE TO CANCER IN 2015 CONCERN FOR PE EXAM: CT ANGIOGRAPHY CHEST WITH CONTRAST TECHNIQUE: Multidetector CT imaging of the chest was performed using the standard protocol during bolus administration of intravenous contrast. Multiplanar CT image reconstructions and MIPs were obtained to evaluate the vascular anatomy. CONTRAST:  80 cc OMNIPAQUE IOHEXOL 350 MG/ML SOLN COMPARISON:  06/21/2014 FINDINGS: Heart: There significant coronary artery calcification. No pericardial effusion. Vascular structures: Pulmonary arteries are well opacified. No acute pulmonary embolus. There is tortuosity of the thoracic aorta. Significant atherosclerotic calcification is present. No aneurysm. Mediastinum/thyroid: The visualized portion of the thyroid gland has a normal appearance. Right hilar density raises question of right hilar adenopathy this is difficult to assess given the presence of significant pleural effusion. There are mediastinal lymph nodes which have increased since prior study. AP window node now measures 1.4 cm short axis dimension. Subcarinal lymph node is 1.0 cm short axis dimension. Small calcified lymph node is identified the subcarinal region, stable in appearance. Lungs/Airways: Large right pleural effusion is present. There is atelectasis of the right upper and lower lobes. There is volume loss  the left lung base. No suspicious pulmonary nodules. Upper abdomen: Visualized portion of the pancreas an liver are normal.The adrenal glands are not fully imaged. Chest wall/osseous structures: There is a new superior endplate fracture T9, with approximately 30% loss of anterior height Review of the MIP images confirms the above findings. IMPRESSION: 1. Technically adequate exam.  There is no acute pulmonary embolus. 2. Significant right-sided pleural effusion and right lung atelectasis. Findings raise a question of malignant ascites. 3. Increased mediastinal adenopathy. Question of right hilar adenopathy. 4. Significant coronary artery disease. 5. Interval superior endplate fracture of T9 with 30% loss of anterior height. Electronically Signed   By: Nolon Nations M.D.   On: 11/29/2014 16:49   US Thoracentesis Asp Pleural Space W/img Guide  11/30/2014  INDICATION: Patient with history of non small cell left lung cancer 2015; now with dyspnea and large right pleural effusion. Request made for diagnostic/therapeutic right thoracentesis. EXAM: ULTRASOUND GUIDED DIAGNOSTIC AND THERAPEUTIC RIGHT THORACENTESIS COMPARISON:  None. MEDICATIONS: None COMPLICATIONS: None immediate TECHNIQUE: Informed written consent  was obtained from the patient after a discussion of the risks, benefits and alternatives to treatment. A timeout was performed prior to the initiation of the procedure. Initial ultrasound scanning demonstrates a large right pleural effusion. The lower chest was prepped and draped in the usual sterile fashion. 1% lidocaine was used for local anesthesia. An ultrasound image was saved for documentation purposes. A 6 Fr Safe-T-Centesis catheter was introduced. The thoracentesis was performed. The catheter was removed and a dressing was applied. The patient tolerated the procedure well without immediate post procedural complication. The patient was escorted to have an upright chest radiograph. FINDINGS: A total  of approximately 1.7 liters of dark, bloody fluid was removed. Requested samples were sent to the laboratory. IMPRESSION: Successful ultrasound-guided diagnostic/therapeutic right sided thoracentesis yielding 1.7 liters of pleural fluid. Read by:  Rowe Robert, [facility] Electronically Signed   By: Sandi Mariscal M.D.   On: 11/30/2014 14:35    Scheduled Meds: . albuterol  2.5 mg Nebulization TID  . antiseptic oral rinse  7 mL Mouth Rinse BID  . aspirin EC  81 mg Oral Daily  . escitalopram  10 mg Oral Daily  . feeding supplement (ENSURE ENLIVE)  237 mL Oral BID BM  . fluticasone  2 spray Each Nare Daily  . heparin  5,000 Units Subcutaneous 3 times per day  . levothyroxine  25 mcg Oral QAC breakfast  . lidocaine (PF)      . metoprolol tartrate  25 mg Oral BID  . multivitamin with minerals  1 tablet Oral Daily  . sodium chloride  3 mL Intravenous Q12H  . tobramycin-dexamethasone  2 drop Both Eyes Q4H while awake  . triamterene-hydrochlorothiazide  1 tablet Oral q morning - 10a  . Umeclidinium-Vilanterol  1 Act Inhalation Daily   Continuous Infusions:   Time spent: > 35 min   Velvet Bathe  [facility] Pager [phone] If 7PM-7AM, please contact night-coverage at [email or web address], password [facility] 11/30/2014, 5:19 PM  LOS: 1 day

## 2014-11-30 NOTE — Procedures (Signed)
US guided diagnostic/therapeutic right thoracentesis performed yielding 1.7 liters dark, bloody fluid. The fluid was sent to the lab for preordered studies. F/u CXR pending. No immediate complications.

## 2014-11-30 NOTE — Progress Notes (Signed)
Initial Nutrition Assessment  DOCUMENTATION CODES:   Not applicable  INTERVENTION:   -RD will follow for diet advancement and supplement as approprate  NUTRITION DIAGNOSIS:   Inadequate oral intake related to inability to eat as evidenced by NPO status.  GOAL:   Patient will meet greater than or equal to 90% of their needs  MONITOR:   Diet advancement, Labs, Weight trends, Skin, I & O's  REASON FOR ASSESSMENT:   Malnutrition Screening Tool    ASSESSMENT:   Brandon Newton is a 74 year old male With PMHX of lung cancer treated by Dr. Mora Appl in January with with family says was complete remission. He has been getting progressively more SOB over the last 1 week. No fevers, no chills. He was seen by his PCP who got a d dimer and sent him to the ER for CTA--- CTA was negative for PE but showed large plueral effusion  Pt admitted with pleural effusion with hx of adenocarcinoma of lung.   Pt is down in radiology for thoracentesis (currently NPO). Reviewed oncology note; CT chest revealed questionable disease recurrence. Pleural fluid will be analyzed by pathology for confirmation of malignancy. Unable to complete Nutrition-Focused physical exam at this time.   Per H&P, pt with 6# weight loss over the past few weeks due to nausea and poor appetite. However, per wt hx, wt has been stable over the past year. UBW 183#.   Labs reviewed.   Diet Order:  NPO  Skin:  Reviewed, no issues  Last BM:  11/29/14  Height:   Ht Readings from Last 1 Encounters:  11/29/14 '5\' 6"'$  (1.676 m)    Weight:   Wt Readings from Last 1 Encounters:  11/29/14 185 lb (83.915 kg)    Ideal Body Weight:  64.5 kg  BMI:  Body mass index is 29.87 kg/(m^2).  Estimated Nutritional Needs:   Kcal:  1700-1900  Protein:  85-100 grams  Fluid:  1.7-1.9 L  EDUCATION NEEDS:   No education needs identified at this time  Deauna Yaw A. Jimmye Norman, RD, LDN, CDE Pager: [phone] After hours Pager:  [phone]

## 2014-11-30 NOTE — Progress Notes (Signed)
CHART NOTE I was made familiar with the patient's admission. I reviewed the recent CT scan of the chest which showed significantly larger right pleural effusion with questionable disease recurrence in the mediastinum. I strongly recommend for the patient to see a cardiothoracic surgery during his hospitalization for consideration of Pleurx catheter drainage of the right pleural effusion. We will need to send the pleural fluid for cytologic evaluation for confirmation of recurrent malignancy. I will arrange for the patient a follow-up appointment with me at the [facility] after discharge from the hospital for more detailed discussion of his treatment options. Thank you so much for taking good care of Brandon Newton. Please call if you have any questions.

## 2014-12-01 ENCOUNTER — Inpatient Hospital Stay (HOSPITAL_COMMUNITY): Payer: BLUE CROSS/BLUE SHIELD

## 2014-12-01 DIAGNOSIS — J90 Pleural effusion, not elsewhere classified: Secondary | ICD-10-CM

## 2014-12-01 LAB — PH, BODY FLUID: pH, Body Fluid: 7.2

## 2014-12-01 MED ORDER — ARFORMOTEROL TARTRATE 15 MCG/2ML IN NEBU
15.0000 ug | INHALATION_SOLUTION | Freq: Two times a day (BID) | RESPIRATORY_TRACT | Status: DC
  Administered 2014-12-01: 15 ug via RESPIRATORY_TRACT
  Filled 2014-12-01: qty 2

## 2014-12-01 MED ORDER — IPRATROPIUM-ALBUTEROL 0.5-2.5 (3) MG/3ML IN SOLN
3.0000 mL | Freq: Three times a day (TID) | RESPIRATORY_TRACT | Status: DC
  Administered 2014-12-01: 3 mL via RESPIRATORY_TRACT
  Filled 2014-12-01: qty 3

## 2014-12-01 MED ORDER — IPRATROPIUM BROMIDE 0.02 % IN SOLN: 0.5000 mg | Freq: Four times a day (QID) | RESPIRATORY_TRACT | Status: DC

## 2014-12-01 NOTE — Consult Note (Signed)
Reason for Consult:Right pleural effusion Referring Physician: Dr. Ardeth Perfect Cullen is an 74 y.o. male.  HPI: 74 yo man who is a patient of mine.  I did a thoracoscopic left lower lobectomy on him in August 2015 for a stage IB adenocarcinoma. I last saw him in July 2016. He was doing well at that time. He does not speak English but his family says that he became short of breath about 2 weeks ago and it accelerated rapidly over the week PTA.   He was found to have a large right effusion on CXR. A CT showed a large right effusion and some mild mediastinal adenopathy. A thoracentesis was done yesterday. 1.7 L of fluid was drained. He improved symptomatically post drainage. Cytology is pending.  He denies change in appetite and weight loss. He has had a cough. + orthopnea. Denies fever, chills and night sweats.  Past Medical History  Diagnosis Date  . Anxiety   . Hypertension   . Depression   . Squamous carcinoma (HCC)     "face & scale"  . 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 Hypothyroidism   . Pleural effusion     /notes 11/29/2014    Past Surgical History  Procedure Laterality Date  . Appendectomy    . Video assisted thoracoscopy (vats)/wedge resection Left 09/10/2013    Procedure: LEFT VIDEO ASSISTED THORACOSCOPY ,WEDGE RESECTION LEFT LOWER LOBE LUNG,LEFT LOWER LOBECTOMY, & NODE SAMPLING ;  Surgeon: Melrose Nakayama, MD;  Location: [facility];  Service: Thoracic;  Laterality: Left;    Family History  Problem Relation Age of Onset  . Lung cancer Father 67    smoked  . Stomach cancer Mother     Social History:  reports that he quit smoking about 16 years ago. His smoking use included Cigarettes. He has a 16 pack-year smoking history. He has never used smokeless tobacco. He reports that he drinks about 1.8 oz of alcohol per week. He reports that he does not use illicit drugs.  Allergies: No Known  Allergies  Medications:  Scheduled: . antiseptic oral rinse  7 mL Mouth Rinse BID  . arformoterol  15 mcg Nebulization BID  . aspirin EC  81 mg Oral Daily  . escitalopram  10 mg Oral Daily  . feeding supplement (ENSURE ENLIVE)  237 mL Oral BID BM  . fluticasone  2 spray Each Nare Daily  . heparin  5,000 Units Subcutaneous 3 times per day  . ipratropium-albuterol  3 mL Nebulization TID  . levothyroxine  25 mcg Oral QAC breakfast  . metoprolol tartrate  25 mg Oral BID  . multivitamin with minerals  1 tablet Oral Daily  . sodium chloride  3 mL Intravenous Q12H  . tobramycin-dexamethasone  2 drop Both Eyes Q4H while awake  . triamterene-hydrochlorothiazide  1 tablet Oral q morning - 10a    Results for orders placed or performed during the hospital encounter of 11/29/14 (from the past 48 hour(s))  Basic metabolic panel     Status: Abnormal   Collection Time: 11/30/14  5:29 AM  Result Value Ref Range   Sodium 136 135 - 145 mmol/L   Potassium 3.5 3.5 - 5.1 mmol/L   Chloride 94 (L) 101 - 111 mmol/L   CO2 30 22 - 32 mmol/L   Glucose, Bld 87 65 - 99 mg/dL   BUN 19 6 - 20 mg/dL   Creatinine, Ser 1.56 (H) 0.61 -  1.24 mg/dL   Calcium 9.4 8.9 - 10.3 mg/dL   GFR calc non Af Amer 42 (L) >60 mL/min   GFR calc Af Amer 49 (L) >60 mL/min    Comment: (NOTE) The eGFR has been calculated using the CKD EPI equation. This calculation has not been validated in all clinical situations. eGFR's persistently <60 mL/min signify possible Chronic Kidney Disease.    Anion gap 12 5 - 15  CBC     Status: Abnormal   Collection Time: 11/30/14  5:29 AM  Result Value Ref Range   WBC 11.6 (H) 4.0 - 10.5 K/uL   RBC 4.91 4.22 - 5.81 MIL/uL   Hemoglobin 15.5 13.0 - 17.0 g/dL   HCT 45.3 39.0 - 52.0 %   MCV 92.3 78.0 - 100.0 fL   MCH 31.6 26.0 - 34.0 pg   MCHC 34.2 30.0 - 36.0 g/dL   RDW 13.5 11.5 - 15.5 %   Platelets 264 150 - 400 K/uL  Lactate dehydrogenase (CSF, pleural or peritoneal fluid)     Status:  Abnormal   Collection Time: 11/30/14  3:15 PM  Result Value Ref Range   LD, Fluid 999 (H) 3 - 23 U/L    Comment: (NOTE) Results should be evaluated in conjunction with serum values    Fluid Type-FLDH FLUID     Comment: PLEURAL CORRECTED ON 11/09 AT 1516: PREVIOUSLY REPORTED AS Pleural R   Body fluid cell count with differential     Status: Abnormal   Collection Time: 11/30/14  3:15 PM  Result Value Ref Range   Fluid Type-FCT FLUID     Comment: PLEURAL CORRECTED ON 11/09 AT 1516: PREVIOUSLY REPORTED AS Pleural R    Color, Fluid RED (A) YELLOW   Appearance, Fluid TURBID (A) CLEAR   WBC, Fluid 1933 (H) 0 - 1000 cu mm   Neutrophil Count, Fluid 13 0 - 25 %   Lymphs, Fluid 16 %   Monocyte-Macrophage-Serous Fluid 70 50 - 90 %   Eos, Fluid 1 %   Other Cells, Fluid DEGENERATED CELLS PRESENT %  Protein, pleural or peritoneal fluid     Status: None   Collection Time: 11/30/14  3:16 PM  Result Value Ref Range   Total protein, fluid 5.1 g/dL    Comment: (NOTE) No normal range established for this test Results should be evaluated in conjunction with serum values    Fluid Type-FTP FLUID     Comment: PLEURAL CORRECTED ON 11/09 AT 1516: PREVIOUSLY REPORTED AS Pleural R   Culture, body fluid-bottle     Status: None (Preliminary result)   Collection Time: 11/30/14  3:17 PM  Result Value Ref Range   Specimen Description FLUID RIGHT PLEURAL    Special Requests BOTTLES DRAWN AEROBIC AND ANAEROBIC 10CC    Culture NO GROWTH < 24 HOURS    Report Status PENDING   Gram stain     Status: None   Collection Time: 11/30/14  3:17 PM  Result Value Ref Range   Specimen Description FLUID RIGHT PLEURAL    Special Requests NONE    Gram Stain      FEW WBC PRESENT, PREDOMINANTLY MONONUCLEAR NO ORGANISMS SEEN    Report Status 11/30/2014 FINAL     Dg Chest 1 View  11/30/2014  CLINICAL DATA:  Status post right thoracentesis today. Postprocedural examination. EXAM: CHEST 1 VIEW COMPARISON:  CT chest  and PA and lateral chest 11/29/2014. FINDINGS: Right pleural effusion is decreased after thoracentesis. No pneumothorax is identified. Very  small left pleural effusion is noted. Basilar airspace disease is worse on the right. 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 IMPRESSION: Decreased right pleural effusion after thoracentesis. No pneumothorax or other new abnormality. Electronically Signed   By: Inge Rise M.D.   On: 11/30/2014 14:36   Dg Chest 2 View  12/01/2014  CLINICAL DATA:  Effusions, cough. EXAM: CHEST  2 VIEW COMPARISON:  11/30/2014 and 11/29/2014 FINDINGS: Lungs are somewhat hypoinflated with stable small to moderate right pleural effusion likely with associated atelectasis in the right base. Possible small amount left pleural fluid unchanged. Linear atelectasis left midlung. Cardiomediastinal silhouette and remainder of the exam is unchanged. IMPRESSION: Stable small to moderate right pleural effusion likely with associated basilar atelectasis. Possible small amount left pleural fluid. Electronically Signed   By: Marin Olp M.D.   On: 12/01/2014 13:00   Ct Angio Chest Pe W/cm &/or Wo Cm  11/29/2014  CLINICAL DATA:  PT W/ SHOB, MID CP AND ELEVATED D-DIMER H/O left lower LOBECTOMY DUE TO CANCER IN 2015 CONCERN FOR PE EXAM: CT ANGIOGRAPHY CHEST WITH CONTRAST TECHNIQUE: Multidetector CT imaging of the chest was performed using the standard protocol during bolus administration of intravenous contrast. Multiplanar CT image reconstructions and MIPs were obtained to evaluate the vascular anatomy. CONTRAST:  80 cc OMNIPAQUE IOHEXOL 350 MG/ML SOLN COMPARISON:  06/21/2014 FINDINGS: Heart: There significant coronary artery calcification. No pericardial effusion. Vascular structures: Pulmonary arteries are well opacified. No acute pulmonary embolus. There is tortuosity of the thoracic aorta. Significant atherosclerotic calcification is present. No aneurysm. Mediastinum/thyroid: The  visualized portion of the thyroid gland has a normal appearance. Right hilar density raises question of right hilar adenopathy this is difficult to assess given the presence of significant pleural effusion. There are mediastinal lymph nodes which have increased since prior study. AP window node now measures 1.4 cm short axis dimension. Subcarinal lymph node is 1.0 cm short axis dimension. Small calcified lymph node is identified the subcarinal region, stable in appearance. Lungs/Airways: Large right pleural effusion is present. There is atelectasis of the right upper and lower lobes. There is volume loss the left lung base. No suspicious pulmonary nodules. Upper abdomen: Visualized portion of the pancreas an liver are normal.The adrenal glands are not fully imaged. Chest wall/osseous structures: There is a new superior endplate fracture T9, with approximately 30% loss of anterior height Review of the MIP images confirms the above findings. IMPRESSION: 1. Technically adequate exam.  There is no acute pulmonary embolus. 2. Significant right-sided pleural effusion and right lung atelectasis. Findings raise a question of malignant ascites. 3. Increased mediastinal adenopathy. Question of right hilar adenopathy. 4. Significant coronary artery disease. 5. Interval superior endplate fracture of T9 with 30% loss of anterior height. Electronically Signed   By: Nolon Nations M.D.   On: 11/29/2014 16:49   US Thoracentesis Asp Pleural Space W/img Guide  11/30/2014  INDICATION: Patient with history of non small cell left lung cancer 2015; now with dyspnea and large right pleural effusion. Request made for diagnostic/therapeutic right thoracentesis. EXAM: ULTRASOUND GUIDED DIAGNOSTIC AND THERAPEUTIC RIGHT THORACENTESIS COMPARISON:  None. MEDICATIONS: None COMPLICATIONS: None immediate TECHNIQUE: Informed written consent was obtained from the patient after a discussion of the risks, benefits and alternatives to treatment. A  timeout was performed prior to the initiation of the procedure. Initial ultrasound scanning demonstrates a large right pleural effusion. The lower chest was prepped and draped in the usual sterile fashion. 1% lidocaine was used for local anesthesia.  An ultrasound image was saved for documentation purposes. A 6 Fr Safe-T-Centesis catheter was introduced. The thoracentesis was performed. The catheter was removed and a dressing was applied. The patient tolerated the procedure well without immediate post procedural complication. The patient was escorted to have an upright chest radiograph. FINDINGS: A total of approximately 1.7 liters of dark, bloody fluid was removed. Requested samples were sent to the laboratory. IMPRESSION: Successful ultrasound-guided diagnostic/therapeutic right sided thoracentesis yielding 1.7 liters of pleural fluid. Read by:  Rowe Robert, [facility] Electronically Signed   By: Sandi Mariscal M.D.   On: 11/30/2014 14:35    Review of Systems  Constitutional: Negative for fever, chills and weight loss.  Respiratory: Positive for shortness of breath. Negative for cough and sputum production.   Cardiovascular: Positive for orthopnea.   Blood pressure 132/70, pulse 92, temperature 98.9 F (37.2 C), temperature source Oral, resp. rate 18, height 5' 6"  (1.676 m), weight 185 lb (83.915 kg), SpO2 96 %. Physical Exam  Vitals reviewed. Constitutional: He is oriented to person, place, and time. He appears well-developed and well-nourished. No distress.  HENT:  Head: Normocephalic and atraumatic.  Mouth/Throat: No oropharyngeal exudate.  Eyes: Conjunctivae and EOM are normal. No scleral icterus.  Cardiovascular: Normal rate, regular rhythm and normal heart sounds.   Respiratory:  Diminished BS bibasilar R>L  GI: Soft. There is no tenderness.  Musculoskeletal: He exhibits no edema.  Lymphadenopathy:    He has no cervical adenopathy.  Neurological: He is oriented to person, place, and time. No  cranial nerve deficit. He exhibits normal muscle tone.  Skin: Skin is warm and dry.    Assessment/Plan: 74 yo man with a history of stage IB adenocarcinoma of the lung, treated last year with left lower lobectomy followed by adjuvant chemotherapy. He now presents with shortness of breath and has a large right pleural effusion. The obvious concern in his case is recurrent cancer. Other considerations are a second primary and infectious or inflammatory causes. The fluid was an exudate, so not CHF.  The first step is thoracentesis which has been done. We are still awaiting cytology results.  The next step will largely depend on the cytology results. If negative, repeat thoracentesis and/ or bronch/ EBUS would be indicated. If + and effusion recurs consider pleural catheter.  He is symptomatically improved and I think he would be fine to go home and complete his work up as an outpatient.  I can see him back in the office next week  Melrose Nakayama 12/01/2014, 2:14 PM

## 2014-12-01 NOTE — Progress Notes (Signed)
Pt being discharged home via wheelchair with family. Pt alert and oriented x4. VSS. Pt c/o no pain at this time. No signs of respiratory distress. Education complete and care plans resolved. IV removed with catheter intact and pt tolerated well. No further issues at this time. Pt to follow up with PCP. Mohd. Derflinger R, RN 

## 2014-12-01 NOTE — Discharge Summary (Signed)
Physician Discharge Summary  Brandon Newton NFA:[id number] DOB: 05-30-1940 DOA: 11/29/2014  PCP: [name], MD  Admit date: 11/29/2014 Discharge date: 12/01/2014  Time spent: > 35 minutes  Recommendations for Outpatient Follow-up:  1. Patient will need to follow up with pleural fluid cytology 2. Also follow up with oncologist and cardiothoracic surgeon  Discharge Diagnoses:  Active Problems:   Essential hypertension   Adenocarcinoma of lung, stage 1 (HCC)   Atrial fibrillation (HCC)   Hypothyroidism due to amiodarone   Pleural effusion   Discharge Condition: stable  Diet recommendation: Carb modified diet  Filed Weights   11/29/14 1857  Weight: 83.915 kg (185 lb)    History of present illness:  Pt is a 74 y/o with history of lung cancer that was in remission reportedly but presented with sob and CT of chest reporting pleural effusion suspicious for malignancy related.  Hospital Course:  Pleural effusion - Pt is s/p thoracentesis. With extraction of 1.7 liters of fluid -  pleural fluid as well as cytology obtained and awaiting results. Oncologist to follow.  Essential hypertension  Adenocarcinoma of lung, stage 1 (HCC) - consulted oncology who will see patient as outpatient.   Atrial fibrillation (HCC) - rate controlled on B blocker - Continue NOAC (d/c asa given that patient is on NOAC)   Hypothyroidism due to amiodarone  - continue synthroid regimen.  Procedures:  thoracentesis  Consultations:  Oncology  Cardiothoracic surgeon  Discharge Exam: Filed Vitals:   12/01/14 0600  BP: 132/70  Pulse: 92  Temp: 98.9 F (37.2 C)  Resp: 18    General: Pt in nad, alert and awake Cardiovascular: rrr, no mrg Respiratory: cta bl, no wheezes  Discharge Instructions   Discharge Instructions    Call MD for:  difficulty breathing, headache or visual disturbances    Complete by:  As directed      Call MD for:  temperature >100.4    Complete by:   As directed      Diet - low sodium heart healthy    Complete by:  As directed      Discharge instructions    Complete by:  As directed   Please f/u with your cardiothoracic surgeon in 1 week as per their recommendations.   Also follow up with oncologist within the next 1 week.     Increase activity slowly    Complete by:  As directed           Current Discharge Medication List    CONTINUE these medications which have NOT CHANGED   Details  escitalopram (LEXAPRO) 10 MG tablet Take 1 tablet (10 mg total) by mouth daily. Qty: 30 tablet, Refills: 5    fluticasone (FLONASE) 50 MCG/ACT nasal spray Place 2 sprays into both nostrils daily. Qty: 16 g, Refills: 2   Associated Diagnoses: Nasal drainage    levothyroxine (LEVOTHROID) 25 MCG tablet Take 1 tablet (25 mcg total) by mouth daily. Qty: 30 tablet, Refills: 11    metoprolol tartrate (LOPRESSOR) 25 MG tablet Take 1 tablet (25 mg total) by mouth 2 (two) times daily. Qty: 60 tablet, Refills: 11    Multiple Vitamin (MULTIVITAMIN) tablet Take 1 tablet by mouth daily.   Associated Diagnoses: Adenocarcinoma of lung, stage 1, left (HCC)    Polyethyl Glycol-Propyl Glycol (SYSTANE ULTRA OP) Place 1 drop into both eyes daily as needed (for dry eyes).    rivaroxaban (XARELTO) 20 MG TABS tablet Take 20 mg by mouth daily with supper.  tobramycin-dexamethasone (TOBRADEX) ophthalmic solution Place 2 drops into both eyes every 4 (four) hours while awake. Qty: 5 mL, Refills: 0   Associated Diagnoses: Eye irritation    triamterene-hydrochlorothiazide (MAXZIDE-25) 37.5-25 MG per tablet TAKE ONE TABLET BY MOUTH EVERY MORNING Qty: 30 tablet, Refills: 5    Umeclidinium-Vilanterol (ANORO ELLIPTA) 62.5-25 MCG/INH AEPB Inhale 1 Act into the lungs daily. Qty: 1 each, Refills: 11      STOP taking these medications     aspirin EC 81 MG tablet        No Known Allergies    The results of significant diagnostics from this hospitalization  (including imaging, microbiology, ancillary and laboratory) are listed below for reference.    Significant Diagnostic Studies: Dg Chest 1 View  11/30/2014  CLINICAL DATA:  Status post right thoracentesis today. Postprocedural examination. EXAM: CHEST 1 VIEW COMPARISON:  CT chest and PA and lateral chest 11/29/2014. FINDINGS: Right pleural effusion is decreased after thoracentesis. No pneumothorax is identified. Very small left pleural effusion is noted. Basilar airspace disease is worse on the right. 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 IMPRESSION: Decreased right pleural effusion after thoracentesis. No pneumothorax or other new abnormality. Electronically Signed   By: Inge Rise M.D.   On: 11/30/2014 14:36   Dg Chest 2 View  12/01/2014  CLINICAL DATA:  Effusions, cough. EXAM: CHEST  2 VIEW COMPARISON:  11/30/2014 and 11/29/2014 FINDINGS: Lungs are somewhat hypoinflated with stable small to moderate right pleural effusion likely with associated atelectasis in the right base. Possible small amount left pleural fluid unchanged. Linear atelectasis left midlung. Cardiomediastinal silhouette and remainder of the exam is unchanged. IMPRESSION: Stable small to moderate right pleural effusion likely with associated basilar atelectasis. Possible small amount left pleural fluid. Electronically Signed   By: Marin Olp M.D.   On: 12/01/2014 13:00   Dg Chest 2 View  11/29/2014  CLINICAL DATA:  Shortness of breath. EXAM: CHEST  2 VIEW COMPARISON:  11/28/2014.  CT 06/21/2014 and 03/15/2014. FINDINGS: Mediastinum and hilar structures are normal. Bibasilar subsegmental atelectasis and/or scarring again noted. No significant interim change. Small bilateral effusions. Heart size stable. No pneumothorax. No acute osseous abnormality P IMPRESSION: Bibasilar atelectasis and/or infiltrates with small bilateral pleural effusions, right side greater than left. No interim change from prior exam.  Electronically Signed   By: [place]   On: 11/29/2014 14:06   Dg Chest 2 View  11/29/2014  CLINICAL DATA:  Shortness of breath for 3 days. History of lung cancer, hypertension. EXAM: CHEST  2 VIEW COMPARISON:  11/09/2013 FINDINGS: There is a moderate right pleural effusion with right lower lobe atelectasis or infiltrate. Small left pleural effusion with left base atelectasis. Heart is normal size. Mediastinal contours are within normal limits. 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 No acute bony abnormality. IMPRESSION: Bilateral pleural effusions, right greater than left. Minimal left base atelectasis. Right lower lobe atelectasis or consolidation. Electronically Signed   By: Rolm Baptise M.D.   On: 11/29/2014 08:12   Ct Angio Chest Pe W/cm &/or Wo Cm  11/29/2014  CLINICAL DATA:  PT W/ SHOB, MID CP AND ELEVATED D-DIMER H/O left lower LOBECTOMY DUE TO CANCER IN 2015 CONCERN FOR PE EXAM: CT ANGIOGRAPHY CHEST WITH CONTRAST TECHNIQUE: Multidetector CT imaging of the chest was performed using the standard protocol during bolus administration of intravenous contrast. Multiplanar CT image reconstructions and MIPs were obtained to evaluate the vascular anatomy. CONTRAST:  80 cc OMNIPAQUE IOHEXOL 350 MG/ML  SOLN COMPARISON:  06/21/2014 FINDINGS: Heart: There significant coronary artery calcification. No pericardial effusion. Vascular structures: Pulmonary arteries are well opacified. No acute pulmonary embolus. There is tortuosity of the thoracic aorta. Significant atherosclerotic calcification is present. No aneurysm. Mediastinum/thyroid: The visualized portion of the thyroid gland has a normal appearance. Right hilar density raises question of right hilar adenopathy this is difficult to assess given the presence of significant pleural effusion. There are mediastinal lymph nodes which have increased since prior study. AP window node now measures 1.4 cm short axis dimension. Subcarinal lymph node is  1.0 cm short axis dimension. Small calcified lymph node is identified the subcarinal region, stable in appearance. Lungs/Airways: Large right pleural effusion is present. There is atelectasis of the right upper and lower lobes. There is volume loss the left lung base. No suspicious pulmonary nodules. Upper abdomen: Visualized portion of the pancreas an liver are normal.The adrenal glands are not fully imaged. Chest wall/osseous structures: There is a new superior endplate fracture T9, with approximately 30% loss of anterior height Review of the MIP images confirms the above findings. IMPRESSION: 1. Technically adequate exam.  There is no acute pulmonary embolus. 2. Significant right-sided pleural effusion and right lung atelectasis. Findings raise a question of malignant ascites. 3. Increased mediastinal adenopathy. Question of right hilar adenopathy. 4. Significant coronary artery disease. 5. Interval superior endplate fracture of T9 with 30% loss of anterior height. Electronically Signed   By: Nolon Nations M.D.   On: 11/29/2014 16:49   US Thoracentesis Asp Pleural Space W/img Guide  11/30/2014  INDICATION: Patient with history of non small cell left lung cancer 2015; now with dyspnea and large right pleural effusion. Request made for diagnostic/therapeutic right thoracentesis. EXAM: ULTRASOUND GUIDED DIAGNOSTIC AND THERAPEUTIC RIGHT THORACENTESIS COMPARISON:  None. MEDICATIONS: None COMPLICATIONS: None immediate TECHNIQUE: Informed written consent was obtained from the patient after a discussion of the risks, benefits and alternatives to treatment. A timeout was performed prior to the initiation of the procedure. Initial ultrasound scanning demonstrates a large right pleural effusion. The lower chest was prepped and draped in the usual sterile fashion. 1% lidocaine was used for local anesthesia. An ultrasound image was saved for documentation purposes. A 6 Fr Safe-T-Centesis catheter was introduced. The  thoracentesis was performed. The catheter was removed and a dressing was applied. The patient tolerated the procedure well without immediate post procedural complication. The patient was escorted to have an upright chest radiograph. FINDINGS: A total of approximately 1.7 liters of dark, bloody fluid was removed. Requested samples were sent to the laboratory. IMPRESSION: Successful ultrasound-guided diagnostic/therapeutic right sided thoracentesis yielding 1.7 liters of pleural fluid. Read by:  Rowe Robert, [facility] Electronically Signed   By: Sandi Mariscal M.D.   On: 11/30/2014 14:35    Microbiology: Recent Results (from the past 240 hour(s))  Culture, body fluid-bottle     Status: None (Preliminary result)   Collection Time: 11/30/14  3:17 PM  Result Value Ref Range Status   Specimen Description FLUID RIGHT PLEURAL  Final   Special Requests BOTTLES DRAWN AEROBIC AND ANAEROBIC 10CC  Final   Culture NO GROWTH < 24 HOURS  Final   Report Status PENDING  Incomplete  Gram stain     Status: None   Collection Time: 11/30/14  3:17 PM  Result Value Ref Range Status   Specimen Description FLUID RIGHT PLEURAL  Final   Special Requests NONE  Final   Gram Stain   Final    FEW WBC PRESENT,  PREDOMINANTLY MONONUCLEAR NO ORGANISMS SEEN    Report Status 11/30/2014 FINAL  Final     Labs: Basic Metabolic Panel:  Recent Labs Lab 11/28/14 1708 11/29/14 1327 11/30/14 0529  NA 135 133* 136  K 3.7 3.6 3.5  CL 95* 96* 94*  CO2 '25 23 30  '$ GLUCOSE 112* 101* 87  BUN '18 20 19  '$ CREATININE 1.47 1.46* 1.56*  CALCIUM 10.0 9.6 9.4   Liver Function Tests:  Recent Labs Lab 11/28/14 1708  AST 18  ALT 13  ALKPHOS 55  BILITOT 0.7  PROT 7.7  ALBUMIN 4.4   No results for input(s): LIPASE, AMYLASE in the last 168 hours. No results for input(s): AMMONIA in the last 168 hours. CBC:  Recent Labs Lab 11/28/14 1708 11/29/14 1327 11/30/14 0529  WBC 14.5* 12.5* 11.6*  NEUTROABS 9.7*  --   --   HGB 16.5 16.0  15.5  HCT 48.7 45.4 45.3  MCV 92.2 90.4 92.3  PLT 290.0 280 264   Cardiac Enzymes:  Recent Labs Lab 11/28/14 1708  CKMB 1.9   BNP: BNP (last 3 results) No results for input(s): BNP in the last 8760 hours.  ProBNP (last 3 results) No results for input(s): PROBNP in the last 8760 hours.  CBG: No results for input(s): GLUCAP in the last 168 hours.   Signed:  Velvet Bathe  [facility] 12/01/2014, 2:58 PM

## 2014-12-02 ENCOUNTER — Telehealth: Payer: Self-pay | Admitting: *Deleted

## 2014-12-02 NOTE — Telephone Encounter (Signed)
Pt was on TCM list d/c 11/10, admitted for Pleural fluid cytology ? Lung cancer retrun. Will be f/u with Surgeon & Oncologist in 1 week...Johny Chess

## 2014-12-04 ENCOUNTER — Telehealth: Payer: Self-pay | Admitting: Internal Medicine

## 2014-12-04 DIAGNOSIS — C3490 Malignant neoplasm of unspecified part of unspecified bronchus or lung: Secondary | ICD-10-CM

## 2014-12-04 DIAGNOSIS — R0902 Hypoxemia: Secondary | ICD-10-CM

## 2014-12-04 DIAGNOSIS — J90 Pleural effusion, not elsewhere classified: Secondary | ICD-10-CM

## 2014-12-04 NOTE — Telephone Encounter (Signed)
Low O2 Needs O2: O2 sats at home 83-88% - ref to [facility] in touch w/thor surgery To ER if worse

## 2014-12-05 ENCOUNTER — Encounter: Payer: Self-pay | Admitting: Thoracic Surgery (Cardiothoracic Vascular Surgery)

## 2014-12-05 ENCOUNTER — Ambulatory Visit (INDEPENDENT_AMBULATORY_CARE_PROVIDER_SITE_OTHER): Payer: BLUE CROSS/BLUE SHIELD | Admitting: Thoracic Surgery (Cardiothoracic Vascular Surgery)

## 2014-12-05 ENCOUNTER — Other Ambulatory Visit: Payer: Self-pay | Admitting: Thoracic Surgery (Cardiothoracic Vascular Surgery)

## 2014-12-05 ENCOUNTER — Telehealth: Payer: Self-pay | Admitting: Internal Medicine

## 2014-12-05 ENCOUNTER — Other Ambulatory Visit: Payer: Self-pay | Admitting: *Deleted

## 2014-12-05 ENCOUNTER — Ambulatory Visit
Admission: RE | Admit: 2014-12-05 | Discharge: 2014-12-05 | Disposition: A | Discharge status: Home / Self Care | Payer: BLUE CROSS/BLUE SHIELD | Source: Ambulatory Visit | Attending: Thoracic Surgery (Cardiothoracic Vascular Surgery) | Admitting: Thoracic Surgery (Cardiothoracic Vascular Surgery)

## 2014-12-05 VITALS — BP 93/67 | HR 92 | Resp 18 | Ht 67.0 in | Wt 178.6 lb

## 2014-12-05 DIAGNOSIS — Z9889 Other specified postprocedural states: Secondary | ICD-10-CM

## 2014-12-05 DIAGNOSIS — J90 Pleural effusion, not elsewhere classified: Secondary | ICD-10-CM

## 2014-12-05 DIAGNOSIS — J948 Other specified pleural conditions: Secondary | ICD-10-CM | POA: Diagnosis not present

## 2014-12-05 DIAGNOSIS — Z902 Acquired absence of lung [part of]: Secondary | ICD-10-CM | POA: Diagnosis not present

## 2014-12-05 DIAGNOSIS — C3432 Malignant neoplasm of lower lobe, left bronchus or lung: Secondary | ICD-10-CM

## 2014-12-05 LAB — CULTURE, BODY FLUID W GRAM STAIN -BOTTLE: Culture: NO GROWTH

## 2014-12-05 NOTE — Telephone Encounter (Signed)
s.w. pt dtr and sched sooner appt...ok adn aware of d.t

## 2014-12-05 NOTE — Progress Notes (Signed)
[address]       [place] [address]             [phone]       HPI:  Mr. Brandon Newton returns re: right pleural effusion.  He is a 74 yo former smoker who had a thoracoscopic left lower lobectomy in August 2015 for a stage IB adenocarcinoma. He was treated with adjuvant chemotherapy. He presented to the hospital last week with a right pleural effusion. He had a thoracentesis on Thursday draining 1.7 L of bloody fluid. He improved symptomatically and was emphatic about going home when I saw him on Saturday. By Sunday he was getting progressively more short of breath.   Cytology on the fluid was positive for adenocarcinoma.  His daughter and a professional interpreter are present to translate. Past Medical History  Diagnosis Date  . Anxiety   . Hypertension   . Depression   . Squamous carcinoma (HCC)     "face & scale"  . Non-small cell carcinoma of left lung, stage 1 ([place]) 08/2013    postoperative chemotherapy  . Hypothyroidism   . Pleural effusion     /notes 11/29/2014      Current Outpatient Prescriptions  Medication Sig Dispense Refill  . escitalopram (LEXAPRO) 10 MG tablet Take 1 tablet (10 mg total) by mouth daily. 30 tablet 5  . fluticasone (FLONASE) 50 MCG/ACT nasal spray Place 2 sprays into both nostrils daily. 16 g 2  . levothyroxine (LEVOTHROID) 25 MCG tablet Take 1 tablet (25 mcg total) by mouth daily. 30 tablet 11  . metoprolol tartrate (LOPRESSOR) 25 MG tablet Take 1 tablet (25 mg total) by mouth 2 (two) times daily. 60 tablet 11  . Multiple Vitamin (MULTIVITAMIN) tablet Take 1 tablet by mouth daily.    Brandon Newton Glycol-Propyl Glycol (SYSTANE ULTRA OP) Place 1 drop into both eyes daily as needed (for dry eyes).    Marland Kitchen tobramycin-dexamethasone (TOBRADEX) ophthalmic solution Place 2 drops into both eyes every 4 (four) hours while awake. 5 mL 0  . triamterene-hydrochlorothiazide (MAXZIDE-25) 37.5-25 MG per tablet TAKE ONE TABLET BY MOUTH EVERY  MORNING 30 tablet 5  . Umeclidinium-Vilanterol (ANORO ELLIPTA) 62.5-25 MCG/INH AEPB Inhale 1 Act into the lungs daily. 1 each 11   No current facility-administered medications for this visit.    Physical Exam BP 93/67 mmHg  Pulse 92  Resp 18  Ht '5\' 7"'$  (1.702 m)  Wt 178 lb 9.2 oz (81 kg)  BMI 27.96 kg/m2  SpO2 96% 74 yo man in NAD Alert and oriented' Diminished BS with dullness to percussion right base  Diagnostic Tests: CXR shows a large right effusion Cytology + adenocarcinoma  Impression: 74 yo man with stage IB adenocarcinoma a year ago treated with lobectomy and chemo, now presents with a rapidly recurring malignant right pleural effusion.  He needs a thoracentesis today for immediate relief but also needs a long term solution.  I recommended we proceed with thoracentesis today to remove some of the fluid and then place a pleural catheter on Wednesday for long term management. I informed him of the risks of thoracentesis- bleeding, pneumo, and the risks of pleural catheter- bleeding, pneumo, catheter malposition or occlusion, infection. He accepts the risks and agrees to proceed.   PROCEDURE: After informed consent, right thoracentesis performed using sterile technique and 5 ml of 1% lidocaine for local. 1.5 L of bloody fluid evacuated. Tolerated well  Plan: Repeat PA/Lat CXR post thoracentesis  Pleural  catheter placement Wednesday 11/16  Melrose Nakayama, MD [facility] [phone]

## 2014-12-06 ENCOUNTER — Other Ambulatory Visit (HOSPITAL_COMMUNITY)
Admission: RE | Admit: 2014-12-06 | Discharge: 2014-12-06 | Disposition: A | Discharge status: Home / Self Care | Payer: BLUE CROSS/BLUE SHIELD | Source: Ambulatory Visit | Attending: Internal Medicine | Admitting: Internal Medicine

## 2014-12-06 ENCOUNTER — Ambulatory Visit: Payer: BLUE CROSS/BLUE SHIELD | Admitting: Thoracic Surgery (Cardiothoracic Vascular Surgery)

## 2014-12-06 ENCOUNTER — Encounter (HOSPITAL_COMMUNITY): Payer: Self-pay | Admitting: *Deleted

## 2014-12-06 DIAGNOSIS — C3492 Malignant neoplasm of unspecified part of left bronchus or lung: Secondary | ICD-10-CM | POA: Insufficient documentation

## 2014-12-06 NOTE — Progress Notes (Addendum)
Patient's daughter, POA, Dr. [name] took pre- op call for patient.  I asked her to bring in Advance Directive papers in.

## 2014-12-07 ENCOUNTER — Ambulatory Visit (HOSPITAL_COMMUNITY): Payer: BLUE CROSS/BLUE SHIELD | Admitting: Certified Registered"

## 2014-12-07 ENCOUNTER — Ambulatory Visit (HOSPITAL_COMMUNITY)
Admission: RE | Admit: 2014-12-07 | Discharge: 2014-12-07 | Disposition: A | Discharge status: Home / Self Care | Payer: BLUE CROSS/BLUE SHIELD | Source: Ambulatory Visit | Attending: Thoracic Surgery (Cardiothoracic Vascular Surgery) | Admitting: Thoracic Surgery (Cardiothoracic Vascular Surgery)

## 2014-12-07 ENCOUNTER — Ambulatory Visit (HOSPITAL_COMMUNITY): Payer: BLUE CROSS/BLUE SHIELD

## 2014-12-07 ENCOUNTER — Encounter (HOSPITAL_COMMUNITY)
Admission: RE | Disposition: A | Discharge status: Home / Self Care | Payer: Self-pay | Source: Ambulatory Visit | Attending: Thoracic Surgery (Cardiothoracic Vascular Surgery)

## 2014-12-07 ENCOUNTER — Encounter (HOSPITAL_COMMUNITY): Payer: Self-pay | Admitting: Certified Registered"

## 2014-12-07 DIAGNOSIS — I10 Essential (primary) hypertension: Secondary | ICD-10-CM | POA: Diagnosis not present

## 2014-12-07 DIAGNOSIS — J449 Chronic obstructive pulmonary disease, unspecified: Secondary | ICD-10-CM | POA: Insufficient documentation

## 2014-12-07 DIAGNOSIS — Z85118 Personal history of other malignant neoplasm of bronchus and lung: Secondary | ICD-10-CM | POA: Diagnosis not present

## 2014-12-07 DIAGNOSIS — J910 Malignant pleural effusion: Secondary | ICD-10-CM | POA: Insufficient documentation

## 2014-12-07 DIAGNOSIS — J90 Pleural effusion, not elsewhere classified: Secondary | ICD-10-CM | POA: Diagnosis not present

## 2014-12-07 DIAGNOSIS — Z9221 Personal history of antineoplastic chemotherapy: Secondary | ICD-10-CM | POA: Insufficient documentation

## 2014-12-07 DIAGNOSIS — Z79899 Other long term (current) drug therapy: Secondary | ICD-10-CM | POA: Insufficient documentation

## 2014-12-07 DIAGNOSIS — Z87891 Personal history of nicotine dependence: Secondary | ICD-10-CM | POA: Insufficient documentation

## 2014-12-07 DIAGNOSIS — E039 Hypothyroidism, unspecified: Secondary | ICD-10-CM | POA: Diagnosis not present

## 2014-12-07 HISTORY — DX: Cardiac arrhythmia, unspecified: I49.9

## 2014-12-07 HISTORY — PX: CHEST TUBE INSERTION: SHX231

## 2014-12-07 LAB — SURGICAL PCR SCREEN
MRSA, PCR: NEGATIVE
STAPHYLOCOCCUS AUREUS: NEGATIVE

## 2014-12-07 LAB — PROTIME-INR
INR: 1.08 (ref 0.00–1.49)
Prothrombin Time: 14.2 seconds (ref 11.6–15.2)

## 2014-12-07 LAB — APTT: aPTT: 31 seconds (ref 24–37)

## 2014-12-07 SURGERY — INSERTION, PLEURAL DRAINAGE CATHETER
Anesthesia: Monitor Anesthesia Care | Laterality: Right

## 2014-12-07 MED ORDER — GLYCOPYRROLATE 0.2 MG/ML IJ SOLN
INTRAMUSCULAR | Status: AC
  Filled 2014-12-07: qty 2

## 2014-12-07 MED ORDER — ACETAMINOPHEN 325 MG PO TABS: 325.0000 mg | ORAL_TABLET | ORAL | Status: DC | PRN

## 2014-12-07 MED ORDER — OXYCODONE HCL 5 MG/5ML PO SOLN: 5.0000 mg | Freq: Once | ORAL | Status: DC | PRN

## 2014-12-07 MED ORDER — MIDAZOLAM HCL 5 MG/5ML IJ SOLN
INTRAMUSCULAR | Status: DC | PRN
  Administered 2014-12-07: 2 mg via INTRAVENOUS

## 2014-12-07 MED ORDER — NEOSTIGMINE METHYLSULFATE 10 MG/10ML IV SOLN
INTRAVENOUS | Status: AC
  Filled 2014-12-07: qty 6

## 2014-12-07 MED ORDER — MIDAZOLAM HCL 2 MG/2ML IJ SOLN
INTRAMUSCULAR | Status: AC
  Filled 2014-12-07: qty 2

## 2014-12-07 MED ORDER — FENTANYL CITRATE (PF) 250 MCG/5ML IJ SOLN
INTRAMUSCULAR | Status: DC | PRN
  Administered 2014-12-07: 50 ug via INTRAVENOUS

## 2014-12-07 MED ORDER — DEXTROSE 5 % IV SOLN
1.5000 g | INTRAVENOUS | Status: AC
  Administered 2014-12-07: 1.5 g via INTRAVENOUS

## 2014-12-07 MED ORDER — OXYCODONE HCL 5 MG PO TABS: 5.0000 mg | ORAL_TABLET | Freq: Four times a day (QID) | ORAL | Status: DC | PRN

## 2014-12-07 MED ORDER — LIDOCAINE HCL (PF) 1 % IJ SOLN
INTRAMUSCULAR | Status: DC | PRN
  Administered 2014-12-07: 5 mL

## 2014-12-07 MED ORDER — LACTATED RINGERS IV SOLN
INTRAVENOUS | Status: DC
  Administered 2014-12-07: 11:00:00 via INTRAVENOUS

## 2014-12-07 MED ORDER — FENTANYL CITRATE (PF) 100 MCG/2ML IJ SOLN: 25.0000 ug | INTRAMUSCULAR | Status: DC | PRN

## 2014-12-07 MED ORDER — PROPOFOL 10 MG/ML IV BOLUS
INTRAVENOUS | Status: AC
  Filled 2014-12-07: qty 20

## 2014-12-07 MED ORDER — DEXTROSE 5 % IV SOLN
INTRAVENOUS | Status: AC
  Filled 2014-12-07: qty 1.5

## 2014-12-07 MED ORDER — MUPIROCIN 2 % EX OINT
1.0000 "application " | TOPICAL_OINTMENT | Freq: Once | CUTANEOUS | Status: AC
  Administered 2014-12-07: 1 via TOPICAL

## 2014-12-07 MED ORDER — ACETAMINOPHEN 160 MG/5ML PO SOLN
325.0000 mg | ORAL | Status: DC | PRN
  Filled 2014-12-07: qty 20.3

## 2014-12-07 MED ORDER — FENTANYL CITRATE (PF) 250 MCG/5ML IJ SOLN
INTRAMUSCULAR | Status: AC
  Filled 2014-12-07: qty 5

## 2014-12-07 MED ORDER — 0.9 % SODIUM CHLORIDE (POUR BTL) OPTIME
TOPICAL | Status: DC | PRN
  Administered 2014-12-07: 1000 mL

## 2014-12-07 MED ORDER — MUPIROCIN 2 % EX OINT
TOPICAL_OINTMENT | CUTANEOUS | Status: AC
  Filled 2014-12-07: qty 22

## 2014-12-07 MED ORDER — OXYCODONE HCL 5 MG PO TABS: 5.0000 mg | ORAL_TABLET | Freq: Once | ORAL | Status: DC | PRN

## 2014-12-07 MED ORDER — ONDANSETRON HCL 4 MG/2ML IJ SOLN
INTRAMUSCULAR | Status: AC
  Filled 2014-12-07: qty 2

## 2014-12-07 MED ORDER — LIDOCAINE HCL (CARDIAC) 20 MG/ML IV SOLN
INTRAVENOUS | Status: AC
  Filled 2014-12-07: qty 5

## 2014-12-07 MED ORDER — PROPOFOL 500 MG/50ML IV EMUL
INTRAVENOUS | Status: DC | PRN
  Administered 2014-12-07: 50 ug/kg/min via INTRAVENOUS

## 2014-12-07 SURGICAL SUPPLY — 31 items
CANISTER SUCTION 2500CC (MISCELLANEOUS) ×3 IMPLANT
COVER SURGICAL LIGHT HANDLE (MISCELLANEOUS) ×3 IMPLANT
DERMABOND ADVANCED (GAUZE/BANDAGES/DRESSINGS) ×2
DERMABOND ADVANCED .7 DNX12 (GAUZE/BANDAGES/DRESSINGS) ×1 IMPLANT
DRAPE C-ARM 42X72 X-RAY (DRAPES) ×3 IMPLANT
DRAPE LAPAROSCOPIC ABDOMINAL (DRAPES) ×3 IMPLANT
GLOVE BIO SURGEON STRL SZ 6.5 (GLOVE) ×4 IMPLANT
GLOVE BIO SURGEONS STRL SZ 6.5 (GLOVE) ×2
GLOVE BIOGEL PI IND STRL 6.5 (GLOVE) ×1 IMPLANT
GLOVE BIOGEL PI INDICATOR 6.5 (GLOVE) ×2
GLOVE SURG SIGNA 7.5 PF LTX (GLOVE) ×3 IMPLANT
GOWN STRL REUS W/ TWL LRG LVL3 (GOWN DISPOSABLE) ×1 IMPLANT
GOWN STRL REUS W/ TWL XL LVL3 (GOWN DISPOSABLE) ×1 IMPLANT
GOWN STRL REUS W/TWL LRG LVL3 (GOWN DISPOSABLE) ×2
GOWN STRL REUS W/TWL XL LVL3 (GOWN DISPOSABLE) ×2
KIT BASIN OR (CUSTOM PROCEDURE TRAY) ×3 IMPLANT
KIT PLEURX DRAIN CATH 1000ML (MISCELLANEOUS) ×6 IMPLANT
KIT PLEURX DRAIN CATH 15.5FR (DRAIN) ×3 IMPLANT
KIT ROOM TURNOVER OR (KITS) ×3 IMPLANT
NS IRRIG 1000ML POUR BTL (IV SOLUTION) ×3 IMPLANT
PACK GENERAL/GYN (CUSTOM PROCEDURE TRAY) ×3 IMPLANT
PAD ARMBOARD 7.5X6 YLW CONV (MISCELLANEOUS) ×6 IMPLANT
SET DRAINAGE LINE (MISCELLANEOUS) IMPLANT
SPONGE GAUZE 4X4 12PLY STER LF (GAUZE/BANDAGES/DRESSINGS) ×3 IMPLANT
SUT ETHILON 3 0 FSL (SUTURE) ×3 IMPLANT
SUT VIC AB 3-0 X1 27 (SUTURE) ×3 IMPLANT
TAPE CLOTH SURG 4X10 WHT LF (GAUZE/BANDAGES/DRESSINGS) ×3 IMPLANT
TOWEL OR 17X24 6PK STRL BLUE (TOWEL DISPOSABLE) ×3 IMPLANT
TOWEL OR 17X26 10 PK STRL BLUE (TOWEL DISPOSABLE) ×3 IMPLANT
VALVE REPLACEMENT CAP (MISCELLANEOUS) IMPLANT
WATER STERILE IRR 1000ML POUR (IV SOLUTION) ×3 IMPLANT

## 2014-12-07 NOTE — Anesthesia Preprocedure Evaluation (Signed)
Anesthesia Evaluation  Patient identified by MRN, date of birth, ID band Patient awake    Reviewed: Allergy & Precautions, NPO status , Patient's Chart, lab work & pertinent test results  History of Anesthesia Complications Negative for: history of anesthetic complications  Airway Mallampati: II  TM Distance: >3 FB Neck ROM: Full    Dental  (+) Teeth Intact   Pulmonary shortness of breath, COPD, former smoker,     + decreased breath sounds      Cardiovascular hypertension, Pt. on medications and Pt. on home beta blockers + Peripheral Vascular Disease  + dysrhythmias  Rhythm:Regular     Neuro/Psych PSYCHIATRIC DISORDERS Anxiety Depression negative neurological ROS     GI/Hepatic negative GI ROS, Neg liver ROS,   Endo/Other  Hypothyroidism   Renal/GU Renal InsufficiencyRenal disease     Musculoskeletal   Abdominal   Peds  Hematology   Anesthesia Other Findings   Reproductive/Obstetrics                             Anesthesia Physical Anesthesia Plan  ASA: III  Anesthesia Plan: MAC   Post-op Pain Management:    Induction: Intravenous  Airway Management Planned: Natural Airway, Simple Face Mask and Nasal Cannula  Additional Equipment: None  Intra-op Plan:   Post-operative Plan:   Informed Consent: I have reviewed the patients History and Physical, chart, labs and discussed the procedure including the risks, benefits and alternatives for the proposed anesthesia with the patient or authorized representative who has indicated his/her understanding and acceptance.   Dental advisory given  Plan Discussed with: CRNA and Surgeon  Anesthesia Plan Comments:         Anesthesia Quick Evaluation

## 2014-12-07 NOTE — Anesthesia Postprocedure Evaluation (Signed)
  Anesthesia Post-op Note  Patient: Brandon Newton  Procedure(s) Performed: Procedure(s): INSERTION PLEURAL DRAINAGE CATHETER (Right)  Patient Location: PACU  Anesthesia Type:MAC  Level of Consciousness: awake  Airway and Oxygen Therapy: Patient Spontanous Breathing  Post-op Pain: none  Post-op Assessment: Post-op Vital signs reviewed, Patient's Cardiovascular Status Stable, Respiratory Function Stable, Patent Airway, No signs of Nausea or vomiting and Pain level controlled              Post-op Vital Signs: Reviewed and stable  Last Vitals:  Filed Vitals:   12/07/14 1501  BP:   Pulse:   Temp: 36.6 C  Resp:     Complications: No apparent anesthesia complications

## 2014-12-07 NOTE — Care Management Note (Signed)
Case Management Note  Patient Details  Name: Brandon Newton MRN: [id number] Date of Birth: 12/09/1940  Subjective/Objective:  74 y.o. [place] speaking M, seen in the PACU along with interpreter to arrange [facility] for Daily PleurX catheter Management. Adult Daughter says she will be available with pt for 1 week or so to assist with care. Pt and daughter would like [facility] to provide care. CM spoke with Miranda, [facility] rep and made her aware of pt needs. PleurX info faxed/Mailed to [facility]. [phone]) They can also be reached by phone at [phone].                   Action/Plan:Discharge home with supplies.    Expected Discharge Date:                  Expected Discharge Plan:     In-House Referral:     Discharge planning Services  CM Consult  Post Acute Care Choice:    Choice offered to:  Patient, Adult Children [name]: [phone] (adult Daughter))  DME Arranged:  Chest tube pluerex (PleurX) DME Agency:  [place]:  RN [facility] Agency:  [place]  Status of Service:  Completed, signed off  Medicare Important Message Given:    Date Medicare IM Given:    Medicare IM give by:    Date Additional Medicare IM Given:    Additional Medicare Important Message give by:     If discussed at [place] of Stay Meetings, dates discussed:    Additional Comments:  Delrae Sawyers, RN 12/07/2014, 3:00 PM

## 2014-12-07 NOTE — H&P (View-Only) (Signed)
[address]       [place] [address]             [phone]       HPI:  Mr. Rossbach returns re: right pleural effusion.  He is a 74 yo former smoker who had a thoracoscopic left lower lobectomy in August 2015 for a stage IB adenocarcinoma. He was treated with adjuvant chemotherapy. He presented to the hospital last week with a right pleural effusion. He had a thoracentesis on Thursday draining 1.7 L of bloody fluid. He improved symptomatically and was emphatic about going home when I saw him on Saturday. By Sunday he was getting progressively more short of breath.   Cytology on the fluid was positive for adenocarcinoma.  His daughter and a professional interpreter are present to translate. Past Medical History  Diagnosis Date  . Anxiety   . Hypertension   . Depression   . Squamous carcinoma (HCC)     "face & scale"  . Non-small cell carcinoma of left lung, stage 1 ([place]) 08/2013    postoperative chemotherapy  . Hypothyroidism   . Pleural effusion     /notes 11/29/2014      Current Outpatient Prescriptions  Medication Sig Dispense Refill  . escitalopram (LEXAPRO) 10 MG tablet Take 1 tablet (10 mg total) by mouth daily. 30 tablet 5  . fluticasone (FLONASE) 50 MCG/ACT nasal spray Place 2 sprays into both nostrils daily. 16 g 2  . levothyroxine (LEVOTHROID) 25 MCG tablet Take 1 tablet (25 mcg total) by mouth daily. 30 tablet 11  . metoprolol tartrate (LOPRESSOR) 25 MG tablet Take 1 tablet (25 mg total) by mouth 2 (two) times daily. 60 tablet 11  . Multiple Vitamin (MULTIVITAMIN) tablet Take 1 tablet by mouth daily.    Vladimir Faster Glycol-Propyl Glycol (SYSTANE ULTRA OP) Place 1 drop into both eyes daily as needed (for dry eyes).    Marland Kitchen tobramycin-dexamethasone (TOBRADEX) ophthalmic solution Place 2 drops into both eyes every 4 (four) hours while awake. 5 mL 0  . triamterene-hydrochlorothiazide (MAXZIDE-25) 37.5-25 MG per tablet TAKE ONE TABLET BY MOUTH EVERY  MORNING 30 tablet 5  . Umeclidinium-Vilanterol (ANORO ELLIPTA) 62.5-25 MCG/INH AEPB Inhale 1 Act into the lungs daily. 1 each 11   No current facility-administered medications for this visit.    Physical Exam BP 93/67 mmHg  Pulse 92  Resp 18  Ht '5\' 7"'$  (1.702 m)  Wt 178 lb 9.2 oz (81 kg)  BMI 27.96 kg/m2  SpO2 96% 74 yo man in NAD Alert and oriented' Diminished BS with dullness to percussion right base  Diagnostic Tests: CXR shows a large right effusion Cytology + adenocarcinoma  Impression: 74 yo man with stage IB adenocarcinoma a year ago treated with lobectomy and chemo, now presents with a rapidly recurring malignant right pleural effusion.  He needs a thoracentesis today for immediate relief but also needs a long term solution.  I recommended we proceed with thoracentesis today to remove some of the fluid and then place a pleural catheter on Wednesday for long term management. I informed him of the risks of thoracentesis- bleeding, pneumo, and the risks of pleural catheter- bleeding, pneumo, catheter malposition or occlusion, infection. He accepts the risks and agrees to proceed.   PROCEDURE: After informed consent, right thoracentesis performed using sterile technique and 5 ml of 1% lidocaine for local. 1.5 L of bloody fluid evacuated. Tolerated well  Plan: Repeat PA/Lat CXR post thoracentesis  Pleural  catheter placement Wednesday 11/16  Melrose Nakayama, MD [facility] [phone]

## 2014-12-07 NOTE — Brief Op Note (Signed)
12/07/2014  2:05 PM  PATIENT:  Brandon Newton  74 y.o. male  PRE-OPERATIVE DIAGNOSIS:  MALIGNANT RIGHT PLEURAL EFFUSION  POST-OPERATIVE DIAGNOSIS:  MALIGNANT RIGHT PLEURAL EFFUSION  PROCEDURE:  Procedure(s): INSERTION PLEURAL DRAINAGE CATHETER (Right)  SURGEON:  Surgeon(s) and Role:    * Melrose Nakayama, MD - Primary  ANESTHESIA:   local  EBL:  Total I/O In: 200 [I.V.:200] Out: -   BLOOD ADMINISTERED:none  DRAINS: pleural catheter on right   LOCAL MEDICATIONS USED:  LIDOCAINE  and Amount: 11 ml  SPECIMEN:  Aspirate  DISPOSITION OF SPECIMEN:  N/A  COUNTS:  YES  PLAN OF CARE: Discharge to home after PACU  PATIENT DISPOSITION:  PACU - hemodynamically stable.   Delay start of Pharmacological VTE agent (>24hrs) due to surgical blood loss or risk of bleeding: not applicable  1.8 L bloody fluid drained. Tolerated well.

## 2014-12-07 NOTE — Interval H&P Note (Signed)
History and Physical Interval Note:  12/07/2014 11:56 AM  Brandon Newton  has presented today for surgery, with the diagnosis of MALIGNANT RIGHT PLEURAL EFFUSION  The various methods of treatment have been discussed with the patient and family. After consideration of risks, benefits and other options for treatment, the patient has consented to  Procedure(s): INSERTION PLEURAL DRAINAGE CATHETER (Right) as a surgical intervention .  The patient's history has been reviewed, patient examined, no change in status, stable for surgery.  I have reviewed the patient's chart and labs.  Questions were answered to the patient's satisfaction.     Melrose Nakayama

## 2014-12-07 NOTE — Discharge Instructions (Signed)
Do not drive or engage in heavy physical activity for 24 hours.  You may shower tomorrow. Leave dressing on over catheter when you shower.  A home health nurse will be arranged to assist with catheter management.  You have a prescription for oxycodone, a narcotic pain reliever, you may use as directed for pain.  You may use acetaminophen (Tylenol) in addition to or instead of the oxycodone.  Call [phone] if you develop chest pain, shortness of breath, fever > 101F or soak the bandage with blood.  My office will contact you with follow up information.

## 2014-12-07 NOTE — Transfer of Care (Signed)
Immediate Anesthesia Transfer of Care Note  Patient: Brandon Newton  Procedure(s) Performed: Procedure(s): INSERTION PLEURAL DRAINAGE CATHETER (Right)  Patient Location: PACU  Anesthesia Type:MAC  Level of Consciousness: awake, alert , oriented and patient cooperative  Airway & Oxygen Therapy: Patient Spontanous Breathing and Patient connected to face mask oxygen  Post-op Assessment: Report given to RN, Post -op Vital signs reviewed and stable and Patient moving all extremities  Post vital signs: Reviewed and stable  Last Vitals:  Filed Vitals:   12/07/14 1130  BP: 120/75  Pulse: 78  Temp: 36.8 C  Resp: 18    Complications: No apparent anesthesia complications

## 2014-12-08 ENCOUNTER — Encounter (HOSPITAL_COMMUNITY): Payer: Self-pay

## 2014-12-08 ENCOUNTER — Encounter (HOSPITAL_COMMUNITY): Payer: Self-pay | Admitting: Thoracic Surgery (Cardiothoracic Vascular Surgery)

## 2014-12-08 ENCOUNTER — Telehealth: Payer: Self-pay | Admitting: Internal Medicine

## 2014-12-08 ENCOUNTER — Ambulatory Visit (HOSPITAL_BASED_OUTPATIENT_CLINIC_OR_DEPARTMENT_OTHER): Payer: BLUE CROSS/BLUE SHIELD | Admitting: Internal Medicine

## 2014-12-08 VITALS — BP 97/60 | HR 91 | Temp 97.7°F | Resp 19 | Ht 67.0 in | Wt 176.3 lb

## 2014-12-08 DIAGNOSIS — Z4803 Encounter for change or removal of drains: Secondary | ICD-10-CM | POA: Diagnosis not present

## 2014-12-08 DIAGNOSIS — C3491 Malignant neoplasm of unspecified part of right bronchus or lung: Secondary | ICD-10-CM

## 2014-12-08 DIAGNOSIS — J910 Malignant pleural effusion: Secondary | ICD-10-CM | POA: Diagnosis not present

## 2014-12-08 NOTE — Op Note (Signed)
NAME:  Brandon Newton, [name]            ACCOUNT NO.:  [id number]  MEDICAL RECORD NO.:  [id number]  LOCATION:  [facility]                         FACILITY:  [facility]  PHYSICIAN:  Revonda Standard. Roxan Hockey, M.D.DATE OF BIRTH:  [birth year]  DATE OF PROCEDURE:  12/07/2014 DATE OF DISCHARGE:  12/07/2014                              OPERATIVE REPORT   PREOPERATIVE DIAGNOSIS:  Malignant right pleural effusion.  POSTOPERATIVE DIAGNOSIS:  Malignant right pleural effusion.  PROCEDURE:  Right PleurX catheter placement.  SURGEON:  Revonda Standard. Roxan Hockey, M.D.  ASSISTANT:  None.  ANESTHESIA:  Local with intravenous sedation.  FINDINGS:  Catheter in good position, 1.8 L of fluid drained.  The patient tolerated well.  CLINICAL NOTE:  [name] is a 75 year old gentleman who has had lobectomy followed by chemotherapy for stage IB adenocarcinoma.  He now presents with a contralateral recurrence with a malignant right pleural effusion.  This has been rapidly recurring.  He was advised to undergo pleural catheter placement for management of the effusion.  The indications, risks, benefits, and alternatives were discussed in detail with the patient.  He understood and accepted the risks and agreed to proceed.  DESCRIPTION OF PROCEDURE:  Mr. Seres was brought to the operating room on December 07, 2014.  He was given intravenous sedation and monitored by the Anesthesia Service.  The right chest was prepped and draped in the usual sterile fashion.  1% lidocaine was used to achieve local anesthesia.  A total of 11 mL was used.  An incision was made in the lateral chest and a second incision was made more anteriorly.  The pleural effusion was accessed using the Seldinger technique. Fluoroscopy confirmed position of the wire within the pleural space. The catheter then was tunneled from the exit site to the entry site. The tract over the wire was dilated and then a peel-away sheath introducer was placed.  The  inner cannula was removed and the pleural catheter was advanced through the peel-away sheath which was then removed and the catheter was hooked to suction and 1.8 L of bloody fluid was evacuated.  The entry site incision was closed with a running 3-0 Vicryl subcuticular suture.  The catheter was secured at the exit site with a 3-0 nylon suture.  Dermabond was applied to the entry incision.  After draining the catheter, it was capped and then dressed in the usual fashion.  The patient tolerated the procedure well. He was taken to the postanesthetic care unit in good condition.     Revonda Standard Roxan Hockey, M.D.     SCH/MEDQ  D:  12/07/2014  T:  12/08/2014  Job:  [id number]

## 2014-12-08 NOTE — Progress Notes (Signed)
[facility] Telephone:([phone]   Fax:([phone]) [phone]  OFFICE PROGRESS NOTE  Brandon Kehr, MD [place] [address]  DIAGNOSIS: Recurrent non-small cell lung cancer initially diagnosed as Stage IB (T2a, N0, M0) non-small cell lung cancer consistent with invasive adenocarcinoma with tumor size 4.1 CM with visceral pleural invasion diagnosed in August 2015. 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 PDL 1 expression was 50%.  PRIOR THERAPY:  1) Status post left lower lobectomy with lymph node sampling in August of 2015 under the care of Dr. Roxan Hockey. 2) Adjuvant systemic chemotherapy with carboplatin for AUC of 5 and Alimta 500 MG/M2 every 3 weeks status post 4 cycles. 3) status post right Pleurx catheter placement by Dr. Roxan Hockey on 12/07/2014.  CURRENT THERAPY: Observation.  INTERVAL HISTORY: Brandon Newton 74 y.o. male returns to the clinic today for follow-up visit accompanied by his interpreter as well as his 2 daughters. The patient was recently admitted to [facility] with worsening dyspnea and right-sided chest pain. During his evaluation he had CT angiogram of the chest performed on 11/29/2014 and it showed no acute pulmonary embolism but there was significant right-sided pleural effusion with right lung atelectasis. There was also increased mediastinal adenopathy and questionable right hilar adenopathy. On 11/30/2014 the patient underwent ultrasound-guided right thoracentesis with drainage of 1.7 L of dark bloody fluid. The final cytology (Accession: [id number]) was consistent with adenocarcinoma. The tissue was sent for PDL 1 testing and it showed 50% expression. It was also sent for molecular studies. The patient was also seen by Dr. Roxan Hockey and he had Pleurx catheter placed on 12/07/2014 for recurrent right  pleural effusion. The patient is feeling a little bit better today. He has lack of appetite and he lost few pounds recently. The patient denied having any significant chest pain but continues to have shortness of breath with exertion with mild cough with no hemoptysis. No significant nausea or vomiting. He is here today for evaluation and discussion of his treatment options.  MEDICAL HISTORY: Past Medical History  Diagnosis Date  . Anxiety   . Hypertension   . Depression   . Squamous carcinoma (HCC)     "face & scale"  . Non-small cell carcinoma of left lung, stage 1 ([place]) 08/2013    postoperative chemotherapy  . Hypothyroidism   . Pleural effusion     Archie Endo 11/29/2014  . Dysrhythmia     after OR - resolved    ALLERGIES:  has No Known Allergies.  MEDICATIONS:  Current Outpatient Prescriptions  Medication Sig Dispense Refill  . escitalopram (LEXAPRO) 10 MG tablet Take 1 tablet (10 mg total) by mouth daily. 30 tablet 5  . fluticasone (FLONASE) 50 MCG/ACT nasal spray Place 2 sprays into both nostrils daily. 16 g 2  . levothyroxine (LEVOTHROID) 25 MCG tablet Take 1 tablet (25 mcg total) by mouth daily. 30 tablet 11  . metoprolol tartrate (LOPRESSOR) 25 MG tablet Take 1 tablet (25 mg total) by mouth 2 (two) times daily. 60 tablet 11  . Multiple Vitamin (MULTIVITAMIN) tablet Take 1 tablet by mouth daily.    Marland Kitchen oxyCODONE (OXY IR/ROXICODONE) 5 MG immediate release tablet Take 1-2 tablets (5-10 mg total) by mouth every 6 (six) hours as needed for moderate pain or severe pain. 30 tablet 0  . Polyethyl Glycol-Propyl Glycol (SYSTANE ULTRA OP) Place 1 drop into both eyes daily  as needed (for dry eyes).    Marland Kitchen tobramycin-dexamethasone (TOBRADEX) ophthalmic solution Place 2 drops into both eyes every 4 (four) hours while awake. 5 mL 0  . triamterene-hydrochlorothiazide (MAXZIDE-25) 37.5-25 MG per tablet TAKE ONE TABLET BY MOUTH EVERY MORNING 30 tablet 5  . Umeclidinium-Vilanterol (ANORO ELLIPTA)  62.5-25 MCG/INH AEPB Inhale 1 Act into the lungs daily. 1 each 11   No current facility-administered medications for this visit.    SURGICAL HISTORY:  Past Surgical History  Procedure Laterality Date  . Appendectomy    . Video assisted thoracoscopy (vats)/wedge resection Left 09/10/2013    Procedure: LEFT VIDEO ASSISTED THORACOSCOPY ,WEDGE RESECTION LEFT LOWER LOBE LUNG,LEFT LOWER LOBECTOMY, & NODE SAMPLING ;  Surgeon: Melrose Nakayama, MD;  Location: [facility];  Service: Thoracic;  Laterality: Left;  . Chest tube insertion Right 12/07/2014    Procedure: INSERTION PLEURAL DRAINAGE CATHETER;  Surgeon: Melrose Nakayama, MD;  Location: [facility];  Service: Thoracic;  Laterality: Right;    REVIEW OF SYSTEMS:  Constitutional: positive for fatigue and weight loss Eyes: negative Ears, nose, mouth, throat, and face: negative Respiratory: positive for dyspnea on exertion Cardiovascular: negative Gastrointestinal: negative Genitourinary:negative Integument/breast: negative Hematologic/lymphatic: negative Musculoskeletal:negative Neurological: negative Behavioral/Psych: negative Endocrine: negative Allergic/Immunologic: negative   PHYSICAL EXAMINATION: General appearance: alert, cooperative and no distress Head: Normocephalic, without obvious abnormality, atraumatic Neck: no adenopathy, no JVD, supple, symmetrical, trachea midline and thyroid not enlarged, symmetric, no tenderness/mass/nodules Lymph nodes: Cervical, supraclavicular, and axillary nodes normal. Resp: clear to auscultation bilaterally Back: symmetric, no curvature. ROM normal. No CVA tenderness. Cardio: regular rate and rhythm, S1, S2 normal, no murmur, click, rub or gallop GI: soft, non-tender; bowel sounds normal; no masses,  no organomegaly Extremities: extremities normal, atraumatic, no cyanosis or edema Neurologic: Alert and oriented X 3, normal strength and tone. Normal symmetric reflexes. Normal coordination and  gait  ECOG PERFORMANCE STATUS: 1 - Symptomatic but completely ambulatory  Blood pressure 97/60, pulse 91, temperature 97.7 F (36.5 C), temperature source Oral, resp. rate 19, height 5' 7"  (1.702 m), weight 176 lb 4.8 oz (79.969 kg), SpO2 96 %.  LABORATORY DATA: Lab Results  Component Value Date   WBC 11.6* 11/30/2014   HGB 15.5 11/30/2014   HCT 45.3 11/30/2014   MCV 92.3 11/30/2014   PLT 264 11/30/2014      Chemistry      Component Value Date/Time   NA 136 11/30/2014 0529   NA 141 06/14/2014 1126   K 3.5 11/30/2014 0529   K 3.9 06/14/2014 1126   CL 94* 11/30/2014 0529   CO2 30 11/30/2014 0529   CO2 24 06/14/2014 1126   BUN 19 11/30/2014 0529   BUN 18.8 06/14/2014 1126   CREATININE 1.56* 11/30/2014 0529   CREATININE 1.5* 06/14/2014 1126      Component Value Date/Time   CALCIUM 9.4 11/30/2014 0529   CALCIUM 9.2 06/14/2014 1126   ALKPHOS 55 11/28/2014 1708   ALKPHOS 71 06/14/2014 1126   AST 18 11/28/2014 1708   AST 15 06/14/2014 1126   ALT 13 11/28/2014 1708   ALT 18 06/14/2014 1126   BILITOT 0.7 11/28/2014 1708   BILITOT 0.78 06/14/2014 1126       RADIOGRAPHIC STUDIES: Dg Chest 1 View  11/30/2014  CLINICAL DATA:  Status post right thoracentesis today. Postprocedural examination. EXAM: CHEST 1 VIEW COMPARISON:  CT chest and PA and lateral chest 11/29/2014. FINDINGS: Right pleural effusion is decreased after thoracentesis. No pneumothorax is identified. Very small left pleural  effusion is noted. Basilar airspace disease is worse on the right. 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 IMPRESSION: Decreased right pleural effusion after thoracentesis. No pneumothorax or other new abnormality. Electronically Signed   By: Inge Rise M.D.   On: 11/30/2014 14:36   Dg Chest 2 View  12/05/2014  CLINICAL DATA:  Shortness of breath. Left lower lung resection 09/10/2013 EXAM: CHEST  2 VIEW COMPARISON:  12/01/2014 FINDINGS: Moderate size right pleural effusion.  Mild right basilar atelectasis. Partial left lower lobectomy. Blunting of the left costophrenic angle likely postsurgical. No focal consolidation. No pneumothorax. Stable cardiomediastinal silhouette. The osseous structures are unremarkable. IMPRESSION: Moderate right pleural effusion which has increased compared with 12/01/2014. Electronically Signed   By: Kathreen Devoid   On: 12/05/2014 13:10   Dg Chest 2 View  12/01/2014  CLINICAL DATA:  Effusions, cough. EXAM: CHEST  2 VIEW COMPARISON:  11/30/2014 and 11/29/2014 FINDINGS: Lungs are somewhat hypoinflated with stable small to moderate right pleural effusion likely with associated atelectasis in the right base. Possible small amount left pleural fluid unchanged. Linear atelectasis left midlung. Cardiomediastinal silhouette and remainder of the exam is unchanged. IMPRESSION: Stable small to moderate right pleural effusion likely with associated basilar atelectasis. Possible small amount left pleural fluid. Electronically Signed   By: Marin Olp M.D.   On: 12/01/2014 13:00   Dg Chest 2 View  11/29/2014  CLINICAL DATA:  Shortness of breath. EXAM: CHEST  2 VIEW COMPARISON:  11/28/2014.  CT 06/21/2014 and 03/15/2014. FINDINGS: Mediastinum and hilar structures are normal. Bibasilar subsegmental atelectasis and/or scarring again noted. No significant interim change. Small bilateral effusions. Heart size stable. No pneumothorax. No acute osseous abnormality P IMPRESSION: Bibasilar atelectasis and/or infiltrates with small bilateral pleural effusions, right side greater than left. No interim change from prior exam. Electronically Signed   By: [place]   On: 11/29/2014 14:06   Dg Chest 2 View  11/29/2014  CLINICAL DATA:  Shortness of breath for 3 days. History of lung cancer, hypertension. EXAM: CHEST  2 VIEW COMPARISON:  11/09/2013 FINDINGS: There is a moderate right pleural effusion with right lower lobe atelectasis or infiltrate. Small left pleural  effusion with left base atelectasis. Heart is normal size. Mediastinal contours are within normal limits. 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 No acute bony abnormality. IMPRESSION: Bilateral pleural effusions, right greater than left. Minimal left base atelectasis. Right lower lobe atelectasis or consolidation. Electronically Signed   By: Rolm Baptise M.D.   On: 11/29/2014 08:12   Ct Angio Chest Pe W/cm &/or Wo Cm  11/29/2014  CLINICAL DATA:  PT W/ SHOB, MID CP AND ELEVATED D-DIMER H/O left lower LOBECTOMY DUE TO CANCER IN 2015 CONCERN FOR PE EXAM: CT ANGIOGRAPHY CHEST WITH CONTRAST TECHNIQUE: Multidetector CT imaging of the chest was performed using the standard protocol during bolus administration of intravenous contrast. Multiplanar CT image reconstructions and MIPs were obtained to evaluate the vascular anatomy. CONTRAST:  80 cc OMNIPAQUE IOHEXOL 350 MG/ML SOLN COMPARISON:  06/21/2014 FINDINGS: Heart: There significant coronary artery calcification. No pericardial effusion. Vascular structures: Pulmonary arteries are well opacified. No acute pulmonary embolus. There is tortuosity of the thoracic aorta. Significant atherosclerotic calcification is present. No aneurysm. Mediastinum/thyroid: The visualized portion of the thyroid gland has a normal appearance. Right hilar density raises question of right hilar adenopathy this is difficult to assess given the presence of significant pleural effusion. There are mediastinal lymph nodes which have increased since prior study. AP window  node now measures 1.4 cm short axis dimension. Subcarinal lymph node is 1.0 cm short axis dimension. Small calcified lymph node is identified the subcarinal region, stable in appearance. Lungs/Airways: Large right pleural effusion is present. There is atelectasis of the right upper and lower lobes. There is volume loss the left lung base. No suspicious pulmonary nodules. Upper abdomen: Visualized portion of the pancreas  an liver are normal.The adrenal glands are not fully imaged. Chest wall/osseous structures: There is a new superior endplate fracture T9, with approximately 30% loss of anterior height Review of the MIP images confirms the above findings. IMPRESSION: 1. Technically adequate exam.  There is no acute pulmonary embolus. 2. Significant right-sided pleural effusion and right lung atelectasis. Findings raise a question of malignant ascites. 3. Increased mediastinal adenopathy. Question of right hilar adenopathy. 4. Significant coronary artery disease. 5. Interval superior endplate fracture of T9 with 30% loss of anterior height. Electronically Signed   By: Nolon Nations M.D.   On: 11/29/2014 16:49   Dg Chest 2v Repeat Same Day  12/05/2014  CLINICAL DATA:  Status post right-sided thoracentesis. Non-small-cell carcinoma of left lung. EXAM: CHEST  2 VIEW COMPARISON:  12/05/2014 at 1322 hours FINDINGS: 1428 hours. Midline trachea. Mild cardiomegaly. Trace left pleural fluid is similar. Decrease in small right pleural effusion. No pneumothorax. Improved right base aeration. Left base scarring with volume loss. IMPRESSION: Decreased small right pleural effusion and improved adjacent right base airspace disease. No pneumothorax. Electronically Signed   By: Abigail Miyamoto M.D.   On: 12/05/2014 14:16   Dg Chest Port 1 View  12/07/2014  CLINICAL DATA:  preop.  Post thoracentesis. EXAM: PORTABLE CHEST 1 VIEW COMPARISON:  Radiograph 12/05/2014 FINDINGS: Normal cardiac silhouette. There are low lung volumes. There is interval increase in the RIGHT pleural effusion. No pneumothorax. Nonunion LEFT clavicle fracture. IMPRESSION: 1. Persistent and mildly increased RIGHT pleural effusion. 2. No pneumothorax. 3. Low lung volumes. 4. Nonunion LEFT clavicle fracture. Electronically Signed   By: Suzy Bouchard M.D.   On: 12/07/2014 13:19   Dg C-arm 1-60 Min-no Report  12/07/2014  CLINICAL DATA: intra op C-ARM 1-60 MINUTES  Fluoroscopy was utilized by the requesting physician.  No radiographic interpretation.   US Thoracentesis Asp Pleural Space W/img Guide  11/30/2014  INDICATION: Patient with history of non small cell left lung cancer 2015; now with dyspnea and large right pleural effusion. Request made for diagnostic/therapeutic right thoracentesis. EXAM: ULTRASOUND GUIDED DIAGNOSTIC AND THERAPEUTIC RIGHT THORACENTESIS COMPARISON:  None. MEDICATIONS: None COMPLICATIONS: None immediate TECHNIQUE: Informed written consent was obtained from the patient after a discussion of the risks, benefits and alternatives to treatment. A timeout was performed prior to the initiation of the procedure. Initial ultrasound scanning demonstrates a large right pleural effusion. The lower chest was prepped and draped in the usual sterile fashion. 1% lidocaine was used for local anesthesia. An ultrasound image was saved for documentation purposes. A 6 Fr Safe-T-Centesis catheter was introduced. The thoracentesis was performed. The catheter was removed and a dressing was applied. The patient tolerated the procedure well without immediate post procedural complication. The patient was escorted to have an upright chest radiograph. FINDINGS: A total of approximately 1.7 liters of dark, bloody fluid was removed. Requested samples were sent to the laboratory. IMPRESSION: Successful ultrasound-guided diagnostic/therapeutic right sided thoracentesis yielding 1.7 liters of pleural fluid. Read by:  Rowe Robert, [facility] Electronically Signed   By: Sandi Mariscal M.D.   On: 11/30/2014 14:35   ASSESSMENT AND PLAN:  this is a very pleasant 74 years old white male with recurrent non-small cell lung cancer presented with malignant right pleural effusion in addition to mediastinal lymphadenopathy. The patient has a history of stage IB non-small cell lung cancer, adenocarcinoma status post left lower lobectomy with lymph node dissection. He completed adjuvant chemotherapy  with carboplatin and Alimta status post 4 cycles. His PDL 1 expression is 50%. I had a lengthy discussion with the patient and his family today about his current disease stage, prognosis and treatment options. I recommended for the patient to complete the staging workup by ordering a PET scan in addition to MRI of the brain to rule out other metastatic disease. I discussed with the patient several options for treatment of his condition including palliative care and hospice referral versus consideration of treatment with immunotherapy since his PDL 1 expression is 50% versus treatment with targeted therapy if he has positive EGFR mutation or ALK gene translocation. The molecular studies are still pending. I will arrange for the patient to come back for follow-up visit in 2 weeks for reevaluation and more detailed discussion of his treatment options based on the staging workup and molecular studies. For the recurrent right pleural effusion, the patient will continue drainage via the Pleurx catheter. The patient and his family agreed to the current plan. He was advised to call immediately if he has any concerning symptoms in the interval. The patient voices understanding of current disease status and treatment options and is in agreement with the current care plan.  All questions were answered. The patient knows to call the clinic with any problems, questions or concerns. We can certainly see the patient much sooner if necessary.  Disclaimer: This note was dictated with voice recognition software. Similar sounding words can inadvertently be transcribed and may not be corrected upon review.

## 2014-12-08 NOTE — Telephone Encounter (Signed)
Gave and prnted appt sched and avs for pt for NOV

## 2014-12-09 ENCOUNTER — Telehealth: Payer: Self-pay | Admitting: Medical Oncology

## 2014-12-09 ENCOUNTER — Other Ambulatory Visit: Payer: Self-pay | Admitting: Medical Oncology

## 2014-12-09 DIAGNOSIS — R110 Nausea: Secondary | ICD-10-CM

## 2014-12-09 MED ORDER — ONDANSETRON HCL 8 MG PO TABS: 4.0000 mg | ORAL_TABLET | Freq: Three times a day (TID) | ORAL | Status: AC | PRN

## 2014-12-09 NOTE — Telephone Encounter (Signed)
I returned call to [facility] concerned about appts not scheduled. I gave her number for central scheduling to get appts for scans.

## 2014-12-13 ENCOUNTER — Other Ambulatory Visit: Payer: BLUE CROSS/BLUE SHIELD

## 2014-12-14 ENCOUNTER — Ambulatory Visit (HOSPITAL_COMMUNITY): Payer: BLUE CROSS/BLUE SHIELD

## 2014-12-19 ENCOUNTER — Ambulatory Visit (HOSPITAL_COMMUNITY)
Admission: RE | Admit: 2014-12-19 | Discharge: 2014-12-19 | Disposition: A | Discharge status: Home / Self Care | Payer: BLUE CROSS/BLUE SHIELD | Source: Ambulatory Visit | Attending: Internal Medicine | Admitting: Internal Medicine

## 2014-12-19 DIAGNOSIS — R590 Localized enlarged lymph nodes: Secondary | ICD-10-CM | POA: Insufficient documentation

## 2014-12-19 DIAGNOSIS — J3489 Other specified disorders of nose and nasal sinuses: Secondary | ICD-10-CM | POA: Insufficient documentation

## 2014-12-19 DIAGNOSIS — I679 Cerebrovascular disease, unspecified: Secondary | ICD-10-CM | POA: Insufficient documentation

## 2014-12-19 DIAGNOSIS — I7090 Unspecified atherosclerosis: Secondary | ICD-10-CM | POA: Insufficient documentation

## 2014-12-19 DIAGNOSIS — I723 Aneurysm of iliac artery: Secondary | ICD-10-CM | POA: Insufficient documentation

## 2014-12-19 DIAGNOSIS — C3491 Malignant neoplasm of unspecified part of right bronchus or lung: Secondary | ICD-10-CM

## 2014-12-19 DIAGNOSIS — J910 Malignant pleural effusion: Secondary | ICD-10-CM | POA: Diagnosis not present

## 2014-12-19 DIAGNOSIS — I2510 Atherosclerotic heart disease of native coronary artery without angina pectoris: Secondary | ICD-10-CM | POA: Insufficient documentation

## 2014-12-19 LAB — GLUCOSE, CAPILLARY: GLUCOSE-CAPILLARY: 82 mg/dL (ref 65–99)

## 2014-12-19 MED ORDER — FLUDEOXYGLUCOSE F - 18 (FDG) INJECTION
9.3000 | Freq: Once | INTRAVENOUS | Status: AC | PRN
  Administered 2014-12-19: 9.3 via INTRAVENOUS

## 2014-12-19 MED ORDER — GADOBENATE DIMEGLUMINE 529 MG/ML IV SOLN
9.0000 mL | Freq: Once | INTRAVENOUS | Status: AC | PRN
  Administered 2014-12-19: 9 mL via INTRAVENOUS

## 2014-12-20 ENCOUNTER — Telehealth: Payer: Self-pay | Admitting: Internal Medicine

## 2014-12-20 ENCOUNTER — Encounter: Payer: Self-pay | Admitting: Gastroenterology

## 2014-12-20 ENCOUNTER — Ambulatory Visit (HOSPITAL_BASED_OUTPATIENT_CLINIC_OR_DEPARTMENT_OTHER): Payer: BLUE CROSS/BLUE SHIELD | Admitting: Internal Medicine

## 2014-12-20 ENCOUNTER — Other Ambulatory Visit (HOSPITAL_BASED_OUTPATIENT_CLINIC_OR_DEPARTMENT_OTHER): Payer: BLUE CROSS/BLUE SHIELD

## 2014-12-20 ENCOUNTER — Encounter: Payer: Self-pay | Admitting: Internal Medicine

## 2014-12-20 VITALS — BP 100/60 | HR 81 | Temp 97.5°F | Resp 17 | Ht 66.0 in | Wt 175.2 lb

## 2014-12-20 DIAGNOSIS — C3491 Malignant neoplasm of unspecified part of right bronchus or lung: Secondary | ICD-10-CM

## 2014-12-20 DIAGNOSIS — R5382 Chronic fatigue, unspecified: Secondary | ICD-10-CM

## 2014-12-20 DIAGNOSIS — J90 Pleural effusion, not elsewhere classified: Secondary | ICD-10-CM

## 2014-12-20 DIAGNOSIS — E46 Unspecified protein-calorie malnutrition: Secondary | ICD-10-CM | POA: Diagnosis not present

## 2014-12-20 LAB — CBC WITH DIFFERENTIAL/PLATELET
BASO%: 1.3 % (ref 0.0–2.0)
Basophils Absolute: 0.1 10*3/uL (ref 0.0–0.1)
EOS%: 3.1 % (ref 0.0–7.0)
Eosinophils Absolute: 0.3 10*3/uL (ref 0.0–0.5)
HEMATOCRIT: 36.5 % — AB (ref 38.4–49.9)
HEMOGLOBIN: 12.2 g/dL — AB (ref 13.0–17.1)
LYMPH%: 12.3 % — ABNORMAL LOW (ref 14.0–49.0)
MCH: 30.4 pg (ref 27.2–33.4)
MCHC: 33.6 g/dL (ref 32.0–36.0)
MCV: 90.5 fL (ref 79.3–98.0)
MONO#: 0.8 10*3/uL (ref 0.1–0.9)
MONO%: 7.6 % (ref 0.0–14.0)
NEUT#: 8.1 10*3/uL — ABNORMAL HIGH (ref 1.5–6.5)
NEUT%: 75.7 % — ABNORMAL HIGH (ref 39.0–75.0)
Platelets: 336 10*3/uL (ref 140–400)
RBC: 4.03 10*6/uL — ABNORMAL LOW (ref 4.20–5.82)
RDW: 14.2 % (ref 11.0–14.6)
WBC: 10.7 10*3/uL — AB (ref 4.0–10.3)
lymph#: 1.3 10*3/uL (ref 0.9–3.3)

## 2014-12-20 LAB — COMPREHENSIVE METABOLIC PANEL (CC13)
ALBUMIN: 2.7 g/dL — AB (ref 3.5–5.0)
ALK PHOS: 65 U/L (ref 40–150)
ALT: 17 U/L (ref 0–55)
AST: 16 U/L (ref 5–34)
Anion Gap: 8 mEq/L (ref 3–11)
BUN: 14.5 mg/dL (ref 7.0–26.0)
CALCIUM: 9.6 mg/dL (ref 8.4–10.4)
CHLORIDE: 98 meq/L (ref 98–109)
CO2: 29 mEq/L (ref 22–29)
CREATININE: 0.9 mg/dL (ref 0.7–1.3)
EGFR: 83 mL/min/{1.73_m2} — ABNORMAL LOW (ref >90)
GLUCOSE: 98 mg/dL (ref 70–140)
POTASSIUM: 3.9 meq/L (ref 3.5–5.1)
SODIUM: 135 meq/L — AB (ref 136–145)
Total Bilirubin: 0.54 mg/dL (ref 0.20–1.20)
Total Protein: 6.5 g/dL (ref 6.4–8.3)

## 2014-12-20 MED ORDER — OXYCODONE HCL 5 MG PO TABS: 5.0000 mg | ORAL_TABLET | Freq: Four times a day (QID) | ORAL | Status: AC | PRN

## 2014-12-20 NOTE — Telephone Encounter (Signed)
Patty from [facility] is concerned about pts low bp. He is taking metoprolol. Not sure if any adjustments needs to be made. It read 120/66 yesterday before he took his meds. He is having dizziness and sob when he stands up. Please call her at [phone]

## 2014-12-20 NOTE — Telephone Encounter (Signed)
pls stop metoprolol Thx

## 2014-12-20 NOTE — Telephone Encounter (Signed)
Gave and pritnd appt sched and avs fo rpt for DEC thru Jan 2017

## 2014-12-20 NOTE — Progress Notes (Signed)
[place] Telephone:([phone]) [phone]   Fax:(336) [phone]  OFFICE PROGRESS NOTE  Brandon Kehr, MD [place] [address]  DIAGNOSIS: Recurrent non-small cell lung cancer, adenocarcinoma with negative EGFR mutation and negative ALK gene translocation initially diagnosed as Stage IB (T2a, N0, M0) non-small cell lung cancer consistent with invasive adenocarcinoma with tumor size 4.1 CM with visceral pleural invasion diagnosed in August 2015. 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 PDL 1 expression was 50%.  PRIOR THERAPY:  1) Status post left lower lobectomy with lymph node sampling in August of 2015 under the care of Dr. Roxan Hockey. 2) Adjuvant systemic chemotherapy with carboplatin for AUC of 5 and Alimta 500 MG/M2 every 3 weeks status post 4 cycles. 3) status post right Pleurx catheter placement by Dr. Roxan Hockey on 12/07/2014.  CURRENT THERAPY: Ketruda (pembrolizumab) 200 mg IV every 3 weeks. First dose 12/27/2014.  INTERVAL HISTORY: Brandon Newton 74 y.o. male returns to the clinic today for follow-up visit accompanied by his interpreter as well as his daughter and son-in-law. The patient is feeling a little bit better today but continues to have shortness of breath with exertion. He continues drainage of the Pleurx catheter in the range of 300 ML every other day. The patient denied having any significant chest pain but continues to have shortness of breath with exertion with mild cough with no hemoptysis. No significant nausea or vomiting. He had several studies performed recently including molecular study by [facility] one for EGFR mutation and ALK gene translocation that were reported to be negative. He also had PDL 1 expression that was reported to be 50%. The patient also had MRI of the brain that was negative for metastatic  disease. He also had a PET scan and he is here today for evaluation and discussion of his scan results and treatment options.  MEDICAL HISTORY: Past Medical History  Diagnosis Date  . Anxiety   . Hypertension   . Depression   . Squamous carcinoma (HCC)     "face & scale"  . Non-small cell carcinoma of left lung, stage 1 ([facility]) 08/2013    postoperative chemotherapy  . Hypothyroidism   . Pleural effusion     Archie Endo 11/29/2014  . Dysrhythmia     after OR - resolved    ALLERGIES:  has No Known Allergies.  MEDICATIONS:  Current Outpatient Prescriptions  Medication Sig Dispense Refill  . escitalopram (LEXAPRO) 10 MG tablet Take 1 tablet (10 mg total) by mouth daily. 30 tablet 5  . fluticasone (FLONASE) 50 MCG/ACT nasal spray Place 2 sprays into both nostrils daily. 16 g 2  . levothyroxine (LEVOTHROID) 25 MCG tablet Take 1 tablet (25 mcg total) by mouth daily. 30 tablet 11  . metoprolol tartrate (LOPRESSOR) 25 MG tablet Take 1 tablet (25 mg total) by mouth 2 (two) times daily. 60 tablet 11  . Multiple Vitamin (MULTIVITAMIN) tablet Take 1 tablet by mouth daily.    . ondansetron (ZOFRAN) 8 MG tablet Take 0.5 tablets (4 mg total) by mouth every 8 (eight) hours as needed for nausea or vomiting. 20 tablet 0  . oxyCODONE (OXY IR/ROXICODONE) 5 MG immediate release tablet Take 1-2 tablets (5-10 mg total) by mouth every 6 (six) hours as needed for moderate pain or severe pain. 30 tablet 0  . Polyethyl Glycol-Propyl Glycol (SYSTANE ULTRA OP) Place 1 drop into both eyes daily as  needed (for dry eyes).    Marland Kitchen tobramycin-dexamethasone (TOBRADEX) ophthalmic solution Place 2 drops into both eyes every 4 (four) hours while awake. 5 mL 0  . triamterene-hydrochlorothiazide (MAXZIDE-25) 37.5-25 MG per tablet TAKE ONE TABLET BY MOUTH EVERY MORNING 30 tablet 5  . Umeclidinium-Vilanterol (ANORO ELLIPTA) 62.5-25 MCG/INH AEPB Inhale 1 Act into the lungs daily. 1 each 11   No current facility-administered medications  for this visit.    SURGICAL HISTORY:  Past Surgical History  Procedure Laterality Date  . Appendectomy    . Video assisted thoracoscopy (vats)/wedge resection Left 09/10/2013    Procedure: LEFT VIDEO ASSISTED THORACOSCOPY ,WEDGE RESECTION LEFT LOWER LOBE LUNG,LEFT LOWER LOBECTOMY, & NODE SAMPLING ;  Surgeon: Melrose Nakayama, MD;  Location: [facility];  Service: Thoracic;  Laterality: Left;  . Chest tube insertion Right 12/07/2014    Procedure: INSERTION PLEURAL DRAINAGE CATHETER;  Surgeon: Melrose Nakayama, MD;  Location: [facility];  Service: Thoracic;  Laterality: Right;    REVIEW OF SYSTEMS:  Constitutional: positive for fatigue and weight loss Eyes: negative Ears, nose, mouth, throat, and face: negative Respiratory: positive for dyspnea on exertion Cardiovascular: negative Gastrointestinal: negative Genitourinary:negative Integument/breast: negative Hematologic/lymphatic: negative Musculoskeletal:negative Neurological: negative Behavioral/Psych: negative Endocrine: negative Allergic/Immunologic: negative   PHYSICAL EXAMINATION: General appearance: alert, cooperative and no distress Head: Normocephalic, without obvious abnormality, atraumatic Neck: no adenopathy, no JVD, supple, symmetrical, trachea midline and thyroid not enlarged, symmetric, no tenderness/mass/nodules Lymph nodes: Cervical, supraclavicular, and axillary nodes normal. Resp: clear to auscultation bilaterally Back: symmetric, no curvature. ROM normal. No CVA tenderness. Cardio: regular rate and rhythm, S1, S2 normal, no murmur, click, rub or gallop GI: soft, non-tender; bowel sounds normal; no masses,  no organomegaly Extremities: extremities normal, atraumatic, no cyanosis or edema Neurologic: Alert and oriented X 3, normal strength and tone. Normal symmetric reflexes. Normal coordination and gait  ECOG PERFORMANCE STATUS: 1 - Symptomatic but completely ambulatory  Blood pressure 100/60, pulse 81,  temperature 97.5 F (36.4 C), temperature source Oral, resp. rate 17, height _0  (1.676 m), weight 175 lb 3.2 oz (79.47 kg), SpO2 96 %.  LABORATORY DATA: Lab Results  Component Value Date   WBC 10.7* 12/20/2014   HGB 12.2* 12/20/2014   HCT 36.5* 12/20/2014   MCV 90.5 12/20/2014   PLT 336 12/20/2014      Chemistry      Component Value Date/Time   NA 136 11/30/2014 0529   NA 141 06/14/2014 1126   K 3.5 11/30/2014 0529   K 3.9 06/14/2014 1126   CL 94* 11/30/2014 0529   CO2 30 11/30/2014 0529   CO2 24 06/14/2014 1126   BUN 19 11/30/2014 0529   BUN 18.8 06/14/2014 1126   CREATININE 1.56* 11/30/2014 0529   CREATININE 1.5* 06/14/2014 1126      Component Value Date/Time   CALCIUM 9.4 11/30/2014 0529   CALCIUM 9.2 06/14/2014 1126   ALKPHOS 55 11/28/2014 1708   ALKPHOS 71 06/14/2014 1126   AST 18 11/28/2014 1708   AST 15 06/14/2014 1126   ALT 13 11/28/2014 1708   ALT 18 06/14/2014 1126   BILITOT 0.7 11/28/2014 1708   BILITOT 0.78 06/14/2014 1126       RADIOGRAPHIC STUDIES: Dg Chest 1 View  11/30/2014  CLINICAL DATA:  Status post right thoracentesis today. Postprocedural examination. EXAM: CHEST 1 VIEW COMPARISON:  CT chest and PA and lateral chest 11/29/2014. FINDINGS: Right pleural effusion is decreased after thoracentesis. No pneumothorax is identified. Very small left pleural effusion  is noted. Basilar airspace disease is worse on the right. 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 IMPRESSION: Decreased right pleural effusion after thoracentesis. No pneumothorax or other new abnormality. Electronically Signed   By: Inge Rise M.D.   On: 11/30/2014 14:36   Dg Chest 2 View  12/05/2014  CLINICAL DATA:  Shortness of breath. Left lower lung resection 09/10/2013 EXAM: CHEST  2 VIEW COMPARISON:  12/01/2014 FINDINGS: Moderate size right pleural effusion. Mild right basilar atelectasis. Partial left lower lobectomy. Blunting of the left costophrenic angle likely  postsurgical. No focal consolidation. No pneumothorax. Stable cardiomediastinal silhouette. The osseous structures are unremarkable. IMPRESSION: Moderate right pleural effusion which has increased compared with 12/01/2014. Electronically Signed   By: Kathreen Devoid   On: 12/05/2014 13:10   Dg Chest 2 View  12/01/2014  CLINICAL DATA:  Effusions, cough. EXAM: CHEST  2 VIEW COMPARISON:  11/30/2014 and 11/29/2014 FINDINGS: Lungs are somewhat hypoinflated with stable small to moderate right pleural effusion likely with associated atelectasis in the right base. Possible small amount left pleural fluid unchanged. Linear atelectasis left midlung. Cardiomediastinal silhouette and remainder of the exam is unchanged. IMPRESSION: Stable small to moderate right pleural effusion likely with associated basilar atelectasis. Possible small amount left pleural fluid. Electronically Signed   By: Marin Olp M.D.   On: 12/01/2014 13:00   Dg Chest 2 View  11/29/2014  CLINICAL DATA:  Shortness of breath. EXAM: CHEST  2 VIEW COMPARISON:  11/28/2014.  CT 06/21/2014 and 03/15/2014. FINDINGS: Mediastinum and hilar structures are normal. Bibasilar subsegmental atelectasis and/or scarring again noted. No significant interim change. Small bilateral effusions. Heart size stable. No pneumothorax. No acute osseous abnormality P IMPRESSION: Bibasilar atelectasis and/or infiltrates with small bilateral pleural effusions, right side greater than left. No interim change from prior exam. Electronically Signed   By: [place]   On: 11/29/2014 14:06   Dg Chest 2 View  11/29/2014  CLINICAL DATA:  Shortness of breath for 3 days. History of lung cancer, hypertension. EXAM: CHEST  2 VIEW COMPARISON:  11/09/2013 FINDINGS: There is a moderate right pleural effusion with right lower lobe atelectasis or infiltrate. Small left pleural effusion with left base atelectasis. Heart is normal size. Mediastinal contours are within normal limits. 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 No acute bony abnormality. IMPRESSION: Bilateral pleural effusions, right greater than left. Minimal left base atelectasis. Right lower lobe atelectasis or consolidation. Electronically Signed   By: Rolm Baptise M.D.   On: 11/29/2014 08:12   Ct Angio Chest Pe W/cm &/or Wo Cm  11/29/2014  CLINICAL DATA:  PT W/ SHOB, MID CP AND ELEVATED D-DIMER H/O left lower LOBECTOMY DUE TO CANCER IN 2015 CONCERN FOR PE EXAM: CT ANGIOGRAPHY CHEST WITH CONTRAST TECHNIQUE: Multidetector CT imaging of the chest was performed using the standard protocol during bolus administration of intravenous contrast. Multiplanar CT image reconstructions and MIPs were obtained to evaluate the vascular anatomy. CONTRAST:  80 cc OMNIPAQUE IOHEXOL 350 MG/ML SOLN COMPARISON:  06/21/2014 FINDINGS: Heart: There significant coronary artery calcification. No pericardial effusion. Vascular structures: Pulmonary arteries are well opacified. No acute pulmonary embolus. There is tortuosity of the thoracic aorta. Significant atherosclerotic calcification is present. No aneurysm. Mediastinum/thyroid: The visualized portion of the thyroid gland has a normal appearance. Right hilar density raises question of right hilar adenopathy this is difficult to assess given the presence of significant pleural effusion. There are mediastinal lymph nodes which have increased since prior study. AP window node  now measures 1.4 cm short axis dimension. Subcarinal lymph node is 1.0 cm short axis dimension. Small calcified lymph node is identified the subcarinal region, stable in appearance. Lungs/Airways: Large right pleural effusion is present. There is atelectasis of the right upper and lower lobes. There is volume loss the left lung base. No suspicious pulmonary nodules. Upper abdomen: Visualized portion of the pancreas an liver are normal.The adrenal glands are not fully imaged. Chest wall/osseous structures: There is a new  superior endplate fracture T9, with approximately 30% loss of anterior height Review of the MIP images confirms the above findings. IMPRESSION: 1. Technically adequate exam.  There is no acute pulmonary embolus. 2. Significant right-sided pleural effusion and right lung atelectasis. Findings raise a question of malignant ascites. 3. Increased mediastinal adenopathy. Question of right hilar adenopathy. 4. Significant coronary artery disease. 5. Interval superior endplate fracture of T9 with 30% loss of anterior height. Electronically Signed   By: Nolon Nations M.D.   On: 11/29/2014 16:49   Mr Jeri Cos IO Contrast  12/19/2014  CLINICAL DATA:  74 year old male with generalized weakness and difficulty walking. History of lung cancer and hypertension. Initial encounter. EXAM: MRI HEAD WITHOUT AND WITH CONTRAST TECHNIQUE: Multiplanar, multiecho pulse sequences of the brain and surrounding structures were obtained without and with intravenous contrast. CONTRAST:  63m MULTIHANCE GADOBENATE DIMEGLUMINE 529 MG/ML IV SOLN COMPARISON:  No comparison brain MR or head CT. FINDINGS: Exam is motion degraded. No acute infarct. No intracranial hemorrhage. No intracranial mass or bony destructive lesion to suggest presence of intracranial metastatic disease. Evaluation slightly limited by the degree of motion. Mild small vessel disease type changes. Global atrophy without hydrocephalus. Major intracranial vascular structures are patent. Partial opacification mastoid air cells without obstructing lesion of eustachian tube noted. Minimal paranasal sinus mucosal thickening. Cervical medullary junction, pituitary region, pineal region and orbital structures unremarkable. IMPRESSION: Exam is motion degraded. No acute infarct or intracranial hemorrhage. No intracranial mass or bony destructive lesion to suggest presence of intracranial metastatic disease. Evaluation slightly limited by the degree of motion. Mild small vessel disease  type changes. Global atrophy without hydrocephalus. Partial opacification mastoid air cells. Minimal paranasal sinus mucosal thickening. Electronically Signed   By: SGenia DelM.D.   On: 12/19/2014 17:37   Nm Pet Image Restag (ps) Skull Base To Thigh  12/19/2014  CLINICAL DATA:  Subsequent treatment strategy for lung cancer. Restaging examination. EXAM: NUCLEAR MEDICINE PET SKULL BASE TO THIGH TECHNIQUE: 9.3 mCi F-18 FDG was injected intravenously. Full-ring PET imaging was performed from the skull base to thigh after the radiotracer. CT data was obtained and used for attenuation correction and anatomic localization. FASTING BLOOD GLUCOSE:  Value: 82 mg/dl COMPARISON:  Chest CT 11/29/2014.  PET-CT 08/31/2013. FINDINGS: NECK Nonenlarged but hypermetabolic (SUVmax = 62.7-0.3 right-sided cervical lymph nodes (level III and level IV). CHEST Diffuse hypermetabolism throughout the right pleural space (SUVmax = 14.1-16.3), which may in part relate to malignant pleural involvement, but likely largely reflects recent talc pleurodesis. Several small loculated pleural fluid collections are noted. There is also some patchy hypermetabolism throughout the right lung with associated marked thickening of the peribronchovascular interstitium, favored to reflect lymphangitic spread of disease throughout the right lung. Status post left lower lobectomy. Small left pleural effusion has no associated hypermetabolism, layers dependently and is simple in appearance. There are numerous borderline enlarged and enlarged markedly hypermetabolic mediastinal and bilateral lymph nodes. Specific examples include hypermetabolic left hilar lymph nodes (SUVmax = 12.8), enlarged hypermetabolic (SUVmax =  30) 1.4 cm short axis AP window lymph node and 6 mm short axis low right paratracheal lymph node that is hypermetabolic (SUVmax = 52.4), in addition to numerous others. There is also some focal thickening and hypermetabolism in the mid thoracic  esophagus (SUVmax = 14.6). Heart size is normal. There is no significant pericardial fluid, thickening or pericardial calcification. There is atherosclerosis of the thoracic aorta, the great vessels of the mediastinum and the coronary arteries, including calcified atherosclerotic plaque in the left main, left anterior descending, left circumflex and right coronary arteries. Right-sided PleurX catheter in position. There is some soft tissue thickening and hypermetabolism adjacent to the catheter entry site in the lower right anterior chest wall. In addition, there is some hypermetabolism (SUVmax = 18.9) in the musculature of the lower right hemithorax, potentially at site of prior chest tube placement. ) ABDOMEN/PELVIS Hypermetabolism (SUVmax = 10.0) in the left adrenal gland without discrete nodule suspicious for a small metastatic lesion. 7 mm celiac axis lymph node (image 103 of series 4) is markedly hypermetabolic (SUVmax = 81.8), as is a nonenlarged retroperitoneal lymph node in the left para-aortic nodal station (image 117 of series 4) measuring 6 mm (SUVmax = 11.5). No abnormal hypermetabolic activity within the liver, pancreas, right adrenal gland or spleen. Extensive atherosclerosis throughout the abdominal and pelvic vasculature, including mild aneurysmal dilatation of the common iliac arteries bilaterally measuring 18 mm on the right and 17 mm on the left. No significant volume of ascites. No pneumoperitoneum. No pathologic distention of small bowel. SKELETON No focal hypermetabolic activity to suggest skeletal metastasis. IMPRESSION: 1. Today's study demonstrates marked progression of disease with widespread bilateral mediastinal and hilar lymphadenopathy, right cervical lymphadenopathy, malignant right pleural effusion, evidence of probable lymphangitic spread of tumor throughout the right lung, left adrenal metastasis and some nonenlarged but hypermetabolic upper abdominal and retroperitoneal lymph  nodes which are presumably metastatic. 2. Focal thickening and hypermetabolism of the mid esophagus. This may reflect esophagitis, but the possibility of concurrent esophageal neoplasm is not excluded. This could be further evaluated with endoscopy if of clinical concern. 3. Extensive atherosclerosis, including left main and 3 vessel coronary artery disease, in addition to bilateral common iliac artery aneurysms, as above. 4. Right-sided PleurX catheter in position appears appropriately located. Post procedural changes of recent right-sided talc pleurodesis, as above. Electronically Signed   By: Vinnie Langton M.D.   On: 12/19/2014 16:38   Dg Chest 2v Repeat Same Day  12/05/2014  CLINICAL DATA:  Status post right-sided thoracentesis. Non-small-cell carcinoma of left lung. EXAM: CHEST  2 VIEW COMPARISON:  12/05/2014 at 1322 hours FINDINGS: 1428 hours. Midline trachea. Mild cardiomegaly. Trace left pleural fluid is similar. Decrease in small right pleural effusion. No pneumothorax. Improved right base aeration. Left base scarring with volume loss. IMPRESSION: Decreased small right pleural effusion and improved adjacent right base airspace disease. No pneumothorax. Electronically Signed   By: Abigail Miyamoto M.D.   On: 12/05/2014 14:16   Dg Chest Port 1 View  12/07/2014  CLINICAL DATA:  preop.  Post thoracentesis. EXAM: PORTABLE CHEST 1 VIEW COMPARISON:  Radiograph 12/05/2014 FINDINGS: Normal cardiac silhouette. There are low lung volumes. There is interval increase in the RIGHT pleural effusion. No pneumothorax. Nonunion LEFT clavicle fracture. IMPRESSION: 1. Persistent and mildly increased RIGHT pleural effusion. 2. No pneumothorax. 3. Low lung volumes. 4. Nonunion LEFT clavicle fracture. Electronically Signed   By: Suzy Bouchard M.D.   On: 12/07/2014 13:19   Dg C-arm 1-60 Min-no Report  12/07/2014  CLINICAL DATA: intra op C-ARM 1-60 MINUTES Fluoroscopy was utilized by the requesting physician.  No  radiographic interpretation.   US Thoracentesis Asp Pleural Space W/img Guide  11/30/2014  INDICATION: Patient with history of non small cell left lung cancer 2015; now with dyspnea and large right pleural effusion. Request made for diagnostic/therapeutic right thoracentesis. EXAM: ULTRASOUND GUIDED DIAGNOSTIC AND THERAPEUTIC RIGHT THORACENTESIS COMPARISON:  None. MEDICATIONS: None COMPLICATIONS: None immediate TECHNIQUE: Informed written consent was obtained from the patient after a discussion of the risks, benefits and alternatives to treatment. A timeout was performed prior to the initiation of the procedure. Initial ultrasound scanning demonstrates a large right pleural effusion. The lower chest was prepped and draped in the usual sterile fashion. 1% lidocaine was used for local anesthesia. An ultrasound image was saved for documentation purposes. A 6 Fr Safe-T-Centesis catheter was introduced. The thoracentesis was performed. The catheter was removed and a dressing was applied. The patient tolerated the procedure well without immediate post procedural complication. The patient was escorted to have an upright chest radiograph. FINDINGS: A total of approximately 1.7 liters of dark, bloody fluid was removed. Requested samples were sent to the laboratory. IMPRESSION: Successful ultrasound-guided diagnostic/therapeutic right sided thoracentesis yielding 1.7 liters of pleural fluid. Read by:  Rowe Robert, [facility] Electronically Signed   By: Sandi Mariscal M.D.   On: 11/30/2014 14:35   ASSESSMENT AND PLAN: this is a very pleasant 74 years old white male with recurrent non-small cell lung cancer, adenocarcinoma with negative EGFR mutation and negative ALK gene translocation was PDL 1 expression 50%, presented with malignant right pleural effusion in addition to mediastinal lymphadenopathy. The patient has a history of stage IB non-small cell lung cancer, adenocarcinoma status post left lower lobectomy with lymph node  dissection. He completed adjuvant chemotherapy with carboplatin and Alimta status post 4 cycles. I discussed the MRI as well as PET scan results with the patient and his family and showed them the images. I discussed with the patient several options for treatment of his condition including palliative care and hospice referral versus consideration of treatment with immunotherapy with Ketruda (pembrolizumab) since he has PDL 1 expression 50% versus treatment with systemic chemotherapy. After discussion of all the options and the adverse effect, the patient is interested in proceeding with the immunotherapy. He will be treated with Ketruda (pembrolizumab) 200 mg IV every 3 weeks. I discussed with the patient adverse effect of this treatment including immune mediated skin rash, diarrhea, pneumonitis, liver, renal, thyroid or other endocrine dysfunction. He is expected to start the first dose of this treatment next week. The patient will come back for follow-up visit in 2 weeks for reevaluation and management of any adverse effect of his treatment. For the recurrent right pleural effusion, the patient will continue drainage via the Pleurx catheter. The patient and his family agreed to the current plan. For the lack of appetite and poor nutrition, I will refer the patient to the dietitian at the [place] for evaluation of his condition. For the difficulty swallowing, this could be secondary to extrinsic compression from the recurrent disease but I would refer the patient to gastroenterology for evaluation and consideration of upper endoscopy to rule out the presence of any esophageal mass or other abnormality. He was advised to call immediately if he has any concerning symptoms in the interval. The patient voices understanding of current disease status and treatment options and is in agreement with the current care plan.  All questions were  answered. The patient knows to call the clinic with any  problems, questions or concerns. We can certainly see the patient much sooner if necessary.  Disclaimer: This note was dictated with voice recognition software. Similar sounding words can inadvertently be transcribed and may not be corrected upon review.

## 2014-12-21 ENCOUNTER — Ambulatory Visit (HOSPITAL_BASED_OUTPATIENT_CLINIC_OR_DEPARTMENT_OTHER): Payer: BLUE CROSS/BLUE SHIELD

## 2014-12-21 ENCOUNTER — Telehealth: Payer: Self-pay | Admitting: *Deleted

## 2014-12-21 ENCOUNTER — Other Ambulatory Visit: Payer: Self-pay | Admitting: *Deleted

## 2014-12-21 DIAGNOSIS — E860 Dehydration: Secondary | ICD-10-CM

## 2014-12-21 DIAGNOSIS — C3491 Malignant neoplasm of unspecified part of right bronchus or lung: Secondary | ICD-10-CM | POA: Diagnosis not present

## 2014-12-21 MED ORDER — SODIUM CHLORIDE 0.9 % IV SOLN: 1000.0000 mL | Freq: Once | INTRAVENOUS | Status: DC

## 2014-12-21 MED ORDER — SODIUM CHLORIDE 0.9 % IV SOLN
1000.0000 mL | Freq: Once | INTRAVENOUS | Status: AC
  Administered 2014-12-21: 1000 mL via INTRAVENOUS

## 2014-12-21 NOTE — Telephone Encounter (Signed)
Pt's daughter, Vicente Males informed of MD's advisement.  I tried to call Patty @ [facility]- no answer/unable to leave vm.

## 2014-12-21 NOTE — Patient Instructions (Addendum)
????????????? ? ???????? (Dehydration, Adult) ????????????? - ??? ?????????, ??? ??????? ? ????????? ??????????? ??????????? ?????????? ???????? ??? ????. ??? ??????????, ????? ?? ??????????? ???????? ??????, ??? ???????. ???????? ?????? ??????, ????? ??? ?????, ???? ? ?????? ?? ????? ??????????????? ??? ??????????? ?????????? ????????. ????? ?????? ???????? ?????????? ????? ???????? ? ?????????????.  ?? ??????? ??????? ????????????? ????? ??????????? ?? ??????? ?? ????????. ??? ????????? ??????? ????? ?? ??????????????, ?? ???????? ??? ???????? ? ??????? ?????????????. ???????  ????????? ????? ????????? ????? ????:  ?????.  ??????.  ?????????? ?????????????, ????????, ? ???????? ?????? ? ??????? ??????.  ?????????? ??????????? ???????????? ?????????? ???????? ?? ????? ?????????? ???????? ??? ?? ????? ???????.  ?????????? ?????????????.  ????????? ??????????? ????.  ????? ????????? ????????????? ??????????. ??????? ????? ? ???????? ????? ????????? ????? ??????? ????????? ????:  ??????????? ????????? ????????? ??? ????????? ??????? ???????? ?? ????????? (?????????).   ?????????? ??????????? ????????????, ????????, ???????? ????????, ??? ??????? ????? ????????????? ?????????????.  ??????? ????.   ??????? ?? ??????? ???????.   ???????????? ?????? ??????, ?????????? ????????????.  ????????  ?????? ?????????????.  ?????.  ??????? ???.  ????????? ??????? ?? ???.  ????? ?????? ????. ????????????? ??????? ???????  ??????? ??????? ?? ???.   ???????? ??????.   ???? ??????? ????? ? ?????????? ?????????? ????.   ?????????? ????????? ??????? ????????.   ???????? ????.   ?????????????? ?????????, ????????, ????? ?? ??????? ?? ????????? ????.  ??????? ?????????????.  ????????? ????.   ???????? ? ?????? ????.   ???? ???? ?????? ???????? ? ?????????, ??? ?? ????? ??????????????.   ????????? ????????? ?????????.   ??????? ?????.   ???? ??? ????.    ?????????? ??????? ??? ????????? ??????????? ????, ????????, ? ?????? ??????.   ??????????? ??????????????.   ????????? ???????? ?????? ?????????.   ??????? ?????? ????? (? ????????? ???? ????? 100 ?????? ? ??????).   ????????? ???????.   ????????? ???????????? ????????.   ?????? ?????????.   ??????? ? ???????? ?????.   ??????????? ??? ? ???.   ??????????? ????????.  ??????? ??? ????????? ? ????????????.  ???????? (?????? ????????).   ????????????? ???????? ????.   ??????????????? ?????????. ???????  ??? ????????? ????? ???? ??????????????? ?? ??????? ?????????. ????? ????? ???? ????????? ????????????, ????? ?????????? ??????? ?????????????. ????? ???????????? ????? ????????:   ??????? ????.   ??????? ?????.  ???????  ??????? ????? ????????? ??????? ?? ??? ???????. ?????? ??? ??????? ??????? ????????????? ????? ????? ?????? ? ???????? ????????. ??????? ?????????? ???????? ????? ??. ?? ??????? ?????, ???? ????????????? ?????? ???????. ??????? ????????????? ?????????? ?????? ? ????????. ??????? ??????? ?????????????  ????? ???????? ?????????? ???? ??? ?????????? ?????????? ????????.   ?????????? ????????? ?????? ????????? ? ????? (????????????).  ??????? ????????????? ??????? ???????  ???????????? ???????? ??? ??????????? ???????????? (ORS). ??????? ???????? ?????????????  ???????????? ???????? ????????? (????? ??????????).   ???????? ???????? ???????????? ????? ??????????? ??????, ??????? ???????? ????? ??? ? ??????? (??????????????? ???? ??? NG ????).  ????????? ?????-???? ?????????? ?????????????? ???????. ?????????? ?? ????? ? ???????? ????????   ????? ?????, ????? ???? ???? ?????????? ??? ??????-??????? ?????.   ????? ???? ??? ???????? ????????, ??????? ????????. ?? ????? ?????? ??????????? ?????? ?????? ????.  ?????????? ???? ??? ???????, ??????? ???????? ???????????. ????????? ???????? ?????? ? ?????????? ???????.  ??????????  ?????? ??????????????? ????? ?????? ?????????????? ? ??????????? ?????????.   ??????????? ORS ???????? ??????????? ?????????????. ?????????? ORS ???????? ?????? 5 ????? ?? ??? ???, ???? ?????????? ???? ?? ???????? ? ?????.  ???? ? ??? ??????? ????? ??? ?????, ??? ????? ????????? ???? ????, ORS ??? ?? ? ??????.   ???? ? ??? ??????, ????????? ????????????:   ????????, ?????????? ??????.   ????????? ?????.   ??????.   ???????????? ????????.  ?? ??????? ????????? ????????, ?????????? ????. ??? ????? ???????? ? ????????? ??????????? ?????????? ?????? ? ????????? (??????????????).  ?????????? ? ?????, ????:  ?? ?? ?????? ???? ??? ???? ??? ??????????? ?????.  ? ??? ?????????? ????????? ?????? ? ??????? ?????  24 ?????.  ? ??? ?????????? ???????????. ?????????? ?????????? ? ?????, ????:   ? ??? ?????????? ??????? ?????.  ? ??? ??????? ??????.  ?? ?? ???????? ? ??????? 6-8 ?????, ??? ?????????? ???? ????????? ?????????? ????? ?????? ????.  ? ??? ?????????? ????.  ? ??? ?????????? ?????????????? ?/??? ??????????? ????????.   ??? ?????????? ?? ????? ???????? ??????, ??????????????? ????? ??????. ??????????? ???????? ????? ???????????? ??? ??????? ? ????? ??????? ??????.   Document Released: 05/26/2008 Document Revised: 09/28/2014 Elsevier Interactive Patient Education 2016 [place] injection ??? ???????????? ????? ??? ?????????? ????????????? ??????????? ??? ??????? ????????? ????? ???????? (???? ????) ? ????????????????? ???? ???????. ?? ??????????????? ???????????? ?? ?????????? ?????? ? ????????? ?? ????. ??? ????????? ????? ??????????? ? ?????? ?????; ???? ? ??? ????????? ???????, ?????????? ? ???????????? ????????? ??? ??????????. ??? ??? ??????? ?????????? ???????????? ????????? ?? ?????? ?????? ????? ?????????? ?????????? ?????, ???? ?? ? ??? ???? ?? ????????? ?????????: -??????; -???????????? ???????? ???????; -?????????????? ???????????  ?????????; -??????????? ??????; -??????????? ?????? ??? ????????? ???????; -????????; -????????? ??? ????????????? ??????? ?? ?????????????; -????????? ??? ????????????? ??????? ?? ?????? ?????????; -????????? ??? ????????????? ??????? ?? ??????? ????????, ????????? ??? ???????????; -???????????? ??? ???????????? ????????????; -????????? ??????; ??? ??? ??????? ????????? ??? ?????????? ??? ????????? ????????????? ??? ????????????? ????????. ??? ????????? ???????? ?????? ??? ?????? ??????????? ?????????? ? ???????? ??? ???????. ????? ??????? ??????? ??? ????? ????????????? ??????????? ?????????? ? ????????? (MedGuide). ?????? ??? ??????? ??????????? ???????? ??? ??????????. ???????? ???????? ? ??????????? ?????????? ????? ????????? ?????. ? ???? ?????? ?????????? ?????? ????????????. ?????????????: ???? ??? ???????, ??? ?? ????????? ?????????? ???? ????? ?????????, ?????????? ?????????? ? ????????????????? ????? ??? ???????? ????????? ??? ???????? ?????????? ??????. ??????????: ??? ????????? ????????????? ?????? ??? ???. ?? ???????? ?? ? ??????? ??????. ??? ?????????? ? ?????? ???????? ?????? ?????????? ????? ????????? ????????? ?????????. ???????? ????? ??? ??????? ???????????? ?????????, ???? ?? ?? ?????? ??????? ?? ????? ? ??????????? ?????. ? ??? ??? ????????? ????? ???????? ?? ??????????????? ?????????????? ?? ?????????. ???????????? ????? ??? ??????? ???????????? ????????? ?????? ???? ??????????? ???? ????????, ????????????? ????????, ?????????????? ?????????? ??? ??????? ???????. ????? ???????? ??? ? ???????, ???????????? ??????????? ???????? ??? ??????????. ????????? ???????? ????? ???????? ?? ?????????????? ? ???? ??????????. ???? ???????? ?? ???????? ???? ????????? ??????????????. ???????????? ???????????? ????????? ?????? ???? ??????????? ???? ????????, ????????????? ????????, ?????????????? ?????????? ? ??????? ???????. ????? ???????? ??? ? ???????, ???????????? ??????????? ???????? ???  ??????????. ????????? ???????? ????? ???????? ?? ?????????????? ? ??????????? ???? ??????????. ?? ??? ????? ???????? ???????? ??? ????????????? ????? ?????????? ?? ????? ?????? ????? ????????? ?? ?????? ??? ?????????? ??????????? ?????. ?? ????? ?????? ????? ????????? ??? ??????? ????????? ??????? ??????? ?????. ????????? ???????????? ?? ????? ?????? ????? ????????? ? ? ??????? 4 ??????? ????? ??? ???????????. ???????? ??????? ???????? ????? ? ???????????? ???????????? ??? ????????????? ? ????????? ????????????. ?????????? ??????????? ???????? ????? ????????? ?? ????. ?? ?????????????? ??????????? ?????????? ? ?????, ??????? ???????????? ????????? ??? ??????????. ?? ????? ?????? ????? ????????? ? ? ??????? 4 ??????? ????? ?????? ????????? ???? ?????? ??????? ??????? ??????. ????? ???????? ??????? ????? ???? ??? ?????? ????? ?????????? ???????? ???????, ? ??????? ??? ??????? ??? ????? ?????? ???????? ????? ??? ??????? ???????????? ?????????: -????????????? ???????, ????? ??? ?????? ????, ???, ??????????, ????????? ????, ??? ??? ?????; -??? ? ???????? ????? ??? ??????, ????????????? ???; -??????????? ???????; -????????? ?????????? ?????????? ????; -????????? ??????; -???????????; -?????; -?????? ???? ????; -??????????????, ?????????????? ?????????, ???????; -?????????? ??? ???????; -????????? ???????????; -??????? ????; -?????????; -???????? ????; -???????? ????????; -?????????? ???????? ????; -??????????? ???????? ??????????? ?????? ?????? ?????, ???????? ??????????????, ??????? ?? ???, ??????? ????, ????????? ????? ??? ???, ???????, ???? ? ??????, ???????? ???????? ?????? ??? ?????, ?????? ?????? ? ??????????????; -??????????? ???????? ??????????? ??????, ???????? ?????? ????, ?????????????? ????, ?????? ????????, ???????, ???? ? ?????? ??????????, ?????????? ?????? ???? ??? ????; -???? ? ???????; -???????? ????? ????; ???????? ???????, ??????? ?????? ?? ??????? ???????????? ????????????  ???????? (???????? ????? ??? ??????? ???????????? ?????????, ???? ??? ???????????? ??? ?????????? ??????????): ?????; -??????; -??????; -???? ? ????????; -??????? ?????????; ???? ???????? ?? ???????? ???? ????????? ???????? ????????. ?????????? ? ????? ?? ??????? ???????????? ???????? ????????. ?? ?????? ???????? ? ???????? ???????? ?  FDA ?? ???????? [phone]. ??? ??????? ??????? ??? ?????????? ??? ????????? ???????? ?????? ??? ?????? ??????????? ?????????? ? ???????? ??? ???????. ??? ????????? ?? ???????? ??? ???????? ? ???????? ????????. ??????????: ???? ???????? ?? ???????? ???? ????????? ????????. ???? ? ??? ????????? ???????, ?????????? ??????? ?????????, ?????????? ? ?????, ?????????? ??? ??????? ???????????? ?????????.    2016, Elsevier/Gold Standard. (2013-12-09 00:00:00)

## 2014-12-21 NOTE — Progress Notes (Unsigned)
POF for IVF appt entered. Pt recently seen on 11/29 by MD.   Pt has an appt for GI 12/8

## 2014-12-21 NOTE — Telephone Encounter (Signed)
Returned call to [employer] pt will have IVF appt only as he was seen by provider yesterday. MD has made a referral to Nutritionist, and GI ( appt 12/8 )  Request call back to confirm message was received or if there are any concerns.  Cal lplaced to [facility], nutritionist requesting if she would be able to see pt while he is in infusion today.  Barb advised she was unable to see pt today,  however she has made him an appt for 12/6

## 2014-12-22 ENCOUNTER — Telehealth: Payer: Self-pay | Admitting: Internal Medicine

## 2014-12-22 ENCOUNTER — Other Ambulatory Visit: Payer: Self-pay | Admitting: Nurse Practitioner

## 2014-12-22 ENCOUNTER — Inpatient Hospital Stay (HOSPITAL_COMMUNITY)
Admission: AD | Admit: 2014-12-22 | Discharge: 2014-12-26 | DRG: 180 | Disposition: A | Discharge status: Home Health | Payer: BLUE CROSS/BLUE SHIELD | Source: Ambulatory Visit | Attending: Internal Medicine | Admitting: Internal Medicine

## 2014-12-22 ENCOUNTER — Other Ambulatory Visit: Payer: Self-pay | Admitting: *Deleted

## 2014-12-22 ENCOUNTER — Encounter (HOSPITAL_COMMUNITY): Payer: Self-pay

## 2014-12-22 ENCOUNTER — Telehealth: Payer: Self-pay | Admitting: Gastroenterology

## 2014-12-22 ENCOUNTER — Ambulatory Visit: Payer: BLUE CROSS/BLUE SHIELD | Admitting: Nurse Practitioner

## 2014-12-22 ENCOUNTER — Inpatient Hospital Stay (HOSPITAL_COMMUNITY): Payer: BLUE CROSS/BLUE SHIELD

## 2014-12-22 VITALS — BP 137/68 | HR 113 | Temp 97.7°F | Resp 18

## 2014-12-22 DIAGNOSIS — J90 Pleural effusion, not elsewhere classified: Secondary | ICD-10-CM | POA: Diagnosis not present

## 2014-12-22 DIAGNOSIS — J9601 Acute respiratory failure with hypoxia: Secondary | ICD-10-CM | POA: Diagnosis present

## 2014-12-22 DIAGNOSIS — E876 Hypokalemia: Secondary | ICD-10-CM | POA: Diagnosis present

## 2014-12-22 DIAGNOSIS — I2510 Atherosclerotic heart disease of native coronary artery without angina pectoris: Secondary | ICD-10-CM | POA: Diagnosis present

## 2014-12-22 DIAGNOSIS — C771 Secondary and unspecified malignant neoplasm of intrathoracic lymph nodes: Secondary | ICD-10-CM | POA: Diagnosis present

## 2014-12-22 DIAGNOSIS — R1319 Other dysphagia: Secondary | ICD-10-CM | POA: Diagnosis present

## 2014-12-22 DIAGNOSIS — I119 Hypertensive heart disease without heart failure: Secondary | ICD-10-CM | POA: Diagnosis present

## 2014-12-22 DIAGNOSIS — R948 Abnormal results of function studies of other organs and systems: Secondary | ICD-10-CM | POA: Diagnosis present

## 2014-12-22 DIAGNOSIS — Z510 Encounter for antineoplastic radiation therapy: Secondary | ICD-10-CM | POA: Diagnosis present

## 2014-12-22 DIAGNOSIS — E860 Dehydration: Secondary | ICD-10-CM | POA: Diagnosis present

## 2014-12-22 DIAGNOSIS — Z9221 Personal history of antineoplastic chemotherapy: Secondary | ICD-10-CM

## 2014-12-22 DIAGNOSIS — T462X5A Adverse effect of other antidysrhythmic drugs, initial encounter: Secondary | ICD-10-CM | POA: Diagnosis present

## 2014-12-22 DIAGNOSIS — R590 Localized enlarged lymph nodes: Secondary | ICD-10-CM | POA: Diagnosis present

## 2014-12-22 DIAGNOSIS — I4891 Unspecified atrial fibrillation: Secondary | ICD-10-CM | POA: Diagnosis present

## 2014-12-22 DIAGNOSIS — Z902 Acquired absence of lung [part of]: Secondary | ICD-10-CM

## 2014-12-22 DIAGNOSIS — Z87891 Personal history of nicotine dependence: Secondary | ICD-10-CM | POA: Diagnosis not present

## 2014-12-22 DIAGNOSIS — C772 Secondary and unspecified malignant neoplasm of intra-abdominal lymph nodes: Secondary | ICD-10-CM | POA: Diagnosis present

## 2014-12-22 DIAGNOSIS — R1310 Dysphagia, unspecified: Secondary | ICD-10-CM | POA: Diagnosis present

## 2014-12-22 DIAGNOSIS — R0602 Shortness of breath: Secondary | ICD-10-CM

## 2014-12-22 DIAGNOSIS — J910 Malignant pleural effusion: Secondary | ICD-10-CM | POA: Diagnosis present

## 2014-12-22 DIAGNOSIS — E032 Hypothyroidism due to medicaments and other exogenous substances: Secondary | ICD-10-CM | POA: Diagnosis present

## 2014-12-22 DIAGNOSIS — R942 Abnormal results of pulmonary function studies: Secondary | ICD-10-CM | POA: Diagnosis not present

## 2014-12-22 DIAGNOSIS — I723 Aneurysm of iliac artery: Secondary | ICD-10-CM | POA: Diagnosis present

## 2014-12-22 DIAGNOSIS — Z9981 Dependence on supplemental oxygen: Secondary | ICD-10-CM

## 2014-12-22 DIAGNOSIS — I10 Essential (primary) hypertension: Secondary | ICD-10-CM | POA: Diagnosis present

## 2014-12-22 DIAGNOSIS — T462X1A Poisoning by other antidysrhythmic drugs, accidental (unintentional), initial encounter: Secondary | ICD-10-CM

## 2014-12-22 DIAGNOSIS — R0600 Dyspnea, unspecified: Secondary | ICD-10-CM | POA: Diagnosis present

## 2014-12-22 DIAGNOSIS — Z85118 Personal history of other malignant neoplasm of bronchus and lung: Secondary | ICD-10-CM

## 2014-12-22 DIAGNOSIS — K222 Esophageal obstruction: Secondary | ICD-10-CM | POA: Diagnosis present

## 2014-12-22 DIAGNOSIS — E46 Unspecified protein-calorie malnutrition: Secondary | ICD-10-CM

## 2014-12-22 DIAGNOSIS — C7972 Secondary malignant neoplasm of left adrenal gland: Secondary | ICD-10-CM | POA: Diagnosis present

## 2014-12-22 DIAGNOSIS — C3491 Malignant neoplasm of unspecified part of right bronchus or lung: Secondary | ICD-10-CM

## 2014-12-22 DIAGNOSIS — E43 Unspecified severe protein-calorie malnutrition: Secondary | ICD-10-CM | POA: Diagnosis present

## 2014-12-22 DIAGNOSIS — R634 Abnormal weight loss: Secondary | ICD-10-CM | POA: Diagnosis not present

## 2014-12-22 LAB — CBC

## 2014-12-22 LAB — COMPREHENSIVE METABOLIC PANEL
ALK PHOS: 55 U/L (ref 38–126)
ALT: 18 U/L (ref 17–63)
AST: 20 U/L (ref 15–41)
Albumin: 2.9 g/dL — ABNORMAL LOW (ref 3.5–5.0)
Anion gap: 11 (ref 5–15)
BUN: 14 mg/dL (ref 6–20)
CALCIUM: 8.8 mg/dL — AB (ref 8.9–10.3)
CHLORIDE: 98 mmol/L — AB (ref 101–111)
CO2: 27 mmol/L (ref 22–32)
CREATININE: 0.96 mg/dL (ref 0.61–1.24)
GFR calc Af Amer: >60 mL/min (ref >60)
Glucose, Bld: 80 mg/dL (ref 65–99)
Potassium: 3.4 mmol/L — ABNORMAL LOW (ref 3.5–5.1)
Sodium: 136 mmol/L (ref 135–145)
Total Bilirubin: 1.1 mg/dL (ref 0.3–1.2)
Total Protein: 6.2 g/dL — ABNORMAL LOW (ref 6.5–8.1)

## 2014-12-22 LAB — CBC WITH DIFFERENTIAL/PLATELET
BASOS ABS: 0 10*3/uL (ref 0.0–0.1)
Basophils Relative: 0 %
EOS PCT: 3 %
Eosinophils Absolute: 0.3 10*3/uL (ref 0.0–0.7)
HEMATOCRIT: 35.9 % — AB (ref 39.0–52.0)
HEMOGLOBIN: 11.9 g/dL — AB (ref 13.0–17.0)
LYMPHS ABS: 1.6 10*3/uL (ref 0.7–4.0)
LYMPHS PCT: 19 %
MCH: 30.2 pg (ref 26.0–34.0)
MCHC: 33.1 g/dL (ref 30.0–36.0)
MCV: 91.1 fL (ref 78.0–100.0)
Monocytes Absolute: 0.7 10*3/uL (ref 0.1–1.0)
Monocytes Relative: 9 %
NEUTROS ABS: 5.6 10*3/uL (ref 1.7–7.7)
NEUTROS PCT: 69 %
PLATELETS: 269 10*3/uL (ref 150–400)
RBC: 3.94 MIL/uL — AB (ref 4.22–5.81)
RDW: 13.6 % (ref 11.5–15.5)
WBC: 8.1 10*3/uL (ref 4.0–10.5)

## 2014-12-22 LAB — BRAIN NATRIURETIC PEPTIDE: B NATRIURETIC PEPTIDE 5: 82.8 pg/mL (ref 0.0–100.0)

## 2014-12-22 LAB — CREATININE, SERUM
Creatinine, Ser: 1.02 mg/dL (ref 0.61–1.24)
GFR calc Af Amer: >60 mL/min (ref >60)
GFR calc non Af Amer: >60 mL/min (ref >60)

## 2014-12-22 MED ORDER — TOBRAMYCIN-DEXAMETHASONE 0.3-0.1 % OP SUSP
2.0000 [drp] | OPHTHALMIC | Status: DC
  Administered 2014-12-22 – 2014-12-26 (×16): 2 [drp] via OPHTHALMIC
  Filled 2014-12-22: qty 2.5

## 2014-12-22 MED ORDER — METOPROLOL TARTRATE 25 MG PO TABS: 25.0000 mg | ORAL_TABLET | Freq: Two times a day (BID) | ORAL | Status: DC

## 2014-12-22 MED ORDER — ONE-DAILY MULTI VITAMINS PO TABS: 1.0000 | ORAL_TABLET | Freq: Every day | ORAL | Status: DC

## 2014-12-22 MED ORDER — METHYLPREDNISOLONE SODIUM SUCC 125 MG IJ SOLR
60.0000 mg | Freq: Three times a day (TID) | INTRAMUSCULAR | Status: DC
  Administered 2014-12-22 – 2014-12-24 (×5): 60 mg via INTRAVENOUS
  Filled 2014-12-22 (×5): qty 2

## 2014-12-22 MED ORDER — UMECLIDINIUM-VILANTEROL 62.5-25 MCG/INH IN AEPB
1.0000 | INHALATION_SPRAY | Freq: Every day | RESPIRATORY_TRACT | Status: DC
  Administered 2014-12-23: 1 via RESPIRATORY_TRACT

## 2014-12-22 MED ORDER — SODIUM CHLORIDE 0.9 % IV SOLN
1000.0000 mL | INTRAVENOUS | Status: AC
  Administered 2014-12-22: 09:00:00 via INTRAVENOUS

## 2014-12-22 MED ORDER — ALBUTEROL SULFATE (2.5 MG/3ML) 0.083% IN NEBU: 2.5000 mg | INHALATION_SOLUTION | RESPIRATORY_TRACT | Status: DC | PRN

## 2014-12-22 MED ORDER — PANTOPRAZOLE SODIUM 40 MG IV SOLR
40.0000 mg | Freq: Two times a day (BID) | INTRAVENOUS | Status: DC
  Administered 2014-12-23 – 2014-12-24 (×3): 40 mg via INTRAVENOUS
  Filled 2014-12-22 (×4): qty 40

## 2014-12-22 MED ORDER — ESCITALOPRAM OXALATE 10 MG PO TABS: 10.0000 mg | ORAL_TABLET | Freq: Every day | ORAL | Status: DC

## 2014-12-22 MED ORDER — FUROSEMIDE 10 MG/ML IJ SOLN
40.0000 mg | Freq: Once | INTRAMUSCULAR | Status: AC
  Administered 2014-12-22: 40 mg via INTRAVENOUS
  Filled 2014-12-22: qty 4

## 2014-12-22 MED ORDER — ACETAMINOPHEN 325 MG PO TABS: 650.0000 mg | ORAL_TABLET | Freq: Four times a day (QID) | ORAL | Status: DC | PRN

## 2014-12-22 MED ORDER — OXYCODONE HCL 5 MG PO TABS: 5.0000 mg | ORAL_TABLET | Freq: Four times a day (QID) | ORAL | Status: DC | PRN

## 2014-12-22 MED ORDER — LEVOTHYROXINE SODIUM 100 MCG IV SOLR
12.5000 ug | Freq: Every day | INTRAVENOUS | Status: DC
  Administered 2014-12-22 – 2014-12-24 (×3): 12.5 ug via INTRAVENOUS
  Filled 2014-12-22 (×3): qty 5

## 2014-12-22 MED ORDER — ENOXAPARIN SODIUM 40 MG/0.4ML ~~LOC~~ SOLN
40.0000 mg | SUBCUTANEOUS | Status: DC
  Administered 2014-12-22 – 2014-12-25 (×4): 40 mg via SUBCUTANEOUS
  Filled 2014-12-22 (×4): qty 0.4

## 2014-12-22 MED ORDER — GUAIFENESIN-DM 100-10 MG/5ML PO SYRP: 5.0000 mL | ORAL_SOLUTION | ORAL | Status: DC | PRN

## 2014-12-22 MED ORDER — TRIAMTERENE-HCTZ 37.5-25 MG PO TABS
1.0000 | ORAL_TABLET | Freq: Every morning | ORAL | Status: DC
  Administered 2014-12-23 – 2014-12-26 (×4): 1 via ORAL
  Filled 2014-12-22 (×4): qty 1

## 2014-12-22 MED ORDER — LEVOTHYROXINE SODIUM 25 MCG PO TABS: 25.0000 ug | ORAL_TABLET | Freq: Every day | ORAL | Status: DC

## 2014-12-22 MED ORDER — ONDANSETRON HCL 4 MG PO TABS: 4.0000 mg | ORAL_TABLET | Freq: Four times a day (QID) | ORAL | Status: DC | PRN

## 2014-12-22 MED ORDER — ONDANSETRON HCL 4 MG/2ML IJ SOLN: 4.0000 mg | Freq: Four times a day (QID) | INTRAMUSCULAR | Status: DC | PRN

## 2014-12-22 MED ORDER — ALBUTEROL SULFATE (2.5 MG/3ML) 0.083% IN NEBU
2.5000 mg | INHALATION_SOLUTION | Freq: Four times a day (QID) | RESPIRATORY_TRACT | Status: DC
  Administered 2014-12-22: 2.5 mg via RESPIRATORY_TRACT
  Filled 2014-12-22: qty 3

## 2014-12-22 MED ORDER — TRAMADOL HCL 50 MG PO TABS
50.0000 mg | ORAL_TABLET | Freq: Once | ORAL | Status: AC
  Administered 2014-12-22: 50 mg via ORAL
  Filled 2014-12-22: qty 1

## 2014-12-22 MED ORDER — LEVOFLOXACIN IN D5W 750 MG/150ML IV SOLN
750.0000 mg | INTRAVENOUS | Status: DC
  Administered 2014-12-22 – 2014-12-25 (×3): 750 mg via INTRAVENOUS
  Filled 2014-12-22 (×3): qty 150

## 2014-12-22 MED ORDER — FLUTICASONE PROPIONATE 50 MCG/ACT NA SUSP
2.0000 | Freq: Every day | NASAL | Status: DC
  Administered 2014-12-22 – 2014-12-26 (×5): 2 via NASAL
  Filled 2014-12-22: qty 16

## 2014-12-22 MED ORDER — ACETAMINOPHEN 650 MG RE SUPP: 650.0000 mg | Freq: Four times a day (QID) | RECTAL | Status: DC | PRN

## 2014-12-22 MED ORDER — ALBUTEROL SULFATE (2.5 MG/3ML) 0.083% IN NEBU
2.5000 mg | INHALATION_SOLUTION | Freq: Three times a day (TID) | RESPIRATORY_TRACT | Status: DC
  Administered 2014-12-23 – 2014-12-26 (×9): 2.5 mg via RESPIRATORY_TRACT
  Filled 2014-12-22 (×9): qty 3

## 2014-12-22 MED ORDER — MORPHINE SULFATE (PF) 2 MG/ML IV SOLN
1.0000 mg | INTRAVENOUS | Status: DC | PRN
  Administered 2014-12-23 – 2014-12-24 (×2): 1 mg via INTRAVENOUS
  Filled 2014-12-22 (×2): qty 1

## 2014-12-22 NOTE — H&P (Signed)
[facility] History and Physical  Travonte Byard GDJ:[id number] DOB: 14-Oct-1940 DOA: 12/22/2014  Referring physician: Dr Julien Nordmann PCP: [name], MD   Chief Complaint:  Dysphagia and shortness of breath x 1 day   History provided by daughter [name] on the phone   HPI:  74 year old [place] male with history of Recurrent non-small cell lung cancer, adenocarcinoma with negative EGFR mutation and negative ALK gene translocation initially diagnosed as Stage IB (T2a, N0, M0) non-small cell lung cancer consistent with invasive adenocarcinoma  with visceral pleural invasion diagnosed in August 2015. He underwent left lower lobectomy with lymph node sampling in August 2015, followed by adjuvant systemic chemotherapy with carboplatin for AUC of 5 and Alimta 500 MG/M2 every 3 weeks status post 4 cycles. Patient was admitted 2 weeks back for right-sided pleural effusion and underwent right breast catheter placement by Dr. Roxan Hockey on 12/07/2014. Patient was discharged home with plan on starting Ketruda (pembrolizumab) 200 mg IV every 3 weeks.( First dose scheduled for 12/27/2014.) Other medical problem include anxiety, hypertension, depression, hypothyroidism. Patient was seen by his oncologist Dr. Earlie Server on 11/29 when he visited for a follow-up. Patient reported feeling short of breath on exertion. He was draining fluid off his list catheter off about 300 mL every other day. As per his daughter for the past 2 weeks patient was having progressive dysphagia both to solid and liquid. This has significantly progressed over the past 1 week and for the last 2 days he has not been able to swallow anything and has been regurgitating his food or medication. Patient recently had an MRI of the brain which was negative for metastatic disease. He also had a PET scan which showed marked progression of disease with widespread bilateral mediastinal and hilar lymphadenopathy, right cervical lymphadenopathy  and malignant right pleural effusion with possible lymphangitic spread of tumor throughout the right lung, left adrenal metastases and retroperitoneal lymph node. Also had focal thickening of the mid esophagus concerning for esophagitis versus extrinsic compression. Daughter called Dr. Julien Nordmann this morning and patient was referred for direct admission to hospitalist service. Patient denies headache, dizziness, fever, chills, , chest pain, palpitations,  abdominal pain, bowel or urinary symptoms. Has poor appetite   Review of Systems:  As outlined in history of present illness. Review of systems Limited due to patient not able to express properly on the phone and being hard of hearing.   Past Medical History  Diagnosis Date  . Anxiety   . Hypertension   . Depression   . Squamous carcinoma (HCC)     "face & scale"  . Non-small cell carcinoma of left lung, stage 1 ([place]) 08/2013    postoperative chemotherapy  . Hypothyroidism   . Pleural effusion     [facility] 11/29/2014  . Dysrhythmia     after OR - resolved   Past Surgical History  Procedure Laterality Date  . Appendectomy    . Video assisted thoracoscopy (vats)/wedge resection Left 09/10/2013    Procedure: LEFT VIDEO ASSISTED THORACOSCOPY ,WEDGE RESECTION LEFT LOWER LOBE LUNG,LEFT LOWER LOBECTOMY, & NODE SAMPLING ;  Surgeon: Melrose Nakayama, MD;  Location: [place];  Service: Thoracic;  Laterality: Left;  . Chest tube insertion Right 12/07/2014    Procedure: INSERTION PLEURAL DRAINAGE CATHETER;  Surgeon: Melrose Nakayama, MD;  Location: [place];  Service: Thoracic;  Laterality: Right;   Social History:  reports that he quit smoking about 16 years ago. His smoking use included Cigarettes. He has a 16 pack-year smoking history.  He has never used smokeless tobacco. He reports that he drinks about 1.8 oz of alcohol per week. He reports that he does not use illicit drugs.  No Known Allergies  Family History  Problem Relation Age of Onset   . Lung cancer Father 43    smoked  . Stomach cancer Mother     Prior to Admission medications   Medication Sig Start Date End Date Taking? Authorizing Provider  escitalopram (LEXAPRO) 10 MG tablet Take 1 tablet (10 mg total) by mouth daily. 07/26/14   Aleksei Plotnikov V, MD  fluticasone (FLONASE) 50 MCG/ACT nasal spray Place 2 sprays into both nostrils daily. 12/07/13   Minette Headland, NP  levothyroxine (LEVOTHROID) 25 MCG tablet Take 1 tablet (25 mcg total) by mouth daily. 03/11/14   Aleksei Plotnikov V, MD  metoprolol tartrate (LOPRESSOR) 25 MG tablet Take 1 tablet (25 mg total) by mouth 2 (two) times daily. 03/11/14   Aleksei Plotnikov V, MD  Multiple Vitamin (MULTIVITAMIN) tablet Take 1 tablet by mouth daily.    Historical Provider, MD  ondansetron (ZOFRAN) 8 MG tablet Take 0.5 tablets (4 mg total) by mouth every 8 (eight) hours as needed for nausea or vomiting. Patient not taking: Reported on 12/20/2014 12/09/14   Curt Bears, MD  oxyCODONE (OXY IR/ROXICODONE) 5 MG immediate release tablet Take 1-2 tablets (5-10 mg total) by mouth every 6 (six) hours as needed for moderate pain or severe pain. 12/20/14   Curt Bears, MD  tobramycin-dexamethasone [facility]) ophthalmic solution Place 2 drops into both eyes every 4 (four) hours while awake. 12/07/13   Minette Headland, NP  triamterene-hydrochlorothiazide (MAXZIDE-25) 37.5-25 MG per tablet TAKE ONE TABLET BY MOUTH EVERY MORNING 07/26/14   Cassandria Anger, MD  Umeclidinium-Vilanterol (ANORO ELLIPTA) 62.5-25 MCG/INH AEPB Inhale 1 Act into the lungs daily. 11/28/14   Cassandria Anger, MD     Physical Exam:  There were no vitals filed for this visit.  Constitutional: Vital signs reviewed.  Elderly male lying in bed actively wheezy, not in acute distress HEENT: Pallor present, no icterus, moist oral mucosa, no cervical lymphadenopathy, no JVD Cardiovascular: RRR, S1 normal, S2 normal, no MRG Chest: Diffuse wheezing  bilaterally, diminished right-sided breath sounds, right-sided Pleurx catheter Abdominal: Soft. Non-tender, non-distended, bowel sounds are normal, Ext: warm, no edema Neurological: Awake and alert, nonfocal  Labs on Admission:  Basic Metabolic Panel:  Recent Labs Lab 12/20/14 1102  NA 135*  K 3.9  CO2 29  GLUCOSE 98  BUN 14.5  CREATININE 0.9  CALCIUM 9.6   Liver Function Tests:  Recent Labs Lab 12/20/14 1102  AST 16  ALT 17  ALKPHOS 65  BILITOT 0.54  PROT 6.5  ALBUMIN 2.7*   No results for input(s): LIPASE, AMYLASE in the last 168 hours. No results for input(s): AMMONIA in the last 168 hours. CBC:  Recent Labs Lab 12/20/14 1102  WBC 10.7*  NEUTROABS 8.1*  HGB 12.2*  HCT 36.5*  MCV 90.5  PLT 336   Cardiac Enzymes: No results for input(s): CKTOTAL, CKMB, CKMBINDEX, TROPONINI in the last 168 hours. BNP: Invalid input(s): POCBNP CBG:  Recent Labs Lab 12/19/14 1356  GLUCAP 82    Radiological Exams on Admission: No results found.  EKG: None  Assessment/Plan Principal Problem:   Dysphagia Possibly due to esophagitis versus extrinsic esophageal compression as seen on recent PET scan with mid esophageal thickening. -Keep strict nothing by mouth. IV Protonix twice a day. [facility] had been consulted. Patient had  been scheduled for outpatient evaluation on 12/8. -Supportive care with antiemetics as needed.  Active Problems: Dyspnea with Acute hypoxic respiratory failure Possibly associated with progressive lung cancer versus pulmonary edema. Patient actively wheezy with diminished breath sounds and trace leg edema. Check chest x-ray and BNP. Would place him on IV Solu-Medrol and scheduled albuterol nebs. His chest x-ray shows significant fluid overload  will have his progress catheter drained today. I will order her dose of IV Lasix as well. -In control with when necessary morphine.  Essential hypertension Blood pressure medications. Place him on  when necessary hydralazine.  History of a flutter As per daughter this was transient after his lobectomy. Patient is not on anticoagulation. He is currently in sinus rhythm.     Non-small cell carcinoma of right lung, stage 4 ([place]) This is progressed with widespread metastases. Dr. Julien Nordmann as discussed options for hospice versus immunotherapy with the patient and patient inclined towards starting immunotherapy. Scheduled to be started on 12/27/2014. Dr. Salina April and will follow patient while in the hospital  Protein calorie malnutrition  Hypothyroidism Patient currently nothing by mouth. place him on IV Synthroid.    Diet: Nothing by mouth  DVT prophylaxis: sq lovenox   Code Status: full code Family Communication: None at bedside. Spoke with daughter Elray Mcgregor on the phone. She will be coming to [place] from [place] this evening. Disposition Plan: Admit to inpatient.  Louellen Molder [facility] Pager [phone]  Total time spent on admission :50 minutes  If 7PM-7AM, please contact night-coverage [email or web address] Password [facility] 12/22/2014, 3:19 PM

## 2014-12-22 NOTE — Progress Notes (Signed)
ANTIBIOTIC CONSULT NOTE - INITIAL  Pharmacy Consult for levofloxacin Indication: pneumonia  No Known Allergies  Patient Measurements:   Adjusted Body Weight:   Vital Signs: Temp: 97.7 F (36.5 C) (12/01 0800) Temp Source: Oral (12/01 0800) BP: 137/68 mmHg (12/01 0800) Pulse Rate: 113 (12/01 0800) Intake/Output from previous day:   Intake/Output from this shift:    Labs:  Recent Labs  12/20/14 1102 12/22/14 1025  WBC 85.2* DUPLICATE REQUEST  8.1  HGB 77.8* DUPLICATE REQUEST  24.2*  PLT 353 DUPLICATE REQUEST  614  CREATININE 0.9 1.02   Estimated Creatinine Clearance: 64 mL/min (by C-G formula based on Cr of 1.02). No results for input(s): VANCOTROUGH, VANCOPEAK, VANCORANDOM, GENTTROUGH, GENTPEAK, GENTRANDOM, TOBRATROUGH, TOBRAPEAK, TOBRARND, AMIKACINPEAK, AMIKACINTROU, AMIKACIN in the last 72 hours.   Microbiology: Recent Results (from the past 720 hour(s))  Culture, body fluid-bottle     Status: None   Collection Time: 11/30/14  3:17 PM  Result Value Ref Range Status   Specimen Description FLUID RIGHT PLEURAL  Final   Special Requests BOTTLES DRAWN AEROBIC AND ANAEROBIC 10CC  Final   Culture NO GROWTH 5 DAYS  Final   Report Status 12/05/2014 FINAL  Final  Gram stain     Status: None   Collection Time: 11/30/14  3:17 PM  Result Value Ref Range Status   Specimen Description FLUID RIGHT PLEURAL  Final   Special Requests NONE  Final   Gram Stain   Final    FEW WBC PRESENT, PREDOMINANTLY MONONUCLEAR NO ORGANISMS SEEN    Report Status 11/30/2014 FINAL  Final  Surgical pcr screen     Status: None   Collection Time: 12/07/14 11:10 AM  Result Value Ref Range Status   MRSA, PCR NEGATIVE NEGATIVE Final   Staphylococcus aureus NEGATIVE NEGATIVE Final    Comment:        The Xpert SA Assay (FDA approved for NASAL specimens in patients over 9 years of age), is one component of a comprehensive surveillance program.  Test performance has been validated by  [facility] for patients greater than or equal to 21 year old. It is not intended to diagnose infection nor to guide or monitor treatment.     Medical History: Past Medical History  Diagnosis Date  . Anxiety   . Hypertension   . Depression   . Squamous carcinoma (HCC)     "face & scale"  . Non-small cell carcinoma of left lung, stage 1 ([place]) 08/2013    postoperative chemotherapy  . Hypothyroidism   . Pleural effusion     [facility] 11/29/2014  . Dysrhythmia     after OR - resolved    Assessment: 74 YOM with lung cancer presents with dysphagia.  Pharmacy asked to dose levofloxacin for CAP.  12/1 >> levofloxacin  >>  No culture data  Dose changes/levels:  Goal of Therapy:  Dose for indication and for patient-specific parameters  Plan:   Based on CrCl, levofloxacin '750mg'$  IV q24h  Follow renal function  Doreene Eland, PharmD, BCPS.   Pager: [phone] 12/22/2014 5:05 PM

## 2014-12-22 NOTE — Consult Note (Signed)
Referring Provider: Dr. Clementeen Graham, [facility] Primary Care Physician:  [name], MD Primary Gastroenterologist:  None, unassigned with [place] primary as well  Reason for Consultation:  Dysphagia  HPI: Brandon Newton is a 74 y.o. male with recurrent non-small cell lung cancer.  Admitted today for SOB and dysphagia.  PET scan just 3 days ago shows the following:   IMPRESSION: 1. Today's study demonstrates marked progression of disease with widespread bilateral mediastinal and hilar lymphadenopathy, right cervical lymphadenopathy, malignant right pleural effusion, evidence of probable lymphangitic spread of tumor throughout the right lung, left adrenal metastasis and some nonenlarged but hypermetabolic upper abdominal and retroperitoneal lymph nodes which are presumably metastatic. 2. Focal thickening and hypermetabolism of the mid esophagus. This may reflect esophagitis, but the possibility of concurrent esophageal neoplasm is not excluded. This could be further evaluated with endoscopy if of clinical concern. 3. Extensive atherosclerosis, including left main and 3 vessel coronary artery disease, in addition to bilateral common iliac artery aneurysms, as above. 4. Right-sided PleurX catheter in position appears appropriately located. Post procedural changes of recent right-sided talc pleurodesis, as above.  Dr. Earlie Server wanted patient evaluated by GI for possible EGD.    Patient apparently has progressive difficulty swallowing over the past 2 weeks but mostly worse over the past 3-4 days.  Says that he can take small sips of liquids but anything more than that will come back up.  His daughter is an anesthesiologist in [place] and is on her way here now; should be here this evening.   Past Medical History  Diagnosis Date  . Anxiety   . Hypertension   . Depression   . Squamous carcinoma (HCC)     "face & scale"  . Non-small cell carcinoma of left lung, stage 1 ([place]) 08/2013   postoperative chemotherapy  . Hypothyroidism   . Pleural effusion     Archie Endo 11/29/2014  . Dysrhythmia     after OR - resolved    Past Surgical History  Procedure Laterality Date  . Appendectomy    . Video assisted thoracoscopy (vats)/wedge resection Left 09/10/2013    Procedure: LEFT VIDEO ASSISTED THORACOSCOPY ,WEDGE RESECTION LEFT LOWER LOBE LUNG,LEFT LOWER LOBECTOMY, & NODE SAMPLING ;  Surgeon: Melrose Nakayama, MD;  Location: [place];  Service: Thoracic;  Laterality: Left;  . Chest tube insertion Right 12/07/2014    Procedure: INSERTION PLEURAL DRAINAGE CATHETER;  Surgeon: Melrose Nakayama, MD;  Location: [place];  Service: Thoracic;  Laterality: Right;    Prior to Admission medications   Medication Sig Start Date End Date Taking? Authorizing Provider  escitalopram (LEXAPRO) 10 MG tablet Take 1 tablet (10 mg total) by mouth daily. 07/26/14   Aleksei Plotnikov V, MD  fluticasone (FLONASE) 50 MCG/ACT nasal spray Place 2 sprays into both nostrils daily. 12/07/13   Minette Headland, NP  levothyroxine (LEVOTHROID) 25 MCG tablet Take 1 tablet (25 mcg total) by mouth daily. 03/11/14   Aleksei Plotnikov V, MD  metoprolol tartrate (LOPRESSOR) 25 MG tablet Take 1 tablet (25 mg total) by mouth 2 (two) times daily. 03/11/14   Aleksei Plotnikov V, MD  Multiple Vitamin (MULTIVITAMIN) tablet Take 1 tablet by mouth daily.    Historical Provider, MD  ondansetron (ZOFRAN) 8 MG tablet Take 0.5 tablets (4 mg total) by mouth every 8 (eight) hours as needed for nausea or vomiting. Patient not taking: Reported on 12/20/2014 12/09/14   Curt Bears, MD  oxyCODONE (OXY IR/ROXICODONE) 5 MG immediate release tablet Take 1-2 tablets (  5-10 mg total) by mouth every 6 (six) hours as needed for moderate pain or severe pain. 12/20/14   Curt Bears, MD  tobramycin-dexamethasone [facility]) ophthalmic solution Place 2 drops into both eyes every 4 (four) hours while awake. 12/07/13   Minette Headland, NP    triamterene-hydrochlorothiazide (MAXZIDE-25) 37.5-25 MG per tablet TAKE ONE TABLET BY MOUTH EVERY MORNING 07/26/14   Cassandria Anger, MD  Umeclidinium-Vilanterol (ANORO ELLIPTA) 62.5-25 MCG/INH AEPB Inhale 1 Act into the lungs daily. 11/28/14   Cassandria Anger, MD    Current Facility-Administered Medications  Medication Dose Route Frequency Provider Last Rate Last Dose  . acetaminophen (TYLENOL) tablet 650 mg  650 mg Oral Q6H PRN Nishant Dhungel, MD       Or  . acetaminophen (TYLENOL) suppository 650 mg  650 mg Rectal Q6H PRN Nishant Dhungel, MD      . albuterol (PROVENTIL) (2.5 MG/3ML) 0.083% nebulizer solution 2.5 mg  2.5 mg Nebulization Q6H Nishant Dhungel, MD      . albuterol (PROVENTIL) (2.5 MG/3ML) 0.083% nebulizer solution 2.5 mg  2.5 mg Nebulization Q2H PRN Nishant Dhungel, MD      . enoxaparin (LOVENOX) injection 40 mg  40 mg Subcutaneous Q24H Nishant Dhungel, MD      . fluticasone (FLONASE) 50 MCG/ACT nasal spray 2 spray  2 spray Each Nare Daily Nishant Dhungel, MD      . guaiFENesin-dextromethorphan (ROBITUSSIN DM) 100-10 MG/5ML syrup 5 mL  5 mL Oral Q4H PRN Nishant Dhungel, MD      . methylPREDNISolone sodium succinate (SOLU-MEDROL) 125 mg/2 mL injection 60 mg  60 mg Intravenous 3 times per day Nishant Dhungel, MD      . ondansetron (ZOFRAN) tablet 4 mg  4 mg Oral Q6H PRN Nishant Dhungel, MD       Or  . ondansetron (ZOFRAN) injection 4 mg  4 mg Intravenous Q6H PRN Nishant Dhungel, MD      . pantoprazole (PROTONIX) injection 40 mg  40 mg Intravenous Q12H Nishant Dhungel, MD      . tobramycin-dexamethasone (TOBRADEX) ophthalmic suspension 2 drop  2 drop Both Eyes Q4H while awake Nishant Dhungel, MD      . Derrill Memo ON 12/23/2014] triamterene-hydrochlorothiazide (MAXZIDE-25) 37.5-25 MG per tablet 1 tablet  1 tablet Oral q morning - 10a Nishant Dhungel, MD      . Umeclidinium-Vilanterol 62.5-25 MCG/INH AEPB 1 Act  1 Act Inhalation Daily Nishant Dhungel, MD        Allergies as of  12/22/2014  . (No Known Allergies)    Family History  Problem Relation Age of Onset  . Lung cancer Father 19    smoked  . Stomach cancer Mother     Social History   Social History  . Marital Status: Married    Spouse Name: N/A  . Number of Children: N/A  . Years of Education: N/A   Occupational History  . Not on file.   Social History Main Topics  . Smoking status: Former Smoker -- 0.50 packs/day for 32 years    Types: Cigarettes    Quit date: 01/21/1998  . Smokeless tobacco: Never Used  . Alcohol Use: 1.8 oz/week    2 Cans of beer, 1 Shots of liquor per week     Comment: 11/29/2014 "couple bottles of beer maybe twice/wk; shot of vodka once/week"  . Drug Use: No  . Sexual Activity: No   Other Topics Concern  . Not on file   Social History Narrative  Review of Systems: Ten point ROS is O/W negative except as mentioned in HPI.  Physical Exam: Vital signs in last 24 hours: Temp:  [97.7 F (36.5 C)] 97.7 F (36.5 C) (12/01 0800) Pulse Rate:  [113] 113 (12/01 0800) Resp:  [18] 18 (12/01 0800) BP: (137)/(68) 137/68 mmHg (12/01 0800) SpO2:  [95 %] 95 % (12/01 0800)   General:  Alert, pleasant and cooperative in moderate respiratory distress. Head:  Normocephalic and atraumatic. Eyes:  Sclera clear, no icterus.  Conjunctiva pink. Ears:  Normal auditory acuity. Mouth:  No deformity or lesions.   Lungs:  Wheezing heard throughout.  Significantly increased respiratory effort.  Pleurex catheter noted. Heart:  Tachy.  No M/R/G. Abdomen:  Soft, non-distended.  BS present.  Non-tender. Rectal:  Deferred  Msk:  Symmetrical without gross deformities. Pulses:  Normal pulses noted. Extremities:  Without clubbing or edema. Neurologic:  Alert and  oriented x 4;  grossly normal neurologically. Skin:  Intact without significant lesions or rashes. Psych:  Alert and cooperative. Normal mood and affect.  Lab Results:  Recent Labs  12/20/14 1102  WBC 10.7*  HGB 12.2*    HCT 36.5*  PLT 336   BMET  Recent Labs  12/20/14 1102  NA 135*  K 3.9  CO2 29  GLUCOSE 98  BUN 14.5  CREATININE 0.9  CALCIUM 9.6   LFT  Recent Labs  12/20/14 1102  PROT 6.5  ALBUMIN 2.7*  AST 16  ALT 17  ALKPHOS 65  BILITOT 0.54   IMPRESSION:  -Dysphagia, progressive over the past 1-2 weeks but particularly worse for the past 3-4 days:  PET scan showing abnormalities in the esophagus.  Oncology requesting possible EGD to assess. -Non-small cell lung cancer:  Respiratory status is very poor today.  Will reassess in AM and decide regarding EGD but breathing needs to improve quite a bit before we will consider sedating for procedure.  ZEHR, JESSICA D.  12/22/2014, 3:31 PM  Pager number [phone]   ________________________________________________________________________  Velora Heckler GI MD note:  I personally examined the patient, reviewed the data and agree with the assessment and plan described above.  He has widespread cancer throughout his chest.  Progressing, but will be trying a new regimen soon.  PET scan is abnormal in mid esophagus but there is PET avid uptake throughout the chest and difficult to say what this represents.  EGD would be helpful.  He's also had dysphagia.  He has labored breathing currently and is not safe to sedate for endoscopy.  Will follow along, can proceed with EGD if breathing improves.   Owens Loffler, MD [facility] Gastroenterology Pager [phone]

## 2014-12-22 NOTE — Telephone Encounter (Signed)
Being admitted today. Declined APP appointment for tomorrow 12-23-14.

## 2014-12-22 NOTE — Progress Notes (Signed)
Received a call from Ms. Selena Lesser, NP at the cancer center regarding Mr. Dwan Hemmelgarn.  This is a 74 year old male with a history of non-small cell lung cancer, adenocarcinoma who is received 4 cycles of chemotherapy however recently had a PET scan which showed progression of his disease, question whether it is affecting his esophagus. Patient does follow with Dr. Earlie Server.  Patient presented to the office yesterday and was found to be dehydrated. The plan was to give him IV hydration yesterday today and tomorrow. However overnight, patient called the office again and stated he was unable to swallow anything.  Over the past several days, he has been unable to swallow much more than water and some ice cream. He has had some shortness of breath however has been using his home oxygen at 2 L. Patient does have a right Pleurx catheter in which is drained every other day. This is also being followed by Dr. Roxan Hockey. Was supposed to be seeing gastroenterology on December 8 for evaluation of this questionable esophageal mass versus edema.   Of note, Patient was supposed to start [place] on 12/27/2014.    Accepted to medical bed at [facility]. Patient is currently in the office with an interpreter as well as his daughter. Vital signs are noted to be stable.  Time spent: 15 minutes  Shyne Resch D.O. [facility] Pager [phone]  If 7PM-7AM, please contact night-coverage [email or web address] Password [email or web address] 12/22/2014, 12:00 PM

## 2014-12-23 ENCOUNTER — Inpatient Hospital Stay (HOSPITAL_COMMUNITY): Payer: BLUE CROSS/BLUE SHIELD | Admitting: Anesthesiology

## 2014-12-23 ENCOUNTER — Ambulatory Visit
Admit: 2014-12-23 | Discharge: 2014-12-23 | Disposition: A | Discharge status: Home / Self Care | Payer: BLUE CROSS/BLUE SHIELD | Attending: Radiation Oncology | Admitting: Radiation Oncology

## 2014-12-23 ENCOUNTER — Encounter (HOSPITAL_COMMUNITY)
Admission: AD | Disposition: A | Discharge status: Home Health | Payer: Self-pay | Source: Ambulatory Visit | Attending: Internal Medicine

## 2014-12-23 ENCOUNTER — Telehealth: Payer: Self-pay | Admitting: Medical Oncology

## 2014-12-23 ENCOUNTER — Encounter: Payer: BLUE CROSS/BLUE SHIELD | Admitting: Nurse Practitioner

## 2014-12-23 ENCOUNTER — Encounter (HOSPITAL_COMMUNITY): Payer: Self-pay

## 2014-12-23 DIAGNOSIS — J90 Pleural effusion, not elsewhere classified: Secondary | ICD-10-CM

## 2014-12-23 DIAGNOSIS — R0600 Dyspnea, unspecified: Secondary | ICD-10-CM

## 2014-12-23 DIAGNOSIS — R1310 Dysphagia, unspecified: Secondary | ICD-10-CM

## 2014-12-23 DIAGNOSIS — R948 Abnormal results of function studies of other organs and systems: Secondary | ICD-10-CM | POA: Diagnosis present

## 2014-12-23 DIAGNOSIS — E46 Unspecified protein-calorie malnutrition: Secondary | ICD-10-CM

## 2014-12-23 DIAGNOSIS — R942 Abnormal results of pulmonary function studies: Secondary | ICD-10-CM

## 2014-12-23 DIAGNOSIS — C3491 Malignant neoplasm of unspecified part of right bronchus or lung: Secondary | ICD-10-CM

## 2014-12-23 DIAGNOSIS — R634 Abnormal weight loss: Secondary | ICD-10-CM | POA: Diagnosis present

## 2014-12-23 HISTORY — PX: PEG PLACEMENT: SHX5437

## 2014-12-23 HISTORY — PX: ESOPHAGOGASTRODUODENOSCOPY: SHX5428

## 2014-12-23 SURGERY — EGD (ESOPHAGOGASTRODUODENOSCOPY)
Anesthesia: Monitor Anesthesia Care

## 2014-12-23 SURGERY — EGD (ESOPHAGOGASTRODUODENOSCOPY)
Anesthesia: Moderate Sedation

## 2014-12-23 MED ORDER — PROPOFOL 10 MG/ML IV BOLUS
INTRAVENOUS | Status: AC
Start: 2014-12-23 — End: 2014-12-23
  Filled 2014-12-23: qty 40

## 2014-12-23 MED ORDER — PROPOFOL 500 MG/50ML IV EMUL
INTRAVENOUS | Status: DC | PRN
  Administered 2014-12-23: 100 ug/kg/min via INTRAVENOUS

## 2014-12-23 MED ORDER — PROPOFOL 10 MG/ML IV BOLUS
INTRAVENOUS | Status: AC
Start: 2014-12-23 — End: 2014-12-23
  Filled 2014-12-23: qty 20

## 2014-12-23 MED ORDER — PROPOFOL 10 MG/ML IV BOLUS
INTRAVENOUS | Status: DC | PRN
  Administered 2014-12-23 (×3): 20 mg via INTRAVENOUS

## 2014-12-23 MED ORDER — SODIUM CHLORIDE 0.9 % IJ SOLN
INTRAMUSCULAR | Status: AC
  Filled 2014-12-23: qty 10

## 2014-12-23 MED ORDER — EPHEDRINE SULFATE 50 MG/ML IJ SOLN
INTRAMUSCULAR | Status: AC
  Filled 2014-12-23: qty 2

## 2014-12-23 MED ORDER — DEXTROSE-NACL 5-0.9 % IV SOLN: INTRAVENOUS | Status: DC

## 2014-12-23 MED ORDER — SODIUM CHLORIDE 0.9 % IV SOLN
INTRAVENOUS | Status: DC | PRN
  Administered 2014-12-23 (×2): via INTRAVENOUS

## 2014-12-23 NOTE — Anesthesia Preprocedure Evaluation (Addendum)
Anesthesia Evaluation  Patient identified by MRN, date of birth, ID band Patient awake    Reviewed: Allergy & Precautions, NPO status , Patient's Chart, lab work & pertinent test results  Airway Mallampati: II  TM Distance: >3 FB Neck ROM: Full    Dental   Pulmonary former smoker,  Lung CA s/p LL lobectomy   breath sounds clear to auscultation       Cardiovascular hypertension, + Peripheral Vascular Disease  + dysrhythmias  Rhythm:Regular Rate:Normal     Neuro/Psych Anxiety Depression negative neurological ROS     GI/Hepatic negative GI ROS, Neg liver ROS,   Endo/Other  Hypothyroidism   Renal/GU negative Renal ROS     Musculoskeletal negative musculoskeletal ROS (+)   Abdominal   Peds  Hematology  (+) anemia ,   Anesthesia Other Findings   Reproductive/Obstetrics                            Anesthesia Physical Anesthesia Plan  ASA: IV  Anesthesia Plan: MAC   Post-op Pain Management:    Induction: Intravenous  Airway Management Planned: Simple Face Mask, Natural Airway and Nasal Cannula  Additional Equipment:   Intra-op Plan:   Post-operative Plan:   Informed Consent: I have reviewed the patients History and Physical, chart, labs and discussed the procedure including the risks, benefits and alternatives for the proposed anesthesia with the patient or authorized representative who has indicated his/her understanding and acceptance.     Plan Discussed with: CRNA  Anesthesia Plan Comments:        Anesthesia Quick Evaluation

## 2014-12-23 NOTE — H&P (View-Only) (Signed)
Referring Provider: Dr. Clementeen Graham, [facility] Primary Care Physician:  [name], MD Primary Gastroenterologist:  None, unassigned with [place] primary as well  Reason for Consultation:  Dysphagia  HPI: Brandon Newton is a 74 y.o. male with recurrent non-small cell lung cancer.  Admitted today for SOB and dysphagia.  PET scan just 3 days ago shows the following:   IMPRESSION: 1. Today's study demonstrates marked progression of disease with widespread bilateral mediastinal and hilar lymphadenopathy, right cervical lymphadenopathy, malignant right pleural effusion, evidence of probable lymphangitic spread of tumor throughout the right lung, left adrenal metastasis and some nonenlarged but hypermetabolic upper abdominal and retroperitoneal lymph nodes which are presumably metastatic. 2. Focal thickening and hypermetabolism of the mid esophagus. This may reflect esophagitis, but the possibility of concurrent esophageal neoplasm is not excluded. This could be further evaluated with endoscopy if of clinical concern. 3. Extensive atherosclerosis, including left main and 3 vessel coronary artery disease, in addition to bilateral common iliac artery aneurysms, as above. 4. Right-sided PleurX catheter in position appears appropriately located. Post procedural changes of recent right-sided talc pleurodesis, as above.  Dr. Earlie Server wanted patient evaluated by GI for possible EGD.    Patient apparently has progressive difficulty swallowing over the past 2 weeks but mostly worse over the past 3-4 days.  Says that he can take small sips of liquids but anything more than that will come back up.  His daughter is an anesthesiologist in [place] and is on her way here now; should be here this evening.   Past Medical History  Diagnosis Date  . Anxiety   . Hypertension   . Depression   . Squamous carcinoma (HCC)     "face & scale"  . Non-small cell carcinoma of left lung, stage 1 ([place]) 08/2013   postoperative chemotherapy  . Hypothyroidism   . Pleural effusion     Archie Endo 11/29/2014  . Dysrhythmia     after OR - resolved    Past Surgical History  Procedure Laterality Date  . Appendectomy    . Video assisted thoracoscopy (vats)/wedge resection Left 09/10/2013    Procedure: LEFT VIDEO ASSISTED THORACOSCOPY ,WEDGE RESECTION LEFT LOWER LOBE LUNG,LEFT LOWER LOBECTOMY, & NODE SAMPLING ;  Surgeon: Melrose Nakayama, MD;  Location: [place];  Service: Thoracic;  Laterality: Left;  . Chest tube insertion Right 12/07/2014    Procedure: INSERTION PLEURAL DRAINAGE CATHETER;  Surgeon: Melrose Nakayama, MD;  Location: [place];  Service: Thoracic;  Laterality: Right;    Prior to Admission medications   Medication Sig Start Date End Date Taking? Authorizing Provider  escitalopram (LEXAPRO) 10 MG tablet Take 1 tablet (10 mg total) by mouth daily. 07/26/14   Aleksei Plotnikov V, MD  fluticasone (FLONASE) 50 MCG/ACT nasal spray Place 2 sprays into both nostrils daily. 12/07/13   Minette Headland, NP  levothyroxine (LEVOTHROID) 25 MCG tablet Take 1 tablet (25 mcg total) by mouth daily. 03/11/14   Aleksei Plotnikov V, MD  metoprolol tartrate (LOPRESSOR) 25 MG tablet Take 1 tablet (25 mg total) by mouth 2 (two) times daily. 03/11/14   Aleksei Plotnikov V, MD  Multiple Vitamin (MULTIVITAMIN) tablet Take 1 tablet by mouth daily.    Historical Provider, MD  ondansetron (ZOFRAN) 8 MG tablet Take 0.5 tablets (4 mg total) by mouth every 8 (eight) hours as needed for nausea or vomiting. Patient not taking: Reported on 12/20/2014 12/09/14   Curt Bears, MD  oxyCODONE (OXY IR/ROXICODONE) 5 MG immediate release tablet Take 1-2 tablets (  5-10 mg total) by mouth every 6 (six) hours as needed for moderate pain or severe pain. 12/20/14   Curt Bears, MD  tobramycin-dexamethasone [facility]) ophthalmic solution Place 2 drops into both eyes every 4 (four) hours while awake. 12/07/13   Minette Headland, NP    triamterene-hydrochlorothiazide (MAXZIDE-25) 37.5-25 MG per tablet TAKE ONE TABLET BY MOUTH EVERY MORNING 07/26/14   Cassandria Anger, MD  Umeclidinium-Vilanterol (ANORO ELLIPTA) 62.5-25 MCG/INH AEPB Inhale 1 Act into the lungs daily. 11/28/14   Cassandria Anger, MD    Current Facility-Administered Medications  Medication Dose Route Frequency Provider Last Rate Last Dose  . acetaminophen (TYLENOL) tablet 650 mg  650 mg Oral Q6H PRN Nishant Dhungel, MD       Or  . acetaminophen (TYLENOL) suppository 650 mg  650 mg Rectal Q6H PRN Nishant Dhungel, MD      . albuterol (PROVENTIL) (2.5 MG/3ML) 0.083% nebulizer solution 2.5 mg  2.5 mg Nebulization Q6H Nishant Dhungel, MD      . albuterol (PROVENTIL) (2.5 MG/3ML) 0.083% nebulizer solution 2.5 mg  2.5 mg Nebulization Q2H PRN Nishant Dhungel, MD      . enoxaparin (LOVENOX) injection 40 mg  40 mg Subcutaneous Q24H Nishant Dhungel, MD      . fluticasone (FLONASE) 50 MCG/ACT nasal spray 2 spray  2 spray Each Nare Daily Nishant Dhungel, MD      . guaiFENesin-dextromethorphan (ROBITUSSIN DM) 100-10 MG/5ML syrup 5 mL  5 mL Oral Q4H PRN Nishant Dhungel, MD      . methylPREDNISolone sodium succinate (SOLU-MEDROL) 125 mg/2 mL injection 60 mg  60 mg Intravenous 3 times per day Nishant Dhungel, MD      . ondansetron (ZOFRAN) tablet 4 mg  4 mg Oral Q6H PRN Nishant Dhungel, MD       Or  . ondansetron (ZOFRAN) injection 4 mg  4 mg Intravenous Q6H PRN Nishant Dhungel, MD      . pantoprazole (PROTONIX) injection 40 mg  40 mg Intravenous Q12H Nishant Dhungel, MD      . tobramycin-dexamethasone (TOBRADEX) ophthalmic suspension 2 drop  2 drop Both Eyes Q4H while awake Nishant Dhungel, MD      . Derrill Memo ON 12/23/2014] triamterene-hydrochlorothiazide (MAXZIDE-25) 37.5-25 MG per tablet 1 tablet  1 tablet Oral q morning - 10a Nishant Dhungel, MD      . Umeclidinium-Vilanterol 62.5-25 MCG/INH AEPB 1 Act  1 Act Inhalation Daily Nishant Dhungel, MD        Allergies as of  12/22/2014  . (No Known Allergies)    Family History  Problem Relation Age of Onset  . Lung cancer Father 45    smoked  . Stomach cancer Mother     Social History   Social History  . Marital Status: Married    Spouse Name: N/A  . Number of Children: N/A  . Years of Education: N/A   Occupational History  . Not on file.   Social History Main Topics  . Smoking status: Former Smoker -- 0.50 packs/day for 32 years    Types: Cigarettes    Quit date: 01/21/1998  . Smokeless tobacco: Never Used  . Alcohol Use: 1.8 oz/week    2 Cans of beer, 1 Shots of liquor per week     Comment: 11/29/2014 "couple bottles of beer maybe twice/wk; shot of vodka once/week"  . Drug Use: No  . Sexual Activity: No   Other Topics Concern  . Not on file   Social History Narrative  Review of Systems: Ten point ROS is O/W negative except as mentioned in HPI.  Physical Exam: Vital signs in last 24 hours: Temp:  [97.7 F (36.5 C)] 97.7 F (36.5 C) (12/01 0800) Pulse Rate:  [113] 113 (12/01 0800) Resp:  [18] 18 (12/01 0800) BP: (137)/(68) 137/68 mmHg (12/01 0800) SpO2:  [95 %] 95 % (12/01 0800)   General:  Alert, pleasant and cooperative in moderate respiratory distress. Head:  Normocephalic and atraumatic. Eyes:  Sclera clear, no icterus.  Conjunctiva pink. Ears:  Normal auditory acuity. Mouth:  No deformity or lesions.   Lungs:  Wheezing heard throughout.  Significantly increased respiratory effort.  Pleurex catheter noted. Heart:  Tachy.  No M/R/G. Abdomen:  Soft, non-distended.  BS present.  Non-tender. Rectal:  Deferred  Msk:  Symmetrical without gross deformities. Pulses:  Normal pulses noted. Extremities:  Without clubbing or edema. Neurologic:  Alert and  oriented x 4;  grossly normal neurologically. Skin:  Intact without significant lesions or rashes. Psych:  Alert and cooperative. Normal mood and affect.  Lab Results:  Recent Labs  12/20/14 1102  WBC 10.7*  HGB 12.2*    HCT 36.5*  PLT 336   BMET  Recent Labs  12/20/14 1102  NA 135*  K 3.9  CO2 29  GLUCOSE 98  BUN 14.5  CREATININE 0.9  CALCIUM 9.6   LFT  Recent Labs  12/20/14 1102  PROT 6.5  ALBUMIN 2.7*  AST 16  ALT 17  ALKPHOS 65  BILITOT 0.54   IMPRESSION:  -Dysphagia, progressive over the past 1-2 weeks but particularly worse for the past 3-4 days:  PET scan showing abnormalities in the esophagus.  Oncology requesting possible EGD to assess. -Non-small cell lung cancer:  Respiratory status is very poor today.  Will reassess in AM and decide regarding EGD but breathing needs to improve quite a bit before we will consider sedating for procedure.  ZEHR, JESSICA D.  12/22/2014, 3:31 PM  Pager number [phone]   ________________________________________________________________________  Velora Heckler GI MD note:  I personally examined the patient, reviewed the data and agree with the assessment and plan described above.  He has widespread cancer throughout his chest.  Progressing, but will be trying a new regimen soon.  PET scan is abnormal in mid esophagus but there is PET avid uptake throughout the chest and difficult to say what this represents.  EGD would be helpful.  He's also had dysphagia.  He has labored breathing currently and is not safe to sedate for endoscopy.  Will follow along, can proceed with EGD if breathing improves.   Owens Loffler, MD [facility] Gastroenterology Pager [phone]

## 2014-12-23 NOTE — Progress Notes (Signed)
[facility] PROGRESS NOTE  Brandon Newton TMH:[id number] DOB: 03/23/1940 DOA: 12/22/2014 PCP: [name], MD  Brief narrative 74 year old recent speaking male with recurrent non-small cell lung cancer (adenocarcinoma status post lobectomy in August 2015 followed by adjuvant systemic chemotherapy, now with significant disease progression with recurrent pleural effusion requiring) catheter placement 2 weeks back. Patient presenting with  progressive dyspnea and dysphagia worsened over the past 1-2 week. Recent PET scan showing thickening around mid esophagus possible for tumor spread with extrinsic compression. Admitted to hospitalist service and [place] consulted.  Assessment/Plan: Dysphagia Possibly extrinsic esophageal compression as seen on recent PET scan with mid esophageal thickening. Currently strict nothing by mouth. Added PPI. Seen by GI with plan on EGD this morning with PEG placement for his dysphagia and weight loss and need for nutritional support.  Dyspnea with Acute hypoxic respiratory failure Patient extremely dyspneic and wheezy on presentation. Appears to be associated with progressive lung cancer with pulmonary edema. Chest x-ray shows bilateral pleural effusion possible left  lung base consolidation. Responded to IV Solu-Medrol, scheduled nebs and her dose of Lasix. Added empiric Levaquin. Also had 300 mL of pleural fluid removed from his right Pleurx catheter. (At home patient has up to 300 mL removed every other day). Symptoms much improved today. We'll put him on daily Lasix.  Essential hypertension Blood pressure medications. Place him on when necessary hydralazine.  History of a flutter As per daughter this was transient after his lobectomy. Patient is not on anticoagulation. He is currently in sinus rhythm.    Non-small cell carcinoma of right lung, stage 4 ([place]) This is progressed with widespread metastases. Dr. Julien Nordmann as discussed options for  hospice versus immunotherapy with the patient and patient inclined towards starting immunotherapy. Scheduled to be started on 12/27/2014. Dr. Salina April and will follow patient while in the hospital  Protein calorie malnutrition  Hypothyroidism Patient currently nothing by mouth. place him on IV Synthroid.  Hypokalemia Mild. will replenish once tube feeding started.    DVT prophylaxis: Subcutaneous Lovenox Diet: Nothing by mouth  Code Status: Full code Family Communication: Daughter at bedside who interpreted for the patient and  agrees with plan. She is an anesthesiologist in [place] , [place]. Disposition Plan: Continue inpatient monitoring   Consultants:  [facility]  Dr. Julien Nordmann  Procedures:  EGD with PEG placement on 12/2  Antibiotics:  Levaquin 12/1-  HPI/Subjective: Seen and examined. Shortness of breath much better today.  Objective: Filed Vitals:   12/23/14 0609 12/23/14 0910  BP: 134/79   Pulse: 96 95  Temp: 97.4 F (36.3 C)   Resp: 17 20    Intake/Output Summary (Last 24 hours) at 12/23/14 1133 Last data filed at 12/23/14 9211  Gross per 24 hour  Intake      0 ml  Output    750 ml  Net   -750 ml   There were no vitals filed for this visit.  Exam:   General:  Elderly male not in distress  HEENT: No pallor, dry oral mucosa, supple neck  Chest: Diminished bibasilar breath sounds, no added sounds, right-sided Pleurx catheter  Cardiovascular: Normal S1 and S2, no murmurs rub or gallop  GI: Soft, nondistended, nontender, bowel sounds present  Musculoskeletal: Warm, no edema  CNS: Alert and oriented, hard of hearing  Data Reviewed: Basic Metabolic Panel:  Recent Labs Lab 12/20/14 1102 12/22/14 1550  NA 135* 136  K 3.9 3.4*  CL  --  98*  CO2 29 27  GLUCOSE  98 80  BUN 14.5 14  CREATININE 0.9 0.96  1.02  CALCIUM 9.6 8.8*   Liver Function Tests:  Recent Labs Lab 12/20/14 1102 12/22/14 1550  AST 16 20  ALT 17 18  ALKPHOS 65 55   BILITOT 0.54 1.1  PROT 6.5 6.2*  ALBUMIN 2.7* 2.9*   No results for input(s): LIPASE, AMYLASE in the last 168 hours. No results for input(s): AMMONIA in the last 168 hours. CBC:  Recent Labs Lab 12/20/14 1102 12/22/14 7846  WBC 96.2* DUPLICATE REQUEST  8.1  NEUTROABS 8.1* 5.6  HGB 95.2* DUPLICATE REQUEST  84.1*  HCT 32.4* DUPLICATE REQUEST  40.1*  MCV 02.7 DUPLICATE REQUEST  25.3  PLT 664 DUPLICATE REQUEST  403   Cardiac Enzymes: No results for input(s): CKTOTAL, CKMB, CKMBINDEX, TROPONINI in the last 168 hours. BNP (last 3 results)  Recent Labs  12/22/14 1550  BNP 82.8    ProBNP (last 3 results) No results for input(s): PROBNP in the last 8760 hours.  CBG:  Recent Labs Lab 12/19/14 1356  GLUCAP 82    No results found for this or any previous visit (from the past 240 hour(s)).   Studies: Dg Chest Port 1 View  12/22/2014  CLINICAL DATA:  Chronic severe shortness breath and cough EXAM: PORTABLE CHEST 1 VIEW COMPARISON:  December 07, 2014 FINDINGS: The mediastinal contour is normal. The heart size is enlarged. There is pulmonary edema. There are bilateral pleural effusions, left greater than right. Consolidation of left lung base is identified. There is chronic deformity of the left clavicle. IMPRESSION: Pulmonary edema. Bilateral pleural effusions, left greater than right. Consolidation of left lung base underlying pneumonia is not excluded. Electronically Signed   By: Abelardo Diesel M.D.   On: 12/22/2014 16:34    Scheduled Meds: . albuterol  2.5 mg Nebulization TID  . enoxaparin (LOVENOX) injection  40 mg Subcutaneous Q24H  . fluticasone  2 spray Each Nare Daily  . levofloxacin (LEVAQUIN) IV  750 mg Intravenous Q24H  . levothyroxine  12.5 mcg Intravenous Daily  . methylPREDNISolone (SOLU-MEDROL) injection  60 mg Intravenous 3 times per day  . pantoprazole (PROTONIX) IV  40 mg Intravenous Q12H  . tobramycin-dexamethasone  2 drop Both Eyes Q4H while awake  .  triamterene-hydrochlorothiazide  1 tablet Oral q morning - 10a  . Umeclidinium-Vilanterol  1 Act Inhalation Daily   Continuous Infusions:     Time spent: 25 minutes    [name], [name]  [facility] Pager [phone]. If 7PM-7AM, please contact night-coverage at [email or web address], password [facility] 12/23/2014, 11:33 AM  LOS: 1 day

## 2014-12-23 NOTE — Plan of Care (Signed)
Problem: Activity: Goal: Ability to implement measures to reduce episodes of fatigue will improve Outcome: Not Progressing Patient unable to ambulate to bathroom without weakness, fatigue, SOB  Problem: Nutritional: Goal: Maintenance of adequate nutrition will improve Outcome: Not Progressing Patient is NPO. Pain and difficulty swallowing over last 3 weeks.   Problem: Safety: Goal: Ability to remain free from injury will improve Outcome: Progressing Bed alarm activated. Free of falls/injury.

## 2014-12-23 NOTE — Progress Notes (Signed)
Subjective: The patient is seen and examined today. His daughter [name] was at the bedside. He was admitted yesterday with worsening dyspnea as well as difficulty swallowing and poor nutrition and dehydration. He is feeling a little bit better today but continues to require oxygen. He was seen by gastroenterology and expected to have upper endoscopy today. The patient denied having any fever or chills. His recent PET scan showed progressive disease on the right side of the chest.  Objective: Vital signs in last 24 hours: Temp:  [97.4 F (36.3 C)-97.8 F (36.6 C)] 97.4 F (36.3 C) (12/02 0609) Pulse Rate:  [95-110] 95 (12/02 0910) Resp:  [17-20] 20 (12/02 0910) BP: (104-134)/(59-79) 134/79 mmHg (12/02 0609) SpO2:  [85 %-96 %] 94 % (12/02 0910)  Intake/Output from previous day: 12/01 0701 - 12/02 0700 In: -  Out: 750 [Urine:450; Chest Tube:300] Intake/Output this shift:    General appearance: alert, cooperative, fatigued and no distress Resp: diminished breath sounds RLL and dullness to percussion RLL Cardio: regular rate and rhythm, S1, S2 normal, no murmur, click, rub or gallop GI: soft, non-tender; bowel sounds normal; no masses,  no organomegaly Extremities: extremities normal, atraumatic, no cyanosis or edema  Lab Results:   Recent Labs  12/20/14 1102 12/22/14 6761  WBC 95.0* DUPLICATE REQUEST  8.1  HGB 93.2* DUPLICATE REQUEST  67.1*  HCT 24.5* DUPLICATE REQUEST  80.9*  PLT 983 DUPLICATE REQUEST  382   BMET  Recent Labs  12/20/14 1102 12/22/14 1550  NA 135* 136  K 3.9 3.4*  CL  --  98*  CO2 29 27  GLUCOSE 98 80  BUN 14.5 14  CREATININE 0.9 0.96  1.02  CALCIUM 9.6 8.8*    Studies/Results: Dg Chest Port 1 View  12/22/2014  CLINICAL DATA:  Chronic severe shortness breath and cough EXAM: PORTABLE CHEST 1 VIEW COMPARISON:  December 07, 2014 FINDINGS: The mediastinal contour is normal. The heart size is enlarged. There is pulmonary edema. There are bilateral  pleural effusions, left greater than right. Consolidation of left lung base is identified. There is chronic deformity of the left clavicle. IMPRESSION: Pulmonary edema. Bilateral pleural effusions, left greater than right. Consolidation of left lung base underlying pneumonia is not excluded. Electronically Signed   By: Abelardo Diesel M.D.   On: 12/22/2014 16:34    Medications: I have reviewed the patient's current medications.   Assessment/Plan: This is a very pleasant 74 years old white male with recurrent non-small cell lung cancer, adenocarcinoma with positive PDL 1 expression 50%. His molecular studies showed negative EGFR mutation and negative ALK gene translocation. The patient had significant disease progression on the right side of the chest with recurrent pleural effusion.  1) metastatic non-small cell lung cancer: He is expected to start treatment with [place] next week. I also discussed with the patient and his daughter the role of palliative care. 2) dysphagia: The recent PET scan showed mid esophageal activity questionable to be esophagitis versus possibly a malignancy versus extrinsic compression. The patient may benefit from upper endoscopy for further evaluation of his condition. He may also benefit from a PEG tube placement for nutritional supplements during his treatment. I will also consult radiation oncology for evaluation of his condition and palliative radiotherapy if his dysphagia is consistent with extrinsic compression from the tumor. 3) recurrent right pleural effusion: Continue drainage via the Pleurx catheter. Thank you so much for taking good care of Mr. Brandon Newton. I will continue to follow up the patient with you  and assist in his management.    LOS: 1 day    Dewayne Severe K. 12/23/2014

## 2014-12-23 NOTE — Progress Notes (Signed)
Patient in Endo for procedure at time of scheduled treatment. RT will continue to monitor.

## 2014-12-23 NOTE — Interval H&P Note (Signed)
History and Physical Interval Note:  12/23/2014 2:01 PM  Brandon Newton  has presented today for surgery, with the diagnosis of Dysphagia  The various methods of treatment have been discussed with the patient and family. After consideration of risks, benefits and other options for treatment, the patient has consented to  Procedure(s): ESOPHAGOGASTRODUODENOSCOPY (EGD) (N/A) PERCUTANEOUS ENDOSCOPIC GASTROSTOMY (PEG) PLACEMENT (N/A) as a surgical intervention .  The patient's history has been reviewed, patient examined, no change in status, stable for surgery.  I have reviewed the patient's chart and labs.  Questions were answered to the patient's satisfaction.     Pricilla Riffle. Fuller Plan

## 2014-12-23 NOTE — Anesthesia Postprocedure Evaluation (Signed)
Anesthesia Post Note  Patient: Brandon Newton  Procedure(s) Performed: Procedure(s) (LRB): ESOPHAGOGASTRODUODENOSCOPY (EGD) (N/A) PERCUTANEOUS ENDOSCOPIC GASTROSTOMY (PEG) PLACEMENT (N/A)  Patient location during evaluation: PACU Anesthesia Type: MAC Level of consciousness: awake and alert Pain management: pain level controlled Vital Signs Assessment: post-procedure vital signs reviewed and stable Respiratory status: spontaneous breathing, patient connected to nasal cannula oxygen and respiratory function stable Cardiovascular status: blood pressure returned to baseline Anesthetic complications: no    Last Vitals:  Filed Vitals:   12/23/14 1313 12/23/14 1508  BP: 125/73 123/70  Pulse: 107 110  Temp:  36.8 C  Resp: 21 27    Last Pain:  Filed Vitals:   12/23/14 1525  PainSc: Asleep                 Tiajuana Amass

## 2014-12-23 NOTE — Progress Notes (Signed)
Spoke with the GI PA in regards to safely being able to accommodate the EGD with PEG placement for this patient.  After speaking with Endoscopy management the plan is to have 2 RNs in the room along with the GI tech.  Respiratory therapy has also been contacted and will be available in the room during the procedure.  Dr Fuller Plan is okay with the patients daughter being present in the room during the procedure to be able to interpret for the patient and talk him through what's going on.  Will have the 'waiver of right to free interpreter services' signed in accordance with the facility policy.  I let Jess (GI PA) know that Dr Fuller Plan would need to put a note in saying that he is okay with the family being present during the procedure.

## 2014-12-23 NOTE — Transfer of Care (Signed)
Immediate Anesthesia Transfer of Care Note  Patient: Linn Goetze  Procedure(s) Performed: Procedure(s): ESOPHAGOGASTRODUODENOSCOPY (EGD) (N/A) PERCUTANEOUS ENDOSCOPIC GASTROSTOMY (PEG) PLACEMENT (N/A)  Patient Location: PACU and Endoscopy Unit  Anesthesia Type:MAC  Level of Consciousness: awake, alert  and oriented  Airway & Oxygen Therapy: Patient Spontanous Breathing and Patient connected to nasal cannula oxygen  Post-op Assessment: Report given to RN and Post -op Vital signs reviewed and stable  Post vital signs: Reviewed and stable  Last Vitals:  Filed Vitals:   12/23/14 0910 12/23/14 1313  BP:  125/73  Pulse: 95 107  Temp:    Resp: 20 21    Complications: No apparent anesthesia complications

## 2014-12-23 NOTE — Progress Notes (Signed)
Radiation Oncology         [phone] ________________________________  Initial inpatient Consultation  Name: Brandon Newton MRN: [id number]  Date: 12/22/2014  DOB: 04-Jan-1941  CC:[name], MD  No ref. provider found   REFERRING PHYSICIAN: No ref. provider found  DIAGNOSIS: The encounter diagnosis was Shortness of breath.   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    ICD-9-CM ICD-10-CM   1. Shortness of breath 786.05 R06.02 DG Chest Port 1 View     DG Chest Port 1 View    HISTORY OF PRESENT ILLNESS::Brandon Newton is a 74 y.o. male was past medical history significant for hypertension, depression, atrial fibrillation, and anxiety and long history of smoking but quit 15 years ago. The patient was visiting his daughter, Lanelle Bal, in [place] who has a medical degree. She noticed that he was getting short of breath with mild activity. He was referred to his primary care physician Dr. Alain Marion. Chest x-ray was performed on 07/27/2013. It showed a 3.0 x 2.0 CM nodular opacity in the left lower lobe concerning for a mass. This was followed by CT scan of the chest without contrast on 08/23/2013 and it showed a subpleural left lower lobe pulmonary nodule measuring 2.8 x 2.1 CM. There was no pathologically enlarged axillary, hilar or mediastinal lymph nodes. A PET scan was performed on 08/31/2013 and showed the dominant subpleural left lower lobe pulmonary nodule was hypermetabolic and suspicious for bronchogenic malignancy. There was no evidence of metastatic disease. The patient was referred to Dr. Roxan Hockey and on 09/10/2013, he underwent left VATS with thoracoscopic left lower lobectomy and mediastinal lymph node sampling. 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 The dissected lymph nodes  were negative for malignancy. He received adjuvant systemic chemotherapy with carboplatin for AUC of 5 and Alimta 500 MG/M2 every 3 weeks status post 4 cycles. On 11/30/14 chest CT showed significantly larger right pleural effusion with questionable disease recurrence in the mediastinum. On 11/30/2014 the patient underwent ultrasound-guided right thoracentesis with drainage of 1.7 L of dark bloody fluid. The final cytology (Accession: [id number]) was consistent with adenocarcinoma. The tissue was sent for PDL 1 testing and it showed 50% expression. It was also sent for molecular studies. The patient was also seen by Dr. Roxan Hockey and he had Pleurx catheter placed on 12/07/2014 for recurrent right pleural effusion.   He was admitted yesterday with worsening dyspnea as well as difficulty swallowing and poor nutrition and dehydration. He is feeling a little bit better today but continues to require oxygen. His recent PET scan showed progressive disease on the right side of the chest. An endoscopy with percutaneous feeding tube was performed.  He has been referred for consideration of radiation options.    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    PAST SURGICAL HISTORY: Past Surgical History  Procedure Laterality Date  . Appendectomy    . Video assisted thoracoscopy (vats)/wedge resection Left 09/10/2013    Procedure: LEFT VIDEO ASSISTED THORACOSCOPY ,WEDGE RESECTION LEFT LOWER LOBE LUNG,LEFT LOWER LOBECTOMY, & NODE SAMPLING ;  Surgeon: Melrose Nakayama, MD;  Location: [facility];  Service: Thoracic;  Laterality: Left;  . Chest tube insertion Right 12/07/2014  Procedure: INSERTION PLEURAL DRAINAGE CATHETER;  Surgeon: Melrose Nakayama, MD;  Location: [facility];  Service: Thoracic;   Laterality: Right;    FAMILY HISTORY: family history includes Lung cancer (age of onset: 55) in his father; Stomach cancer in his mother.  SOCIAL HISTORY:  Social History   Social History  . Marital Status: Married    Spouse Name: N/A  . Number of Children: N/A  . Years of Education: N/A   Occupational History  . Not on file.   Social History Main Topics  . Smoking status: Former Smoker -- 0.50 packs/day for 32 years    Types: Cigarettes    Quit date: 01/21/1998  . Smokeless tobacco: Never Used  . Alcohol Use: 1.8 oz/week    2 Cans of beer, 1 Shots of liquor per week     Comment: 11/29/2014 "couple bottles of beer maybe twice/wk; shot of vodka once/week"  . Drug Use: No  . Sexual Activity: No   Other Topics Concern  . Not on file   Social History Narrative    ALLERGIES: Review of patient's allergies indicates no known allergies.  MEDICATIONS:  Current Facility-Administered Medications  Medication Dose Route Frequency Provider Last Rate Last Dose  . acetaminophen (TYLENOL) tablet 650 mg  650 mg Oral Q6H PRN Nishant Dhungel, MD       Or  . acetaminophen (TYLENOL) suppository 650 mg  650 mg Rectal Q6H PRN Nishant Dhungel, MD      . albuterol (PROVENTIL) (2.5 MG/3ML) 0.083% nebulizer solution 2.5 mg  2.5 mg Nebulization Q2H PRN Nishant Dhungel, MD      . albuterol (PROVENTIL) (2.5 MG/3ML) 0.083% nebulizer solution 2.5 mg  2.5 mg Nebulization TID Nishant Dhungel, MD   2.5 mg at 12/23/14 0905  . enoxaparin (LOVENOX) injection 40 mg  40 mg Subcutaneous Q24H Nishant Dhungel, MD   40 mg at 12/22/14 2120  . fluticasone (FLONASE) 50 MCG/ACT nasal spray 2 spray  2 spray Each Nare Daily Nishant Dhungel, MD   2 spray at 12/23/14 1100  . levofloxacin (LEVAQUIN) IVPB 750 mg  750 mg Intravenous Q24H Berton Mount, RPH   750 mg at 12/22/14 1814  . levothyroxine (SYNTHROID, LEVOTHROID) injection 12.5 mcg  12.5 mcg Intravenous Daily Nishant Dhungel, MD   12.5 mcg at 12/23/14 1100  .  methylPREDNISolone sodium succinate (SOLU-MEDROL) 125 mg/2 mL injection 60 mg  60 mg Intravenous 3 times per day Nishant Dhungel, MD   60 mg at 12/23/14 0615  . morphine 2 MG/ML injection 1 mg  1 mg Intravenous Q4H PRN Nishant Dhungel, MD      . ondansetron (ZOFRAN) tablet 4 mg  4 mg Oral Q6H PRN Nishant Dhungel, MD       Or  . ondansetron (ZOFRAN) injection 4 mg  4 mg Intravenous Q6H PRN Nishant Dhungel, MD      . pantoprazole (PROTONIX) injection 40 mg  40 mg Intravenous Q12H Nishant Dhungel, MD   40 mg at 12/23/14 1100  . tobramycin-dexamethasone (TOBRADEX) ophthalmic suspension 2 drop  2 drop Both Eyes Q4H while awake Nishant Dhungel, MD   2 drop at 12/23/14 1100  . triamterene-hydrochlorothiazide (MAXZIDE-25) 37.5-25 MG per tablet 1 tablet  1 tablet Oral q morning - 10a Nishant Dhungel, MD   1 tablet at 12/23/14 1102  . Umeclidinium-Vilanterol 62.5-25 MCG/INH AEPB 1 Act  1 Act Inhalation Daily Nishant Dhungel, MD   1 Act at 12/23/14 1157    REVIEW OF SYSTEMS:  A 15 point  review of systems is documented in the electronic medical record. This was obtained by the nursing staff. However, I reviewed this with the patient to discuss relevant findings and make appropriate changes.  Pertinent items are noted in HPI.   Daughter, Lanelle Bal, is a physician and spoke on her father's behalf translating [place]. Brandon Newton experiencing shortness of breath and difficulty swallowing. Swallowing feels as though it is difficult due to an obstruction.    PHYSICAL EXAM:  oral temperature is 98.2 F (36.8 C). His blood pressure is 144/79 and his pulse is 107. His respiration is 25 and oxygen saturation is 93%.   per med onc General appearance: alert, cooperative, fatigued and no distress Resp: diminished breath sounds RLL and dullness to percussion RLL Cardio: regular rate and rhythm, S1, S2 normal, no murmur, click, rub or gallop GI: soft, non-tender; bowel sounds normal; no masses, no  organomegaly Extremities: extremities normal, atraumatic, no cyanosis or edema I agree with the above findings and note that the patient has IV access in the Dorsum of the right foot. Recent PEG tube site appears clean with dressing. Patient speaking [place] with daughter translating. Patient was in no acute distress.   KPS = 70  100 - Normal; no complaints; no evidence of disease. 90   - Able to carry on normal activity; minor signs or symptoms of disease. 80   - Normal activity with effort; some signs or symptoms of disease. 51   - Cares for self; unable to carry on normal activity or to do active work. 60   - Requires occasional assistance, but is able to care for most of his personal needs. 50   - Requires considerable assistance and frequent medical care. 98   - Disabled; requires special care and assistance. 47   - Severely disabled; hospital admission is indicated although death not imminent. 2   - Very sick; hospital admission necessary; active supportive treatment necessary. 10   - Moribund; fatal processes progressing rapidly. 0     - Dead  Karnofsky DA, Abelmann [place], Craver LS and [place] J[facility] [phone]) The use of the nitrogen mustards in the palliative treatment of carcinoma: with particular reference to bronchogenic carcinoma Cancer 1 634-56  LABORATORY DATA:  Lab Results  Component Value Date   WBC DUPLICATE REQUEST 96/04/5407   WBC 8.1 81/19/1478   HGB DUPLICATE REQUEST 29/56/2130   HGB 11.9* 86/57/8469   HCT DUPLICATE REQUEST 62/95/2841   HCT 35.9* 32/44/0102   MCV DUPLICATE REQUEST 72/53/6644   MCV 91.1 03/47/4259   PLT DUPLICATE REQUEST 56/38/7564   PLT 269 12/22/2014   Lab Results  Component Value Date   NA 136 12/22/2014   K 3.4* 12/22/2014   CL 98* 12/22/2014   CO2 27 12/22/2014   Lab Results  Component Value Date   ALT 18 12/22/2014   AST 20 12/22/2014   ALKPHOS 55 12/22/2014   BILITOT 1.1 12/22/2014     RADIOGRAPHY: Dg Chest 1 View  11/30/2014   CLINICAL DATA:  Status post right thoracentesis today. Postprocedural examination. EXAM: CHEST 1 VIEW COMPARISON:  CT chest and PA and lateral chest 11/29/2014. FINDINGS: Right pleural effusion is decreased after thoracentesis. No pneumothorax is identified. Very small left pleural effusion is noted. Basilar airspace disease is worse on the right. 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 IMPRESSION: Decreased right pleural effusion after thoracentesis. No pneumothorax or other new abnormality. Electronically Signed   By: Inge Rise M.D.   On: 11/30/2014 14:36  Dg Chest 2 View  12/05/2014  CLINICAL DATA:  Shortness of breath. Left lower lung resection 09/10/2013 EXAM: CHEST  2 VIEW COMPARISON:  12/01/2014 FINDINGS: Moderate size right pleural effusion. Mild right basilar atelectasis. Partial left lower lobectomy. Blunting of the left costophrenic angle likely postsurgical. No focal consolidation. No pneumothorax. Stable cardiomediastinal silhouette. The osseous structures are unremarkable. IMPRESSION: Moderate right pleural effusion which has increased compared with 12/01/2014. Electronically Signed   By: Kathreen Devoid   On: 12/05/2014 13:10   Dg Chest 2 View  12/01/2014  CLINICAL DATA:  Effusions, cough. EXAM: CHEST  2 VIEW COMPARISON:  11/30/2014 and 11/29/2014 FINDINGS: Lungs are somewhat hypoinflated with stable small to moderate right pleural effusion likely with associated atelectasis in the right base. Possible small amount left pleural fluid unchanged. Linear atelectasis left midlung. Cardiomediastinal silhouette and remainder of the exam is unchanged. IMPRESSION: Stable small to moderate right pleural effusion likely with associated basilar atelectasis. Possible small amount left pleural fluid. Electronically Signed   By: Marin Olp M.D.   On: 12/01/2014 13:00   Dg Chest 2 View  11/29/2014  CLINICAL DATA:  Shortness of breath. EXAM: CHEST  2 VIEW COMPARISON:  11/28/2014.  CT  06/21/2014 and 03/15/2014. FINDINGS: Mediastinum and hilar structures are normal. Bibasilar subsegmental atelectasis and/or scarring again noted. No significant interim change. Small bilateral effusions. Heart size stable. No pneumothorax. No acute osseous abnormality P IMPRESSION: Bibasilar atelectasis and/or infiltrates with small bilateral pleural effusions, right side greater than left. No interim change from prior exam. Electronically Signed   By: [facility]   On: 11/29/2014 14:06   Dg Chest 2 View  11/29/2014  CLINICAL DATA:  Shortness of breath for 3 days. History of lung cancer, hypertension. EXAM: CHEST  2 VIEW COMPARISON:  11/09/2013 FINDINGS: There is a moderate right pleural effusion with right lower lobe atelectasis or infiltrate. Small left pleural effusion with left base atelectasis. Heart is normal size. Mediastinal contours are within normal limits. 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 No acute bony abnormality. IMPRESSION: Bilateral pleural effusions, right greater than left. Minimal left base atelectasis. Right lower lobe atelectasis or consolidation. Electronically Signed   By: Rolm Baptise M.D.   On: 11/29/2014 08:12   Ct Angio Chest Pe W/cm &/or Wo Cm  11/29/2014  CLINICAL DATA:  PT W/ SHOB, MID CP AND ELEVATED D-DIMER H/O left lower LOBECTOMY DUE TO CANCER IN 2015 CONCERN FOR PE EXAM: CT ANGIOGRAPHY CHEST WITH CONTRAST TECHNIQUE: Multidetector CT imaging of the chest was performed using the standard protocol during bolus administration of intravenous contrast. Multiplanar CT image reconstructions and MIPs were obtained to evaluate the vascular anatomy. CONTRAST:  80 cc OMNIPAQUE IOHEXOL 350 MG/ML SOLN COMPARISON:  06/21/2014 FINDINGS: Heart: There significant coronary artery calcification. No pericardial effusion. Vascular structures: Pulmonary arteries are well opacified. No acute pulmonary embolus. There is tortuosity of the thoracic aorta. Significant  atherosclerotic calcification is present. No aneurysm. Mediastinum/thyroid: The visualized portion of the thyroid gland has a normal appearance. Right hilar density raises question of right hilar adenopathy this is difficult to assess given the presence of significant pleural effusion. There are mediastinal lymph nodes which have increased since prior study. AP window node now measures 1.4 cm short axis dimension. Subcarinal lymph node is 1.0 cm short axis dimension. Small calcified lymph node is identified the subcarinal region, stable in appearance. Lungs/Airways: Large right pleural effusion is present. There is atelectasis of the right upper and lower lobes. There is  volume loss the left lung base. No suspicious pulmonary nodules. Upper abdomen: Visualized portion of the pancreas an liver are normal.The adrenal glands are not fully imaged. Chest wall/osseous structures: There is a new superior endplate fracture T9, with approximately 30% loss of anterior height Review of the MIP images confirms the above findings. IMPRESSION: 1. Technically adequate exam.  There is no acute pulmonary embolus. 2. Significant right-sided pleural effusion and right lung atelectasis. Findings raise a question of malignant ascites. 3. Increased mediastinal adenopathy. Question of right hilar adenopathy. 4. Significant coronary artery disease. 5. Interval superior endplate fracture of T9 with 30% loss of anterior height. Electronically Signed   By: Nolon Nations M.D.   On: 11/29/2014 16:49   Mr Jeri Cos FY Contrast  12/19/2014  CLINICAL DATA:  74 year old male with generalized weakness and difficulty walking. History of lung cancer and hypertension. Initial encounter. EXAM: MRI HEAD WITHOUT AND WITH CONTRAST TECHNIQUE: Multiplanar, multiecho pulse sequences of the brain and surrounding structures were obtained without and with intravenous contrast. CONTRAST:  75m MULTIHANCE GADOBENATE DIMEGLUMINE 529 MG/ML IV SOLN COMPARISON:   No comparison brain MR or head CT. FINDINGS: Exam is motion degraded. No acute infarct. No intracranial hemorrhage. No intracranial mass or bony destructive lesion to suggest presence of intracranial metastatic disease. Evaluation slightly limited by the degree of motion. Mild small vessel disease type changes. Global atrophy without hydrocephalus. Major intracranial vascular structures are patent. Partial opacification mastoid air cells without obstructing lesion of eustachian tube noted. Minimal paranasal sinus mucosal thickening. Cervical medullary junction, pituitary region, pineal region and orbital structures unremarkable. IMPRESSION: Exam is motion degraded. No acute infarct or intracranial hemorrhage. No intracranial mass or bony destructive lesion to suggest presence of intracranial metastatic disease. Evaluation slightly limited by the degree of motion. Mild small vessel disease type changes. Global atrophy without hydrocephalus. Partial opacification mastoid air cells. Minimal paranasal sinus mucosal thickening. Electronically Signed   By: SGenia DelM.D.   On: 12/19/2014 17:37   Nm Pet Image Restag (ps) Skull Base To Thigh  12/19/2014  CLINICAL DATA:  Subsequent treatment strategy for lung cancer. Restaging examination. EXAM: NUCLEAR MEDICINE PET SKULL BASE TO THIGH TECHNIQUE: 9.3 mCi F-18 FDG was injected intravenously. Full-ring PET imaging was performed from the skull base to thigh after the radiotracer. CT data was obtained and used for attenuation correction and anatomic localization. FASTING BLOOD GLUCOSE:  Value: 82 mg/dl COMPARISON:  Chest CT 11/29/2014.  PET-CT 08/31/2013. FINDINGS: NECK Nonenlarged but hypermetabolic (SUVmax = 61.0-1.7 right-sided cervical lymph nodes (level III and level IV). CHEST Diffuse hypermetabolism throughout the right pleural space (SUVmax = 14.1-16.3), which may in part relate to malignant pleural involvement, but likely largely reflects recent talc  pleurodesis. Several small loculated pleural fluid collections are noted. There is also some patchy hypermetabolism throughout the right lung with associated marked thickening of the peribronchovascular interstitium, favored to reflect lymphangitic spread of disease throughout the right lung. Status post left lower lobectomy. Small left pleural effusion has no associated hypermetabolism, layers dependently and is simple in appearance. There are numerous borderline enlarged and enlarged markedly hypermetabolic mediastinal and bilateral lymph nodes. Specific examples include hypermetabolic left hilar lymph nodes (SUVmax = 12.8), enlarged hypermetabolic (SUVmax = 30) 1.4 cm short axis AP window lymph node and 6 mm short axis low right paratracheal lymph node that is hypermetabolic (SUVmax = 151.0, in addition to numerous others. There is also some focal thickening and hypermetabolism in the mid thoracic esophagus (SUVmax = 14.6).  Heart size is normal. There is no significant pericardial fluid, thickening or pericardial calcification. There is atherosclerosis of the thoracic aorta, the great vessels of the mediastinum and the coronary arteries, including calcified atherosclerotic plaque in the left main, left anterior descending, left circumflex and right coronary arteries. Right-sided PleurX catheter in position. There is some soft tissue thickening and hypermetabolism adjacent to the catheter entry site in the lower right anterior chest wall. In addition, there is some hypermetabolism (SUVmax = 18.9) in the musculature of the lower right hemithorax, potentially at site of prior chest tube placement. ) ABDOMEN/PELVIS Hypermetabolism (SUVmax = 10.0) in the left adrenal gland without discrete nodule suspicious for a small metastatic lesion. 7 mm celiac axis lymph node (image 103 of series 4) is markedly hypermetabolic (SUVmax = 15.4), as is a nonenlarged retroperitoneal lymph node in the left para-aortic nodal station  (image 117 of series 4) measuring 6 mm (SUVmax = 11.5). No abnormal hypermetabolic activity within the liver, pancreas, right adrenal gland or spleen. Extensive atherosclerosis throughout the abdominal and pelvic vasculature, including mild aneurysmal dilatation of the common iliac arteries bilaterally measuring 18 mm on the right and 17 mm on the left. No significant volume of ascites. No pneumoperitoneum. No pathologic distention of small bowel. SKELETON No focal hypermetabolic activity to suggest skeletal metastasis. IMPRESSION: 1. Today's study demonstrates marked progression of disease with widespread bilateral mediastinal and hilar lymphadenopathy, right cervical lymphadenopathy, malignant right pleural effusion, evidence of probable lymphangitic spread of tumor throughout the right lung, left adrenal metastasis and some nonenlarged but hypermetabolic upper abdominal and retroperitoneal lymph nodes which are presumably metastatic. 2. Focal thickening and hypermetabolism of the mid esophagus. This may reflect esophagitis, but the possibility of concurrent esophageal neoplasm is not excluded. This could be further evaluated with endoscopy if of clinical concern. 3. Extensive atherosclerosis, including left main and 3 vessel coronary artery disease, in addition to bilateral common iliac artery aneurysms, as above. 4. Right-sided PleurX catheter in position appears appropriately located. Post procedural changes of recent right-sided talc pleurodesis, as above. Electronically Signed   By: Vinnie Langton M.D.   On: 12/19/2014 16:38   Dg Chest 2v Repeat Same Day  12/05/2014  CLINICAL DATA:  Status post right-sided thoracentesis. Non-small-cell carcinoma of left lung. EXAM: CHEST  2 VIEW COMPARISON:  12/05/2014 at 1322 hours FINDINGS: 1428 hours. Midline trachea. Mild cardiomegaly. Trace left pleural fluid is similar. Decrease in small right pleural effusion. No pneumothorax. Improved right base aeration. Left  base scarring with volume loss. IMPRESSION: Decreased small right pleural effusion and improved adjacent right base airspace disease. No pneumothorax. Electronically Signed   By: Abigail Miyamoto M.D.   On: 12/05/2014 14:16   Dg Chest Port 1 View  12/22/2014  CLINICAL DATA:  Chronic severe shortness breath and cough EXAM: PORTABLE CHEST 1 VIEW COMPARISON:  December 07, 2014 FINDINGS: The mediastinal contour is normal. The heart size is enlarged. There is pulmonary edema. There are bilateral pleural effusions, left greater than right. Consolidation of left lung base is identified. There is chronic deformity of the left clavicle. IMPRESSION: Pulmonary edema. Bilateral pleural effusions, left greater than right. Consolidation of left lung base underlying pneumonia is not excluded. Electronically Signed   By: Abelardo Diesel M.D.   On: 12/22/2014 16:34   Dg Chest Port 1 View  12/07/2014  CLINICAL DATA:  preop.  Post thoracentesis. EXAM: PORTABLE CHEST 1 VIEW COMPARISON:  Radiograph 12/05/2014 FINDINGS: Normal cardiac silhouette. There are low lung volumes. There  is interval increase in the RIGHT pleural effusion. No pneumothorax. Nonunion LEFT clavicle fracture. IMPRESSION: 1. Persistent and mildly increased RIGHT pleural effusion. 2. No pneumothorax. 3. Low lung volumes. 4. Nonunion LEFT clavicle fracture. Electronically Signed   By: Suzy Bouchard M.D.   On: 12/07/2014 13:19   Dg C-arm 1-60 Min-no Report  12/07/2014  CLINICAL DATA: intra op C-ARM 1-60 MINUTES Fluoroscopy was utilized by the requesting physician.  No radiographic interpretation.   US Thoracentesis Asp Pleural Space W/img Guide  11/30/2014  INDICATION: Patient with history of non small cell left lung cancer 2015; now with dyspnea and large right pleural effusion. Request made for diagnostic/therapeutic right thoracentesis. EXAM: ULTRASOUND GUIDED DIAGNOSTIC AND THERAPEUTIC RIGHT THORACENTESIS COMPARISON:  None. MEDICATIONS: None COMPLICATIONS:  None immediate TECHNIQUE: Informed written consent was obtained from the patient after a discussion of the risks, benefits and alternatives to treatment. A timeout was performed prior to the initiation of the procedure. Initial ultrasound scanning demonstrates a large right pleural effusion. The lower chest was prepped and draped in the usual sterile fashion. 1% lidocaine was used for local anesthesia. An ultrasound image was saved for documentation purposes. A 6 Fr Safe-T-Centesis catheter was introduced. The thoracentesis was performed. The catheter was removed and a dressing was applied. The patient tolerated the procedure well without immediate post procedural complication. The patient was escorted to have an upright chest radiograph. FINDINGS: A total of approximately 1.7 liters of dark, bloody fluid was removed. Requested samples were sent to the laboratory. IMPRESSION: Successful ultrasound-guided diagnostic/therapeutic right sided thoracentesis yielding 1.7 liters of pleural fluid. Read by:  Rowe Robert, [facility] Electronically Signed   By: Sandi Mariscal M.D.   On: 11/30/2014 14:35     I personally reviewed the patient's imaging studies and presented them to his daughter with attention to the airways and esophagus.     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 He does have visible subcarinal adenopathy on CT which may be contributing to his symptoms. With diffuse pleural tumor, comprehensive radiation treatment is not feasible. However, localized radiation to the lower mediastinum may help provide some relief of dysphagia as well as improvement in airway patency.   PLAN: Today, I talked to the patient and family about the findings and work-up thus far.  We discussed the natural history of non small cell carcinoma recurrence and general treatment, highlighting the role of radiotherapy in the management.  We  discussed the available radiation techniques, and focused on the details of logistics and delivery.  We reviewed the anticipated acute and late sequelae associated with radiation in this setting.  The patient was encouraged to ask questions that I answered to the best of my ability.  I filled out a patient counseling form during our discussion including treatment diagrams.  We retained a copy for our records.  The patient would like to proceed with radiation and will be scheduled for CT simulation.  I spent 30 minutes minutes face to face with the patient and more than 50% of that time was spent in counseling and/or coordination of care.   ------------------------------------------------  Sheral Apley. Tammi Klippel, M.D.      This document serves as a record of services personally performed by Tyler Pita, MD. It was created on his behalf by Arlyce Harman, a trained medical scribe. The creation of this record is based on the scribe's personal observations and the provider's statements  to them. This document has been checked and approved by the attending provider.

## 2014-12-23 NOTE — Op Note (Addendum)
[facility] [place], [address]   OPERATIVE PROCEDURE REPORT - 12/23/2014  patient:  Mariano, Doshi        id:  [id number] birthdate:  [birth year]      gender: male endoscopist:  Ladene Artist, MD, [facility]     assistant:  Cletis Athens, Endo Technician Dortha Schwalbe, RN Tory Emerald  procedure:     1.  EGD with PEG placement  indications:     1.  dysphagia. 2.  weight loss. 3. malnutrition   medications:  Monitored anesthesia care and Per Anesthesia topical anesthetic:  none  description of procedure: After the risks, benefits, and alternatives of the procedure were thoroughly explained, informed consent was obtained.  The    adult and then pediatric endoscope was introduced through the mouth and advanced to the second portion of the duodenum.  The instrument was slowly withdrawn as the mucosa was fully examined.  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  The stricture was not traversable with the standard adult endoscope. The stricture was traversed with a pediatric endoscope without resistance.  The esophagus otherwise appeared normal. DUODENUM: The duodenal mucosa showed no abnormalities in the bulb and 2nd part of the duodenum. STOMACH: The mucosa of the stomach appeared normal.  The stomach was then inflated with air, and by a combination of transillumination and manual palpation, the site for the gastrostomy tube placement was selected and marked on the anterior abdominal wall.  The skin of the anterior abdomen was surgically prepped and draped with sterile towels.  Utilizing strict sterile technique, the selected site was then anesthetized with 1% xylocaine by injection into the skin and subcutaneous tissue.  A 1 cm incision was made through the skin and subcutaneous tissue, and the needle/cannula assembly was then passed through the abdominal wall and through  the anterior wall of the stomach, maintaining visualization with the endoscope.  A pediatric biopsy forceps previously placed through the instrument channel was then opened and placed to grasp the cannula, the needle was removed, and the insertion wire was passed through the cannula and into the stomach lumen.  The biopsy forceps was then opened from the cannula, and repositioned to grasp the insertion wire. The forceps was then pulled up to the endoscope distal tip, and the scope was then withdrawn bringing with it the forceps and insertion wire.  The insertion wire was then released from the forceps, and then loop-attached to the [employer] 24 Fr gastrostomy tube. Using the "pull technique", the G-tube was then pulled into place by traction on the insertion wire at the abdominal wall end. The G-tube insertion site was then cleansed once again, and the external bolster was placed over the tube to secure it to the abdominal wall.  A sterile dressing was then applied, and the procedure terminated.  complications:  There were no immediate complications.  endoscopic impressions:      1.  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  PEG placed  e)  recommendations:      Post PEG instructions    Ladene Artist, MD, [facility]    PATIENT NAME:  Jayen, Bromwell MR#: [id number]

## 2014-12-23 NOTE — Progress Notes (Signed)
[facility] Progress Note  Subjective:  Breathing improved some today; about as good as it will get for the patient.  He does not have any complaints.  Daughter is present and situation discussed with her.  Objective:  Vital signs in last 24 hours: Temp:  [97.4 F (36.3 C)-97.8 F (36.6 C)] 97.4 F (36.3 C) (12/02 0609) Pulse Rate:  [96-110] 96 (12/02 0609) Resp:  [17-18] 17 (12/02 0609) BP: (104-134)/(59-79) 134/79 mmHg (12/02 0609) SpO2:  [85 %-96 %] 96 % (12/02 0609)   General:  Alert, Well-developed, in NAD Heart:  Regular rate and rhythm; no murmurs Pulm:  Decreased BS noted.  Wheezing improved from yesterday. Abdomen:  Soft, non-distended. Normal bowel sounds.  Non-tender.  Pleurex catheter noted on right epigastrium. Extremities:  Without edema. Neurologic:  Alert and oriented 4;  grossly normal neurologically. Psych:  Alert and cooperative. Normal mood and affect.  Intake/Output from previous day: 12/01 0701 - 12/02 0700 In: -  Out: 750 [Urine:450; Chest Tube:300]  Lab Results:  Recent Labs  12/20/14 1102 12/22/14 6378  WBC 58.8* DUPLICATE REQUEST  8.1  HGB 50.2* DUPLICATE REQUEST  77.4*  HCT 12.8* DUPLICATE REQUEST  78.6*  PLT 767 DUPLICATE REQUEST  209   BMET  Recent Labs  12/20/14 1102 12/22/14 1550  NA 135* 136  K 3.9 3.4*  CL  --  98*  CO2 29 27  GLUCOSE 98 80  BUN 14.5 14  CREATININE 0.9 0.96  1.02  CALCIUM 9.6 8.8*   LFT  Recent Labs  12/22/14 1550  PROT 6.2*  ALBUMIN 2.9*  AST 20  ALT 18  ALKPHOS 55  BILITOT 1.1   Dg Chest Port 1 View  12/22/2014  CLINICAL DATA:  Chronic severe shortness breath and cough EXAM: PORTABLE CHEST 1 VIEW COMPARISON:  December 07, 2014 FINDINGS: The mediastinal contour is normal. The heart size is enlarged. There is pulmonary edema. There are bilateral pleural effusions, left greater than right. Consolidation of left lung base is identified. There is chronic deformity of the left  clavicle. IMPRESSION: Pulmonary edema. Bilateral pleural effusions, left greater than right. Consolidation of left lung base underlying pneumonia is not excluded. Electronically Signed   By: Abelardo Diesel M.D.   On: 12/22/2014 16:34   Assessment / Plan: -Dysphagia, progressive over the past 1-2 weeks but particularly worse for the past 3-4 days: PET scan showing abnormalities in/around the esophagus. Oncology requesting EGD to assess. -Non-small cell lung cancer, recurrent, extensive throughout his chest: Respiratory status still suboptimal but significantly improved today and probably about as good as he will get.   *Dr. Fuller Plan discussed with the patient's daughter who is an anesthesiologist in [place].  After long discussion it was decided that we will attempt EGD with very light sedation, which the daughter thinks he will tolerate well.  Will also plan to attempt PEG placement as well unless some type of infectious esophagitis is found, which is unlikely; suspect that this is extrinsic compression from malignancy.  Daughter said that her father tolerates pain very well and is very stoic; she thinks that if she is present for the procedure or if interpreter is there to talk him through since he will only be lightly sedated then he can tolerate it.   LOS: 1 day   ZEHR, JESSICA D.  12/23/2014, 8:57 AM  Pager number [phone]     Attending physician's note   I have taken an interval history, reviewed the chart and examined  the patient. I agree with the Advanced Practitioner's note, impression and recommendations. Respiratory status significantly improved and although not optimal it is unlikely to further improve without treatment of his lung cancer. Dr Julien Nordmann and pts daughter requesting EGD and PEG placement today for dysphagia and weight loss with anticipated need for ongoing nutritional support. His daughter, an anesthesiologist, interpreted for her father. Pts daughter feels that he could  tolerate procedure related discomfort, as would be expected with light sedation, if someone explained each step during the procedure. She, or an interpreter, will stay for procedure to translate during procedure.  They understand he is at an increased risk for procedure and sedation related complications and they consent proceed. EGD with light sedation and possible PEG placement.   Lucio Edward, MD [facility] [phone] Mon-Fri 8a-5p [phone] after 5p, weekends, holidays

## 2014-12-23 NOTE — Progress Notes (Signed)
Initial Nutrition Assessment  DOCUMENTATION CODES:   Not applicable  INTERVENTION:   Diet advancement per MD  Monitor K, Mg, Phos for at least 3 days, MD replete as needed, pt is currently at refeeding risk with K 3.4 No Mg or Phos on file.  S/P PEG placement, tubefeeding recommendations:   Vital 1.5 goal rate of 47 ml/hr, begin at 20, increase by 10 every 12 hours to goal.  Pro-Stat 66m x2  Together, provides:  1900 Calories  106.5g Pro  865cc Free h2o   NUTRITION DIAGNOSIS:   Inadequate oral intake related to inability to eat as evidenced by NPO status.  GOAL:   Patient will meet greater than or equal to 90% of their needs   MONITOR:   Diet advancement, Labs, Weight trends, Skin, I & O's  REASON FOR ASSESSMENT:   Malnutrition Screening Tool    ASSESSMENT:   74year old [place] with history of Recurrent non-small cell lung cancer, adenocarcinoma with negative EGFR mutation and negative ALK gene translocation initially diagnosed as Stage IB (T2a, N0, M0) non-small cell lung cancer consistent with invasive adenocarcinoma with visceral pleural invasion diagnosed in August 2015.  Pt was in ENDO at the time of visit. Unable to complete RD Exam at this time. [facility]  Since previous hospitalization, patient has become substantially more acute. NSCLC which was thought to be in remission has now spread to his lymph nodes and adrenal glands (Stg IV). He is also suffering from dysphagia, caused by mid esophageal thickening s/p PET scan. Likely related to worsening of disease.  Pt exhibits 10#/5.5% severe wt loss in 3 weeks. Currently receiving a PEG for ntr support.  When consulted, provide tube feeding recommendations listed above.  Follow for tolerance.  Diet Order:  Diet NPO time specified  Skin:  Reviewed, no issues  Last BM:  PTA  Height:   Ht Readings from Last 1 Encounters:   12/20/14 5' 6"  (1.676 m)    Weight:   Wt Readings from Last 1 Encounters:  12/20/14 175 lb 3.2 oz (79.47 kg)    Ideal Body Weight:  64.5 kg  BMI:  There is no weight on file to calculate BMI.  Estimated Nutritional Needs:   Kcal:  1700-1900  Protein:  85-100 grams  Fluid:  1.7 - 1.9L  EDUCATION NEEDS:   No education needs identified at this time  Brandon Anis Kelechi Orgeron, MS, RD LDN After Hours/Weekend Pager [phone]

## 2014-12-23 NOTE — Telephone Encounter (Signed)
I returned Natalia's call and left a message that I will give message to [facility] who will be in monday

## 2014-12-24 DIAGNOSIS — E8809 Other disorders of plasma-protein metabolism, not elsewhere classified: Secondary | ICD-10-CM

## 2014-12-24 DIAGNOSIS — J910 Malignant pleural effusion: Secondary | ICD-10-CM

## 2014-12-24 DIAGNOSIS — K222 Esophageal obstruction: Secondary | ICD-10-CM

## 2014-12-24 DIAGNOSIS — R590 Localized enlarged lymph nodes: Secondary | ICD-10-CM

## 2014-12-24 DIAGNOSIS — E46 Unspecified protein-calorie malnutrition: Secondary | ICD-10-CM | POA: Diagnosis present

## 2014-12-24 LAB — GLUCOSE, CAPILLARY
GLUCOSE-CAPILLARY: 134 mg/dL — AB (ref 65–99)
GLUCOSE-CAPILLARY: 113 mg/dL — AB (ref 65–99)
Glucose-Capillary: 133 mg/dL — ABNORMAL HIGH (ref 65–99)

## 2014-12-24 LAB — CREATININE, SERUM
Creatinine, Ser: 1.05 mg/dL (ref 0.61–1.24)
GFR calc Af Amer: >60 mL/min
GFR calc non Af Amer: >60 mL/min

## 2014-12-24 MED ORDER — PANTOPRAZOLE SODIUM 40 MG PO PACK
40.0000 mg | PACK | Freq: Two times a day (BID) | ORAL | Status: DC
  Administered 2014-12-24 – 2014-12-25 (×2): 40 mg
  Filled 2014-12-24 (×3): qty 20

## 2014-12-24 MED ORDER — VITAL 1.5 CAL PO LIQD
1000.0000 mL | ORAL | Status: DC
  Administered 2014-12-24 – 2014-12-25 (×2): 1000 mL
  Filled 2014-12-24 (×2): qty 1000

## 2014-12-24 MED ORDER — VITAL HIGH PROTEIN PO LIQD: 1000.0000 mL | ORAL | Status: DC

## 2014-12-24 MED ORDER — METHYLPREDNISOLONE SODIUM SUCC 125 MG IJ SOLR
60.0000 mg | Freq: Two times a day (BID) | INTRAMUSCULAR | Status: DC
Start: 2014-12-24 — End: 2014-12-25
  Administered 2014-12-24 – 2014-12-25 (×2): 60 mg via INTRAVENOUS
  Filled 2014-12-24 (×2): qty 2

## 2014-12-24 MED ORDER — PRO-STAT SUGAR FREE PO LIQD
30.0000 mL | Freq: Two times a day (BID) | ORAL | Status: DC
  Administered 2014-12-24 – 2014-12-26 (×5): 30 mL
  Filled 2014-12-24 (×5): qty 30

## 2014-12-24 MED ORDER — POTASSIUM CHLORIDE CRYS ER 20 MEQ PO TBCR: 40.0000 meq | EXTENDED_RELEASE_TABLET | ORAL | Status: DC | PRN

## 2014-12-24 MED ORDER — POTASSIUM CHLORIDE 20 MEQ/15ML (10%) PO SOLN
40.0000 meq | Freq: Once | ORAL | Status: AC
  Administered 2014-12-24: 40 meq
  Filled 2014-12-24: qty 30

## 2014-12-24 MED ORDER — LEVOTHYROXINE SODIUM 25 MCG PO TABS
25.0000 ug | ORAL_TABLET | Freq: Every day | ORAL | Status: DC
  Administered 2014-12-25 – 2014-12-26 (×2): 25 ug
  Filled 2014-12-24 (×2): qty 1

## 2014-12-24 NOTE — Progress Notes (Signed)
HEMATOLOGY-ONCOLOGY PROGRESS NOTE X-COVER  SUBJECTIVE: Abdominal discomfort is better today. He reports that his breathing is also mildly better. He has seen radiation oncology who plan to do palliative radiation starting Monday. He also plans to see Dr. Julien Nordmann and start immunotherapy next week. Upper endoscopy was performed which did not reveal any internal esophageal abnormalities. His dysphagia was felt to be related to extrinsic compression.  OBJECTIVE: PHYSICAL EXAMINATION: ECOG PERFORMANCE STATUS: 3 - Symptomatic, >50% confined to bed  Filed Vitals:   12/23/14 2255 12/24/14 0443  BP: 129/76 148/75  Pulse: 103 89  Temp: 97.5 F (36.4 C) 97.5 F (36.4 C)  Resp: 20 18   There were no vitals filed for this visit.  GENERAL:alert, no distress and comfortable EYES: normal, Conjunctiva are pink and non-injected, sclera clear OROPHARYNX:no exudate, no erythema and lips, buccal mucosa, and tongue normal  NECK: supple, thyroid normal size, non-tender, without nodularity LYMPH:  no palpable lymphadenopathy in the cervical, axillary or inguinal LUNGS: Right lung wheezing HEART: regular rate & rhythm and no murmurs and no lower extremity edema ABDOMEN: PEG tube is in place Musculoskeletal:no cyanosis of digits and no clubbing  NEURO: alert & oriented x 3 with fluent speech, no focal motor/sensory deficits  LABORATORY DATA:  I have reviewed the data as listed CMP Latest Ref Rng 12/24/2014 12/22/2014 12/22/2014  Glucose 65 - 99 mg/dL - 80 -  BUN 6 - 20 mg/dL - 14 -  Creatinine 0.61 - 1.24 mg/dL 1.05 0.96 1.02  Sodium 135 - 145 mmol/L - 136 -  Potassium 3.5 - 5.1 mmol/L - 3.4(L) -  Chloride 101 - 111 mmol/L - 98(L) -  CO2 22 - 32 mmol/L - 27 -  Calcium 8.9 - 10.3 mg/dL - 8.8(L) -  Total Protein 6.5 - 8.1 g/dL - 6.2(L) -  Total Bilirubin 0.3 - 1.2 mg/dL - 1.1 -  Alkaline Phos 38 - 126 U/L - 55 -  AST 15 - 41 U/L - 20 -  ALT 17 - 63 U/L - 18 -    Lab Results  Component Value Date   WBC DUPLICATE REQUEST 93/71/6967   WBC 8.1 89/38/1017   HGB DUPLICATE REQUEST 51/02/5850   HGB 11.9* 77/82/4235   HCT DUPLICATE REQUEST 36/14/4315   HCT 35.9* 40/08/6759   MCV DUPLICATE REQUEST 95/09/3265   MCV 91.1 12/45/8099   PLT DUPLICATE REQUEST 83/38/2505   PLT 269 12/22/2014   NEUTROABS 5.6 12/22/2014    ASSESSMENT AND PLAN: 1. 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 Dysphagia related to extrinsic compression on the esophagus: Palliative radiation to start Monday. 3. Shortness of breath related to malignant pleural effusion as well as lymphadenopathy causing some degree of airway compromise.  4. 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 Hypoalbuminemia: Related to protein calorie malnutrition: He now has a PEG tube which is going to be used for tube feeds. Please noticed it and call us if there are any questions or concerns.

## 2014-12-24 NOTE — Progress Notes (Signed)
PHARMACIST - PHYSICIAN COMMUNICATION DR:   Dhungel CONCERNING: IV to Oral Route Change Policy  RECOMMENDATION: This patient is receiving synthroid and protonix by the intravenous route.  Based on criteria approved by the Pharmacy and Therapeutics Committee, the intravenous medication(s) is/are being converted to the equivalent oral dose form(s).  - s/p EGD with PEG placement on 12/2 for mass obstructing mid esophagus - Dr. Ardis Hughs from GI indicated in note that it's ok to use PEG for TF and meds - Will change the above two meds to via tube  If you have questions about this conversion, please contact the Pharmacy Department  '[]'$   [phone] )  [facility] '[]'$   [phone] )  [facility] '[]'$   [phone] )  [facility] '[]'$   [phone] )  [facility] '[x]'$   [phone] )  [place], [place], [facility] 12/24/2014 11:21 AM

## 2014-12-24 NOTE — Progress Notes (Signed)
Pleurex drained per order, removed 200cc of red fluid. Patient tolerated procedure well. Clean dressing applied.

## 2014-12-24 NOTE — Progress Notes (Signed)
[facility] Progress Note    Since last GI note: EGD with PEG placement yesterday for extrinsic mass that is obstructing the mid esophagus.  Objective: Vital signs in last 24 hours: Temp:  [97.5 F (36.4 C)-98.2 F (36.8 C)] 97.5 F (36.4 C) (12/03 0443) Pulse Rate:  [89-110] 89 (12/03 0443) Resp:  [18-27] 18 (12/03 0443) BP: (123-148)/(70-79) 148/75 mmHg (12/03 0443) SpO2:  [92 %-95 %] 94 % (12/03 0443)   General: alert and oriented times 3 Heart: regular rate and rythm Abdomen: soft, non-tender, non-distended, normal bowel sounds PEG site bandage is dry. The PEG external bumper is in good position.  Lab Results:  Recent Labs  46/56/81 2751  WBC DUPLICATE REQUEST  8.1  HGB DUPLICATE REQUEST  70.0*  PLT DUPLICATE REQUEST  174  MCV DUPLICATE REQUEST  94.4    Recent Labs  12/22/14 1550 12/24/14 0400  NA 136  --   K 3.4*  --   CL 98*  --   CO2 27  --   GLUCOSE 80  --   BUN 14  --   CREATININE 0.96  1.02 1.05  CALCIUM 8.8*  --     Recent Labs  12/22/14 1550  PROT 6.2*  ALBUMIN 2.9*  AST 20  ALT 18  ALKPHOS 55  BILITOT 1.1   Studies/Results: Dg Chest Port 1 View  12/22/2014  CLINICAL DATA:  Chronic severe shortness breath and cough EXAM: PORTABLE CHEST 1 VIEW COMPARISON:  December 07, 2014 FINDINGS: The mediastinal contour is normal. The heart size is enlarged. There is pulmonary edema. There are bilateral pleural effusions, left greater than right. Consolidation of left lung base is identified. There is chronic deformity of the left clavicle. IMPRESSION: Pulmonary edema. Bilateral pleural effusions, left greater than right. Consolidation of left lung base underlying pneumonia is not excluded. Electronically Signed   By: Abelardo Diesel M.D.   On: 12/22/2014 16:34     Medications: Scheduled Meds: . albuterol  2.5 mg Nebulization TID  . enoxaparin (LOVENOX) injection  40 mg Subcutaneous Q24H  . fluticasone  2 spray Each Nare Daily  .  levofloxacin (LEVAQUIN) IV  750 mg Intravenous Q24H  . levothyroxine  12.5 mcg Intravenous Daily  . methylPREDNISolone (SOLU-MEDROL) injection  60 mg Intravenous Q12H  . pantoprazole (PROTONIX) IV  40 mg Intravenous Q12H  . tobramycin-dexamethasone  2 drop Both Eyes Q4H while awake  . triamterene-hydrochlorothiazide  1 tablet Oral q morning - 10a  . Umeclidinium-Vilanterol  1 Act Inhalation Daily   Continuous Infusions: . dextrose 5 % and 0.9% NaCl     PRN Meds:.acetaminophen **OR** acetaminophen, albuterol, morphine injection, ondansetron **OR** ondansetron (ZOFRAN) IV    Assessment/Plan: 74 y.o. male with metastatic NSCLC causing malignant, extrinsic compression of the mid esopahgus, s/p PEG yesterday  The PEG site looks good. This device is OK to use for TFs and meds.  Please call or page with any further questions or concerns.     Brandon Banister, MD  12/24/2014, 7:47 AM [facility] Pager [phone]

## 2014-12-24 NOTE — Progress Notes (Signed)
[facility] PROGRESS NOTE  Fenton Candee KKX:[id number] DOB: 1940/02/07 DOA: 12/22/2014 PCP: [name], MD  Brief narrative 74 year old recent speaking male with recurrent non-small cell lung cancer (adenocarcinoma status post lobectomy in August 2015 followed by adjuvant systemic chemotherapy, now with significant disease progression with recurrent pleural effusion requiring) catheter placement 2 weeks back. Patient presenting with  progressive dyspnea and dysphagia worsened over the past 1-2 week. Recent PET scan showing thickening around mid esophagus possible for tumor spread with extrinsic compression. Admitted to hospitalist service and [place] consulted.  Assessment/Plan: Dysphagia Secondary to extrinsic esophageal compression as seen on recent PET scan with mid esophageal thickening.  -Kept nothing by mouth. GI consulted and patient underwent EGD showing focal stricture secondary to extrinsic compression in the mid esophagus and required a pediatric endoscope to traverse without resistance. The stomach and duodenum were normal. Patient had a PEG tube placed in for nutrition given his esophageal stricture and planned immunotherapy. -Tolerated procedure well. Will start on G-tube feeds today.  Dyspnea with Acute hypoxic respiratory failure Patient extremely dyspneic and wheezy on presentation. Appears to be associated with progressive lung cancer with pulmonary edema. Chest x-ray shows bilateral pleural effusion possible left  lung base consolidation. Responded to IV Solu-Medrol, scheduled nebs and her dose of Lasix. Added empiric Levaquin.  had 300 mL of pleural fluid removed from his right Pleurx catheter on admission and 200 mL today. (At home patient has up to 300 mL removed every other day). -Dyspnea much improved. Continue when necessary Lasix. Will discontinue IV fluids.  Essential hypertension Resume home blood pressure medications.  History of a flutter As per  daughter this was transient after his lobectomy. Patient is not on anticoagulation. He is currently in sinus rhythm.    Non-small cell carcinoma of right lung, stage 4 ([place]) This is progressed with widespread metastases. Dr. Julien Nordmann as discussed options for hospice versus immunotherapy with the patient and patient inclined towards starting immunotherapy. Scheduled to be started on 12/27/2014. Dr. Elmer Bales following.  Protein calorie malnutrition Starting tube feed today.  Hypothyroidism Resume home oral Synthroid  Hypokalemia Mild. Replenish   DVT prophylaxis: Subcutaneous Lovenox Diet: Nothing by mouth  Code Status: Full code Family Communication: none at bedside Disposition Plan: Continue inpatient monitoring   Consultants:  [facility]  Dr. Julien Nordmann  Procedures:  EGD with PEG placement on 12/2  Antibiotics:  Levaquin 12/1-  HPI/Subjective: Seen and examined. Reports mild soreness around PEG tube site. No overnight issues. Shortness of breath much better.  Objective: Filed Vitals:   12/23/14 2255 12/24/14 0443  BP: 129/76 148/75  Pulse: 103 89  Temp: 97.5 F (36.4 C) 97.5 F (36.4 C)  Resp: 20 18    Intake/Output Summary (Last 24 hours) at 12/24/14 1123 Last data filed at 12/24/14 1109  Gross per 24 hour  Intake 1202.4 ml  Output    900 ml  Net  302.4 ml   There were no vitals filed for this visit.  Exam:   General:  Elderly male not in distress  HEENT: No pallor, dry oral mucosa, supple neck  Chest: Diminished bibasilar breath sounds, no added sounds, right-sided Pleurx catheter  Cardiovascular: Normal S1 and S2, no murmurs rub or gallop  GI: Soft, nondistended, nontender, PEG tube site clean, bowel sounds present  Musculoskeletal: Warm, no edema  CNS: Alert and oriented,  Data Reviewed: Basic Metabolic Panel:  Recent Labs Lab 12/20/14 1102 12/22/14 1550 12/24/14 0400  NA 135* 136  --  K 3.9 3.4*  --   CL  --  98*  --   CO2 29  27  --   GLUCOSE 98 80  --   BUN 14.5 14  --   CREATININE 0.9 0.96  1.02 1.05  CALCIUM 9.6 8.8*  --    Liver Function Tests:  Recent Labs Lab 12/20/14 1102 12/22/14 1550  AST 16 20  ALT 17 18  ALKPHOS 65 55  BILITOT 0.54 1.1  PROT 6.5 6.2*  ALBUMIN 2.7* 2.9*   No results for input(s): LIPASE, AMYLASE in the last 168 hours. No results for input(s): AMMONIA in the last 168 hours. CBC:  Recent Labs Lab 12/20/14 1102 12/22/14 5789  WBC 78.4* DUPLICATE REQUEST  8.1  NEUTROABS 8.1* 5.6  HGB 78.4* DUPLICATE REQUEST  12.8*  HCT 20.8* DUPLICATE REQUEST  13.8*  MCV 87.1 DUPLICATE REQUEST  95.9  PLT 747 DUPLICATE REQUEST  185   Cardiac Enzymes: No results for input(s): CKTOTAL, CKMB, CKMBINDEX, TROPONINI in the last 168 hours. BNP (last 3 results)  Recent Labs  12/22/14 1550  BNP 82.8    ProBNP (last 3 results) No results for input(s): PROBNP in the last 8760 hours.  CBG:  Recent Labs Lab 12/19/14 1356  GLUCAP 82    No results found for this or any previous visit (from the past 240 hour(s)).   Studies: Dg Chest Port 1 View  12/22/2014  CLINICAL DATA:  Chronic severe shortness breath and cough EXAM: PORTABLE CHEST 1 VIEW COMPARISON:  December 07, 2014 FINDINGS: The mediastinal contour is normal. The heart size is enlarged. There is pulmonary edema. There are bilateral pleural effusions, left greater than right. Consolidation of left lung base is identified. There is chronic deformity of the left clavicle. IMPRESSION: Pulmonary edema. Bilateral pleural effusions, left greater than right. Consolidation of left lung base underlying pneumonia is not excluded. Electronically Signed   By: Abelardo Diesel M.D.   On: 12/22/2014 16:34    Scheduled Meds: . albuterol  2.5 mg Nebulization TID  . enoxaparin (LOVENOX) injection  40 mg Subcutaneous Q24H  . feeding supplement (PRO-STAT SUGAR FREE 64)  30 mL Per Tube BID  . feeding supplement (VITAL 1.5 CAL)  1,000 mL Per  Tube Q24H  . fluticasone  2 spray Each Nare Daily  . levofloxacin (LEVAQUIN) IV  750 mg Intravenous Q24H  . levothyroxine  12.5 mcg Intravenous Daily  . methylPREDNISolone (SOLU-MEDROL) injection  60 mg Intravenous Q12H  . pantoprazole (PROTONIX) IV  40 mg Intravenous Q12H  . tobramycin-dexamethasone  2 drop Both Eyes Q4H while awake  . triamterene-hydrochlorothiazide  1 tablet Oral q morning - 10a  . Umeclidinium-Vilanterol  1 Act Inhalation Daily   Continuous Infusions: . dextrose 5 % and 0.9% NaCl        Time spent: 25 minutes    Mackenzee Becvar, [place]  [facility] Pager [phone]. If 7PM-7AM, please contact night-coverage at [email or web address], password [facility] 12/24/2014, 11:23 AM  LOS: 2 days

## 2014-12-24 NOTE — Progress Notes (Signed)
Brief Nutrition Note  Consult received for enteral/tube feeding initiation and management. Will place recs that meet estimated needs.  Pt was seen by RD yesterday. Deemed by assessing RD to be at refeeding risk and was reccommended to monitor K, Mg, phos x 3 days. Replete as needed.   Adult Enteral Nutrition Protocol initiated. Full assessment to follow.  Admitting Dx: increased short of breath dehydration Dysphagia; abnormal PET scan; [place] lung cancer Dysphagia  Labs:   Recent Labs Lab 12/20/14 1102 12/22/14 1550 12/24/14 0400  NA 135* 136  --   K 3.9 3.4*  --   CL  --  98*  --   CO2 29 27  --   BUN 14.5 14  --   CREATININE 0.9 0.96  1.02 1.05  CALCIUM 9.6 8.8*  --   GLUCOSE 98 80  --    Burtis Junes RD, LDN Nutrition Pager: [phone] 12/24/2014 8:10 AM

## 2014-12-25 LAB — GLUCOSE, CAPILLARY
GLUCOSE-CAPILLARY: 139 mg/dL — AB (ref 65–99)
GLUCOSE-CAPILLARY: 76 mg/dL (ref 65–99)
Glucose-Capillary: 136 mg/dL — ABNORMAL HIGH (ref 65–99)
Glucose-Capillary: 123 mg/dL — ABNORMAL HIGH (ref 65–99)
Glucose-Capillary: 141 mg/dL — ABNORMAL HIGH (ref 65–99)
Glucose-Capillary: 124 mg/dL — ABNORMAL HIGH (ref 65–99)

## 2014-12-25 MED ORDER — VITAL 1.5 CAL PO LIQD
1000.0000 mL | ORAL | Status: DC
  Administered 2014-12-26: 1000 mL
  Filled 2014-12-25 (×2): qty 1000

## 2014-12-25 MED ORDER — PREDNISONE 20 MG PO TABS
40.0000 mg | ORAL_TABLET | Freq: Every day | ORAL | Status: DC
  Administered 2014-12-26: 40 mg via ORAL
  Filled 2014-12-25: qty 2

## 2014-12-25 MED ORDER — FUROSEMIDE 10 MG/ML IJ SOLN
40.0000 mg | Freq: Once | INTRAMUSCULAR | Status: AC
  Administered 2014-12-25: 40 mg via INTRAVENOUS
  Filled 2014-12-25: qty 4

## 2014-12-25 NOTE — Evaluation (Signed)
Physical Therapy Evaluation Patient Details Name: Brandon Newton MRN: [id number] DOB: 27-Jan-1940 Today's Date: 12/25/2014   History of Present Illness  74 year old [place] speaking male with recurrent non-small cell lung cancer (adenocarcinoma status post lobectomy in August 2015 followed by adjuvant systemic chemotherapy, now with significant disease progression with recurrent pleural effusion requiring) catheter placement 2 weeks back. Patient admitted with dyspnea with acute hypoxic respiratory failure and dysphagia worsened over the past 1-2 week. Recent PET scan showing thickening around mid esophagus possible for tumor spread with extrinsic compression. Pt had a PEG tube placed on 12/23/14; getting ready to start immunotherapy on 12/27/14.   Clinical Impression  Pt admitted with above diagnosis. Pt currently with functional limitations due to the deficits listed below (see PT Problem List). Pt will benefit from skilled PT to increase their independence and safety with mobility to allow discharge to the venue listed below. Pt was not able to ambulate today d/t weakness and DOE, required min guard assist for transfers and desaturated to 82%. Pt's wife states they have all necessary equipment at home, pt has been on home oxygen for 1 month. Pt is not at baseline and will benefit from HHPT for continuous rehab.        Follow Up Recommendations Home health PT    Equipment Recommendations  None recommended by PT    Recommendations for Other Services       Precautions / Restrictions Precautions Precaution Comments: has been on home O2 for about a month, has pulse ox at home, monitor sats      Mobility  Bed Mobility Overal bed mobility: Modified Independent             General bed mobility comments: requires rest break once seated EOB  Transfers Overall transfer level: Needs assistance Equipment used: None Transfers: Sit to/from Stand;Stand Pivot Transfers Sit to Stand: Min  guard Stand pivot transfers: Min guard       General transfer comment: min/guard for lines, pt only wished to get OOB to chair today due to fatigue, SpO2 dropped to 82% on 3L O2 after transferring however quickly improved to 91% within 1-2 minutes  Ambulation/Gait                Stairs            Wheelchair Mobility    Modified Rankin (Stroke Patients Only)       Balance                                             Pertinent Vitals/Pain Pain Assessment: No/denies pain    Home Living Family/patient expects to be discharged to:: Private residence Living Arrangements: Spouse/significant other;Children   Type of Home: House Home Access: Stairs to enter Entrance Stairs-Rails: Right;Left;Can reach both Technical brewer of Steps: 6 Home Layout: Able to live on main level with bedroom/bathroom Home Equipment: Walker - 2 wheels;Cane - single point      Prior Function Level of Independence: Independent               Hand Dominance        Extremity/Trunk Assessment   Upper Extremity Assessment: Generalized weakness           Lower Extremity Assessment: Generalized weakness      Cervical / Trunk Assessment: Normal  Communication   Communication: HOH;Prefers language other than [place] Medical illustrator  PT speaks [place] and interpreted)  Cognition Arousal/Alertness: Awake/alert Behavior During Therapy: WFL for tasks assessed/performed Overall Cognitive Status: Within Functional Limits for tasks assessed                      General Comments      Exercises        Assessment/Plan    PT Assessment Patient needs continued PT services  PT Diagnosis Difficulty walking   PT Problem List Decreased strength;Decreased activity tolerance;Decreased mobility;Cardiopulmonary status limiting activity  PT Treatment Interventions DME instruction;Gait training;Functional mobility training;Patient/family education;Stair  training;Therapeutic activities;Therapeutic exercise   PT Goals (Current goals can be found in the Care Plan section) Acute Rehab PT Goals Patient Stated Goal: to go home PT Goal Formulation: With patient/family Time For Goal Achievement: 01/01/15 Potential to Achieve Goals: Good    Frequency Min 3X/week   Barriers to discharge        Co-evaluation               End of Session Equipment Utilized During Treatment: Oxygen Activity Tolerance: Patient limited by fatigue Patient left: in chair;with call bell/phone within reach;with family/visitor present           Time: 1337-1410 PT Time Calculation (min) (ACUTE ONLY): 33 min   Charges:   PT Evaluation $Initial PT Evaluation Tier I: 1 Procedure     PT G Codes:        Stephany Poorman 01-24-15, 4:03 PM [employer], SPT 01/24/15

## 2014-12-25 NOTE — Progress Notes (Signed)
HEMATOLOGY-ONCOLOGY PROGRESS NOTE  SUBJECTIVE: Abdomen better. On Tube feeds  OBJECTIVE: PHYSICAL EXAMINATION: ECOG PERFORMANCE STATUS: 2 - Symptomatic, <50% confined to bed  Filed Vitals:   12/24/14 2028 12/25/14 0443  BP: 127/77 122/78  Pulse: 107 85  Temp: 98 F (36.7 C) 97.9 F (36.6 C)  Resp: 18 18   Filed Weights   12/25/14 0443  Weight: 180 lb 9.6 oz (81.92 kg)    GENERAL:alert, no distress and comfortable SKIN: skin color, texture, turgor are normal, no rashes or significant lesions EYES: normal, Conjunctiva are pink and non-injected, sclera clear OROPHARYNX:no exudate, no erythema and lips, buccal mucosa, and tongue normal  NECK: supple, thyroid normal size, non-tender, without nodularity LYMPH:  no palpable lymphadenopathy in the cervical, axillary or inguinal LUNGS: Pleurex catheter, Wheezing better HEART: regular rate & rhythm and no murmurs and no lower extremity edema ABDOMEN:PEG tube Musculoskeletal:no cyanosis of digits and no clubbing  NEURO: alert & oriented x 3 with fluent speech, no focal motor/sensory deficits  LABORATORY DATA:  I have reviewed the data as listed CMP Latest Ref Rng 12/24/2014 12/22/2014 12/22/2014  Glucose 65 - 99 mg/dL - 80 -  BUN 6 - 20 mg/dL - 14 -  Creatinine 0.61 - 1.24 mg/dL 1.05 0.96 1.02  Sodium 135 - 145 mmol/L - 136 -  Potassium 3.5 - 5.1 mmol/L - 3.4(L) -  Chloride 101 - 111 mmol/L - 98(L) -  CO2 22 - 32 mmol/L - 27 -  Calcium 8.9 - 10.3 mg/dL - 8.8(L) -  Total Protein 6.5 - 8.1 g/dL - 6.2(L) -  Total Bilirubin 0.3 - 1.2 mg/dL - 1.1 -  Alkaline Phos 38 - 126 U/L - 55 -  AST 15 - 41 U/L - 20 -  ALT 17 - 63 U/L - 18 -    Lab Results  Component Value Date   WBC DUPLICATE REQUEST 88/32/5498   WBC 8.1 26/41/5830   HGB DUPLICATE REQUEST 94/07/6806   HGB 11.9* 81/10/3157   HCT DUPLICATE REQUEST 45/85/9292   HCT 35.9* 44/62/8638   MCV DUPLICATE REQUEST 17/71/1657   MCV 91.1 90/38/3338   PLT DUPLICATE REQUEST  32/91/9166   PLT 269 12/22/2014   NEUTROABS 5.6 12/22/2014    ASSESSMENT AND PLAN: 1. Dysphagia: due to lung cancer causing extrincic compression 2. Dyspnea: Due to progression 3. Tomorrow Radiation tattoos and prob start of XRT Tuesday 4. Met [place]: Beryle Flock starts Tuesday Prob D/C tomorrow Dr.Mohamed will see him for follow up Tue.

## 2014-12-25 NOTE — Progress Notes (Signed)
[facility] PROGRESS NOTE  Brandon Newton VQM:[id number] DOB: 1940/08/08 DOA: 12/22/2014 PCP: [name], MD  Brief narrative 74 year old recent speaking male with recurrent non-small cell lung cancer (adenocarcinoma status post lobectomy in August 2015 followed by adjuvant systemic chemotherapy, now with significant disease progression with recurrent pleural effusion requiring) catheter placement 2 weeks back. Patient presenting with  progressive dyspnea and dysphagia worsened over the past 1-2 week. Recent PET scan showing thickening around mid esophagus possible for tumor spread with extrinsic compression. Admitted to hospitalist service and [place] consulted.  Assessment/Plan: Dysphagia Secondary to extrinsic esophageal compression as seen on recent PET scan with mid esophageal thickening.  -patient underwent EGD showing focal stricture secondary to extrinsic compression in the mid esophagus and required a pediatric endoscope to traverse without resistance. The stomach and duodenum were normal. PEG tube placed in for nutrition given his esophageal stricture and planned immunotherapy. -started tube feeds and tolerating well. Will be at goal this evening.   Dyspnea with Acute hypoxic respiratory failure Patient extremely dyspneic and wheezy on presentation. Appears to be associated with progressive lung cancer with pulmonary edema. Chest x-ray shows bilateral pleural effusion possible left  lung base consolidation. Responded to IV Solu-Medrol, scheduled nebs and IV  Lasix. Added empiric Levaquin.  pleurx catheter being drained here as done  at home ( up to 300 mL removed every other day). -Dyspnea much improved. Continue when necessary Lasix. Will discontinue IV fluids.  Essential hypertension Resume home blood pressure medications.  History of a flutter As per daughter this was transient after his lobectomy. Patient is not on anticoagulation. He is currently in sinus  rhythm.    Non-small cell carcinoma of right lung, stage 4 ([place]) This is progressed with widespread metastases. Dr. Julien Nordmann has discussed options for hospice versus immunotherapy with the patient and patient inclined towards starting immunotherapy. Scheduled to be started on 12/27/2014. Dr. Julien Nordmann following.CT simulation tomorrow.   Protein calorie malnutrition Started to feeding.  Hypothyroidism Continue Synthroid  Hypokalemia . Replenished   DVT prophylaxis: Subcutaneous Lovenox Diet: Tube feeds  Code Status: Full code Family Communication: Spoke with daughter Elray Mcgregor on the phone Disposition Plan: Discharge home if tolerating advanced to feeds and after CT simulation for radiation therapy tomorrow   Consultants:  [facility]  Dr. Julien Nordmann  Procedures:  EGD with PEG placement on 12/2  Antibiotics:  Levaquin 12/1-  HPI/Subjective: Seen and examined. No overnight issues. Tolerating feeds.  Objective: Filed Vitals:   12/24/14 2028 12/25/14 0443  BP: 127/77 122/78  Pulse: 107 85  Temp: 98 F (36.7 C) 97.9 F (36.6 C)  Resp: 18 18    Intake/Output Summary (Last 24 hours) at 12/25/14 1123 Last data filed at 12/25/14 0932  Gross per 24 hour  Intake    655 ml  Output   1250 ml  Net   -595 ml   Filed Weights   12/25/14 0443  Weight: 81.92 kg (180 lb 9.6 oz)    Exam:   General:  Elderly male not in distress  HEENT: Moist oral mucosa, supple neck  Chest: Diminished bibasilar breath sounds, no added sounds, right-sided Pleurx catheter  Cardiovascular: Normal S1 and S2, no murmurs rub or gallop  GI: Soft, nondistended, nontender, PEG tube site clean, bowel sounds present  Musculoskeletal: Warm, no edema    Data Reviewed: Basic Metabolic Panel:  Recent Labs Lab 12/20/14 1102 12/22/14 1550 12/24/14 0400  NA 135* 136  --   K 3.9 3.4*  --  CL  --  98*  --   CO2 29 27  --   GLUCOSE 98 80  --   BUN 14.5 14  --   CREATININE 0.9 0.96  1.02  1.05  CALCIUM 9.6 8.8*  --    Liver Function Tests:  Recent Labs Lab 12/20/14 1102 12/22/14 1550  AST 16 20  ALT 17 18  ALKPHOS 65 55  BILITOT 0.54 1.1  PROT 6.5 6.2*  ALBUMIN 2.7* 2.9*   No results for input(s): LIPASE, AMYLASE in the last 168 hours. No results for input(s): AMMONIA in the last 168 hours. CBC:  Recent Labs Lab 12/20/14 1102 12/22/14 4562  WBC 56.3* DUPLICATE REQUEST  8.1  NEUTROABS 8.1* 5.6  HGB 89.3* DUPLICATE REQUEST  73.4*  HCT 28.7* DUPLICATE REQUEST  68.1*  MCV 15.7 DUPLICATE REQUEST  26.2  PLT 035 DUPLICATE REQUEST  597   Cardiac Enzymes: No results for input(s): CKTOTAL, CKMB, CKMBINDEX, TROPONINI in the last 168 hours. BNP (last 3 results)  Recent Labs  12/22/14 1550  BNP 82.8    ProBNP (last 3 results) No results for input(s): PROBNP in the last 8760 hours.  CBG:  Recent Labs Lab 12/24/14 1603 12/24/14 2006 12/24/14 2358 12/25/14 0432 12/25/14 0738  GLUCAP 113* 133* 139* 136* 123*    No results found for this or any previous visit (from the past 240 hour(s)).   Studies: No results found.  Scheduled Meds: . albuterol  2.5 mg Nebulization TID  . enoxaparin (LOVENOX) injection  40 mg Subcutaneous Q24H  . feeding supplement (PRO-STAT SUGAR FREE 64)  30 mL Per Tube BID  . fluticasone  2 spray Each Nare Daily  . levofloxacin (LEVAQUIN) IV  750 mg Intravenous Q24H  . levothyroxine  25 mcg Per Tube QAC breakfast  . methylPREDNISolone (SOLU-MEDROL) injection  60 mg Intravenous Q12H  . pantoprazole sodium  40 mg Per Tube BID  . tobramycin-dexamethasone  2 drop Both Eyes Q4H while awake  . triamterene-hydrochlorothiazide  1 tablet Oral q morning - 10a  . Umeclidinium-Vilanterol  1 Act Inhalation Daily   Continuous Infusions: . feeding supplement (VITAL 1.5 CAL)        Time spent: 25 minutes    Joeli Fenner, [place]  [facility] Pager [phone]. If 7PM-7AM, please contact night-coverage at [email or web address],  password [facility] 12/25/2014, 11:23 AM  LOS: 3 days

## 2014-12-25 NOTE — Progress Notes (Signed)
Pharmacy Antibiotic Time-Out Note  Brandon Newton is a 74 y.o. year-old male admitted on 12/22/2014.  The patient is currently on levaquin day #3 for PNA.  Assessment/Plan: This patient's current antibiotics will be continued without adjustments.  - continue levaquin 750 mg IV q24h - please indicate plan/LOT for abx  Temp (24hrs), Avg:97.9 F (36.6 C), Min:97.9 F (36.6 C), Max:98 F (36.7 C)   Recent Labs Lab 12/20/14 1102 12/22/14 3794  WBC 44.6* DUPLICATE REQUEST  8.1    Recent Labs Lab 12/20/14 1102 12/22/14 1550 12/24/14 0400  CREATININE 0.9 0.96  1.02 1.05   Estimated Creatinine Clearance: 62.9 mL/min (by C-G formula based on Cr of 1.05).   Antimicrobial allergies: none  Antimicrobials this admission: 12/1 >> levofloxacin  >>  Microbiology Results: No culture data  - afeb, WBC wnl, scr 1.05 (crcl~62) - 12/01 CXR: Consolidation of left lung base underlying pneumonia is not excluded.   Thank you for allowing pharmacy to be a part of this patient's care.  Lynelle Doctor PharmD 12/25/2014 10:05 AM

## 2014-12-25 NOTE — Progress Notes (Signed)
Nutrition Follow-up  INTERVENTION:   Monitor magnesium, potassium, and phosphorus daily for at least 3 days, MD to replete as needed, as pt is at risk for refeeding syndrome given low K and significant weight loss.  Continue to advance Vital 1.5 @ 40 ml/hr by 10 ml every 12 hours to goal rate of 50 ml/hr. 30 ml Prostat BID  Tube feeding regimen provides 2000 kcal (100% of needs), 111g protein and 917 ml free water.  RD to continue to monitor  NUTRITION DIAGNOSIS:   Inadequate oral intake related to inability to eat as evidenced by NPO status.  Ongoing.  GOAL:   Patient will meet greater than or equal to 90% of their needs  Progressing.  MONITOR:   Labs, Weight trends, TF tolerance, Skin, I & O's  REASON FOR ASSESSMENT:   Consult Enteral/tube feeding initiation and management  ASSESSMENT:   74 year old [place] male with history of Recurrent non-small cell lung cancer, adenocarcinoma with negative EGFR mutation and negative ALK gene translocation initially diagnosed as Stage IB (T2a, N0, M0) non-small cell lung cancer consistent with invasive adenocarcinoma with visceral pleural invasion diagnosed in August 2015.  Tube feeding has been initiated, currently Vital 1.5 @ 40 ml/hr via PEG with 30 Prostat BID. Tolerating with slower advancement. Monitor for refeeding. Re-estimated needs below and goal rate adjusted accordingly. Per oncology note, pt to start XRT possibly Tuesday.  Labs reviewed: CBGs: 123-139 Low K  Diet Order:  Diet NPO time specified  Skin:  Wound (see comment) (chest incision)  Last BM:  PTA  Height:   Ht Readings from Last 1 Encounters:  12/20/14 5' 6"  (1.676 m)    Weight:   Wt Readings from Last 1 Encounters:  12/25/14 180 lb 9.6 oz (81.92 kg)    Ideal Body Weight:  64.5 kg  BMI:  Body mass index is 29.16 kg/(m^2).  Estimated Nutritional Needs:   Kcal:  2000-2200  Protein:  110-120g  Fluid:  2L/day  EDUCATION NEEDS:   No  education needs identified at this time  Clayton Bibles, MS, RD, LDN Pager: [phone] After Hours Pager: [phone]

## 2014-12-26 ENCOUNTER — Ambulatory Visit
Admit: 2014-12-26 | Discharge: 2014-12-26 | Disposition: A | Discharge status: Home / Self Care | Payer: BLUE CROSS/BLUE SHIELD | Attending: Radiation Oncology | Admitting: Radiation Oncology

## 2014-12-26 ENCOUNTER — Encounter (HOSPITAL_COMMUNITY): Payer: Self-pay | Admitting: Gastroenterology

## 2014-12-26 DIAGNOSIS — C3491 Malignant neoplasm of unspecified part of right bronchus or lung: Secondary | ICD-10-CM | POA: Insufficient documentation

## 2014-12-26 DIAGNOSIS — R1310 Dysphagia, unspecified: Secondary | ICD-10-CM | POA: Insufficient documentation

## 2014-12-26 DIAGNOSIS — R0602 Shortness of breath: Secondary | ICD-10-CM

## 2014-12-26 DIAGNOSIS — Z510 Encounter for antineoplastic radiation therapy: Secondary | ICD-10-CM | POA: Insufficient documentation

## 2014-12-26 LAB — GLUCOSE, CAPILLARY
GLUCOSE-CAPILLARY: 107 mg/dL — AB (ref 65–99)
GLUCOSE-CAPILLARY: 116 mg/dL — AB (ref 65–99)
Glucose-Capillary: 128 mg/dL — ABNORMAL HIGH (ref 65–99)
Glucose-Capillary: 110 mg/dL — ABNORMAL HIGH (ref 65–99)
Glucose-Capillary: 138 mg/dL — ABNORMAL HIGH (ref 65–99)

## 2014-12-26 MED ORDER — PREDNISONE 20 MG PO TABS: 40.0000 mg | ORAL_TABLET | Freq: Every day | ORAL | Status: AC

## 2014-12-26 MED ORDER — VITAL 1.5 CAL PO LIQD: 1000.0000 mL | ORAL | Status: AC

## 2014-12-26 MED ORDER — FUROSEMIDE 40 MG PO TABS
40.0000 mg | ORAL_TABLET | Freq: Every day | ORAL | Status: AC | PRN
Start: 2014-12-26 — End: ?

## 2014-12-26 MED ORDER — PRO-STAT SUGAR FREE PO LIQD
30.0000 mL | Freq: Two times a day (BID) | ORAL | Status: AC
Start: 2014-12-26 — End: ?

## 2014-12-26 NOTE — Progress Notes (Signed)
  Radiation Oncology         [phone] ________________________________  Name: Kenderick Kobler MRN: [id number]  Date: 12/26/2014  DOB: 12-Apr-1940  SIMULATION AND TREATMENT PLANNING NOTE    ICD-9-CM ICD-10-CM   1. 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  Identity was confirmed.  All relevant records and images related to the planned course of therapy were reviewed.  The patient freely provided informed written consent to proceed with treatment after reviewing the details related to the planned course of therapy. The consent form was witnessed and verified by the simulation staff.  Then, the patient was set-up in a stable reproducible  supine position for radiation therapy.  CT images were obtained.  Surface markings were placed.  The CT images were loaded into the planning software.  Then the target and avoidance structures were contoured.  Treatment planning then occurred.  The radiation prescription was entered and confirmed.  Then, I designed and supervised the construction of a total of 2 medically necessary complex treatment devices.  I have requested : Isodose Plan.  I have ordered:Nutrition Consult  PLAN:  The patient will receive 30 Gy in 10 fraction.  ________________________________  Sheral Apley Tammi Klippel, M.D.  This document serves as a record of services personally performed by Tyler Pita, MD. It was created on his behalf by Derek Mound, a trained medical scribe. The creation of this record is based on the scribe's personal observations and the provider's statements to them. This document has been checked and approved by the attending provider.

## 2014-12-26 NOTE — Discharge Summary (Signed)
Physician Discharge Summary  Brandon Newton BSW:[id number] DOB: 05-07-1940 DOA: 12/22/2014  PCP: [name], MD  Admit date: 12/22/2014 Discharge date: 12/26/2014  Time spent: 35 minutes  Recommendations for Outpatient Follow-up:  1. Discharge home with home health RN and PT. 2. Patient follow-up with Dr. Julien Nordmann to start immunotherapy. He will follow-up with radiation oncology as well.    Discharge Diagnoses:  Principal Problem:   Stricture and stenosis of esophagus   Active Problems:   Dysphagia   Essential hypertension   Dyspnea   Atrial fibrillation (HCC)   Hypothyroidism due to amiodarone   Pleural effusion   Non-small cell carcinoma of right lung, stage 4 (HCC)   Loss of weight   Abnormal PET scan of mediastinum   Weight loss   Protein-calorie malnutrition (HCC)   Shortness of breath   Discharge Condition: Fair  Diet recommendation: PEG tube feeding  Filed Weights   12/25/14 0443 12/26/14 0433  Weight: 81.92 kg (180 lb 9.6 oz) 82.146 kg (181 lb 1.6 oz)    History of present illness:  Please refer to admission H&P for details, brief,74 year old recent speaking male with recurrent non-small cell lung cancer (adenocarcinoma status post lobectomy in August 2015 followed by adjuvant systemic chemotherapy, now with significant disease progression with recurrent pleural effusion requiring) catheter placement 2 weeks back. Patient presenting with progressive dyspnea and dysphagia worsened over the past 1-2 week. Recent PET scan showing thickening around mid esophagus possible for tumor spread with extrinsic compression. Admitted to hospitalist service and [place] consulted.  Hospital Course:  Dysphagia Secondary to extrinsic esophageal compression as seen on recent PET scan with mid esophageal thickening.  -patient underwent EGD showing focal stricture secondary to extrinsic compression in the mid esophagus and required a pediatric endoscope to traverse  without resistance. The stomach and duodenum were normal. PEG tube placed in for nutrition given his esophageal stricture and planned immunotherapy. -started tube feeds and tolerating well. Has reached his goal.  -Home health RN and PT arranged.  Dyspnea with Acute hypoxic respiratory failure Patient extremely dyspneic and wheezy on presentation. Appears to be associated with progressive lung cancer with pulmonary edema. Chest x-ray shows bilateral pleural effusion possible left lung base consolidation. Responded to IV Solu-Medrol, scheduled nebs and IV Lasix. Added empiric Levaquin. pleurx catheter being drained here as done at home ( up to 300 mL removed every other day). -Dyspnea resolved. He is on home o2 which will be continued. I will discharge him on oral Lasix as needed for shortness of breath or edema. -He will be discharged on 5 more days of oral prednisone. Has completed 5 days of antibiotic.  Essential hypertension Stable on home blood pressure medications.  History of a flutter As per daughter this was transient after his lobectomy. Patient is not on anticoagulation. He is currently in sinus rhythm.    Non-small cell carcinoma of right lung, stage 4 ([place]) This is progressed with widespread metastases. Dr. Julien Nordmann has discussed options for hospice versus immunotherapy with the patient and patient inclined towards starting immunotherapy. Scheduled to be started on 12/27/2014. Dr. Julien Nordmann following.patient seen by radiation oncology. Will have CT simulation done today and he can be discharged home.  Protein calorie malnutrition, severe Started to feeding with supplement. Monitor as outpatient.  Hypothyroidism Continue Synthroid  Hypokalemia . Replenished    Code Status: Full code Family Communication: Wife and daughter at bedside Disposition Plan: Skilled nursing facility   Consultants:  [facility]  Dr. Julien Nordmann  Radiation oncology  Procedures:  EGD with PEG  placement on 12/2  Antibiotics:  Levaquin 12/1-12/5  Discharge Exam: Filed Vitals:   12/25/14 2010 12/26/14 0433  BP: 116/74 117/74  Pulse: 108 10  Temp: 98.3 F (36.8 C) 97.9 F (36.6 C)  Resp: 16 16     General: Elderly male not in distress  HEENT: Moist oral mucosa, supple neck  Chest: Decreased breath sounds over right lung, no added sounds, right-sided Pleurx catheter  Cardiovascular: Normal S1 and S2, no murmurs rub or gallop  GI: Soft, nondistended, nontender, PEG tube site clean, bowel sounds present  Musculoskeletal: Warm, no edema  CNS: Alert and oriented  Discharge Instructions    Current Discharge Medication List    START taking these medications   Details  Amino Acids-Protein Hydrolys (FEEDING SUPPLEMENT, PRO-STAT SUGAR FREE 64,) LIQD Place 30 mLs into feeding tube 2 (two) times daily. Qty: 900 mL, Refills: 0    furosemide (LASIX) 40 MG tablet Take 1 tablet (40 mg total) by mouth daily as needed for edema (shortness of breath). Qty: 30 tablet, Refills: 0    Nutritional Supplements (FEEDING SUPPLEMENT, VITAL 1.5 CAL,) LIQD Place 1,000 mLs into feeding tube continuous. Qty: 1000 mL, Refills: 10    predniSONE (DELTASONE) 20 MG tablet Take 2 tablets (40 mg total) by mouth daily with breakfast. Qty: 10 tablet, Refills: 0      CONTINUE these medications which have NOT CHANGED   Details  ALPRAZolam (XANAX) 0.25 MG tablet Take 0.25 mg by mouth 3 (three) times daily as needed for anxiety.    escitalopram (LEXAPRO) 10 MG tablet Take 1 tablet (10 mg total) by mouth daily. Qty: 30 tablet, Refills: 5    fluticasone (FLONASE) 50 MCG/ACT nasal spray Place 2 sprays into both nostrils daily. Qty: 16 g, Refills: 2   Associated Diagnoses: Nasal drainage    levothyroxine (LEVOTHROID) 25 MCG tablet Take 1 tablet (25 mcg total) by mouth daily. Qty: 30 tablet, Refills: 11    Multiple Vitamin (MULTIVITAMIN) tablet Take 1 tablet by mouth daily.   Associated  Diagnoses: Adenocarcinoma of lung, stage 1, left (HCC)    ondansetron (ZOFRAN) 8 MG tablet Take 0.5 tablets (4 mg total) by mouth every 8 (eight) hours as needed for nausea or vomiting. Qty: 20 tablet, Refills: 0    tobramycin-dexamethasone (TOBRADEX) ophthalmic solution Place 2 drops into both eyes every 4 (four) hours while awake. Qty: 5 mL, Refills: 0   Associated Diagnoses: Eye irritation    traMADol (ULTRAM) 50 MG tablet Take 50 mg by mouth at bedtime.    triamterene-hydrochlorothiazide (MAXZIDE-25) 37.5-25 MG per tablet TAKE ONE TABLET BY MOUTH EVERY MORNING Qty: 30 tablet, Refills: 5    Umeclidinium-Vilanterol (ANORO ELLIPTA) 62.5-25 MCG/INH AEPB Inhale 1 Act into the lungs daily. Qty: 1 each, Refills: 11    oxyCODONE (OXY IR/ROXICODONE) 5 MG immediate release tablet Take 1-2 tablets (5-10 mg total) by mouth every 6 (six) hours as needed for moderate pain or severe pain. Qty: 30 tablet, Refills: 0       No Known Allergies Follow-up Information    Follow up with Eilleen Kempf., MD On 12/27/2014.   Specialty:  Oncology   Contact information:   [address] [place] [address] [phone]       Follow up with [name], MD. Call in 2 weeks.   Specialty:  Internal Medicine   Contact information:   [address] [place] [address] [phone]  The results of significant diagnostics from this hospitalization (including imaging, microbiology, ancillary and laboratory) are listed below for reference.    Significant Diagnostic Studies: Dg Chest 1 View  11/30/2014  CLINICAL DATA:  Status post right thoracentesis today. Postprocedural examination. EXAM: CHEST 1 VIEW COMPARISON:  CT chest and PA and lateral chest 11/29/2014. FINDINGS: Right pleural effusion is decreased after thoracentesis. No pneumothorax is identified. Very small left pleural effusion is noted. Basilar airspace disease is worse on the right. Nonunion of a remote left clavicle  fracture noted. IMPRESSION: Decreased right pleural effusion after thoracentesis. No pneumothorax or other new abnormality. Electronically Signed   By: Inge Rise M.D.   On: 11/30/2014 14:36   Dg Chest 2 View  12/05/2014  CLINICAL DATA:  Shortness of breath. Left lower lung resection 09/10/2013 EXAM: CHEST  2 VIEW COMPARISON:  12/01/2014 FINDINGS: Moderate size right pleural effusion. Mild right basilar atelectasis. Partial left lower lobectomy. Blunting of the left costophrenic angle likely postsurgical. No focal consolidation. No pneumothorax. Stable cardiomediastinal silhouette. The osseous structures are unremarkable. IMPRESSION: Moderate right pleural effusion which has increased compared with 12/01/2014. Electronically Signed   By: Kathreen Devoid   On: 12/05/2014 13:10   Dg Chest 2 View  12/01/2014  CLINICAL DATA:  Effusions, cough. EXAM: CHEST  2 VIEW COMPARISON:  11/30/2014 and 11/29/2014 FINDINGS: Lungs are somewhat hypoinflated with stable small to moderate right pleural effusion likely with associated atelectasis in the right base. Possible small amount left pleural fluid unchanged. Linear atelectasis left midlung. Cardiomediastinal silhouette and remainder of the exam is unchanged. IMPRESSION: Stable small to moderate right pleural effusion likely with associated basilar atelectasis. Possible small amount left pleural fluid. Electronically Signed   By: Marin Olp M.D.   On: 12/01/2014 13:00   Dg Chest 2 View  11/29/2014  CLINICAL DATA:  Shortness of breath. EXAM: CHEST  2 VIEW COMPARISON:  11/28/2014.  CT 06/21/2014 and 03/15/2014. FINDINGS: Mediastinum and hilar structures are normal. Bibasilar subsegmental atelectasis and/or scarring again noted. No significant interim change. Small bilateral effusions. Heart size stable. No pneumothorax. No acute osseous abnormality P IMPRESSION: Bibasilar atelectasis and/or infiltrates with small bilateral pleural effusions, right side greater than  left. No interim change from prior exam. Electronically Signed   By: [place]   On: 11/29/2014 14:06   Dg Chest 2 View  11/29/2014  CLINICAL DATA:  Shortness of breath for 3 days. History of lung cancer, hypertension. EXAM: CHEST  2 VIEW COMPARISON:  11/09/2013 FINDINGS: There is a moderate right pleural effusion with right lower lobe atelectasis or infiltrate. Small left pleural effusion with left base atelectasis. Heart is normal size. Mediastinal contours are within normal limits. 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 No acute bony abnormality. IMPRESSION: Bilateral pleural effusions, right greater than left. Minimal left base atelectasis. Right lower lobe atelectasis or consolidation. Electronically Signed   By: Rolm Baptise M.D.   On: 11/29/2014 08:12   Ct Angio Chest Pe W/cm &/or Wo Cm  11/29/2014  CLINICAL DATA:  PT W/ SHOB, MID CP AND ELEVATED D-DIMER H/O left lower LOBECTOMY DUE TO CANCER IN 2015 CONCERN FOR PE EXAM: CT ANGIOGRAPHY CHEST WITH CONTRAST TECHNIQUE: Multidetector CT imaging of the chest was performed using the standard protocol during bolus administration of intravenous contrast. Multiplanar CT image reconstructions and MIPs were obtained to evaluate the vascular anatomy. CONTRAST:  80 cc OMNIPAQUE IOHEXOL 350 MG/ML SOLN COMPARISON:  06/21/2014 FINDINGS: Heart: There significant coronary artery calcification. No pericardial  effusion. Vascular structures: Pulmonary arteries are well opacified. No acute pulmonary embolus. There is tortuosity of the thoracic aorta. Significant atherosclerotic calcification is present. No aneurysm. Mediastinum/thyroid: The visualized portion of the thyroid gland has a normal appearance. Right hilar density raises question of right hilar adenopathy this is difficult to assess given the presence of significant pleural effusion. There are mediastinal lymph nodes which have increased since prior study. AP window node now measures 1.4 cm short  axis dimension. Subcarinal lymph node is 1.0 cm short axis dimension. Small calcified lymph node is identified the subcarinal region, stable in appearance. Lungs/Airways: Large right pleural effusion is present. There is atelectasis of the right upper and lower lobes. There is volume loss the left lung base. No suspicious pulmonary nodules. Upper abdomen: Visualized portion of the pancreas an liver are normal.The adrenal glands are not fully imaged. Chest wall/osseous structures: There is a new superior endplate fracture T9, with approximately 30% loss of anterior height Review of the MIP images confirms the above findings. IMPRESSION: 1. Technically adequate exam.  There is no acute pulmonary embolus. 2. Significant right-sided pleural effusion and right lung atelectasis. Findings raise a question of malignant ascites. 3. Increased mediastinal adenopathy. Question of right hilar adenopathy. 4. Significant coronary artery disease. 5. Interval superior endplate fracture of T9 with 30% loss of anterior height. Electronically Signed   By: Nolon Nations M.D.   On: 11/29/2014 16:49   Mr Jeri Cos XB Contrast  12/19/2014  CLINICAL DATA:  74 year old male with generalized weakness and difficulty walking. History of lung cancer and hypertension. Initial encounter. EXAM: MRI HEAD WITHOUT AND WITH CONTRAST TECHNIQUE: Multiplanar, multiecho pulse sequences of the brain and surrounding structures were obtained without and with intravenous contrast. CONTRAST:  60m MULTIHANCE GADOBENATE DIMEGLUMINE 529 MG/ML IV SOLN COMPARISON:  No comparison brain MR or head CT. FINDINGS: Exam is motion degraded. No acute infarct. No intracranial hemorrhage. No intracranial mass or bony destructive lesion to suggest presence of intracranial metastatic disease. Evaluation slightly limited by the degree of motion. Mild small vessel disease type changes. Global atrophy without hydrocephalus. Major intracranial vascular structures are patent.  Partial opacification mastoid air cells without obstructing lesion of eustachian tube noted. Minimal paranasal sinus mucosal thickening. Cervical medullary junction, pituitary region, pineal region and orbital structures unremarkable. IMPRESSION: Exam is motion degraded. No acute infarct or intracranial hemorrhage. No intracranial mass or bony destructive lesion to suggest presence of intracranial metastatic disease. Evaluation slightly limited by the degree of motion. Mild small vessel disease type changes. Global atrophy without hydrocephalus. Partial opacification mastoid air cells. Minimal paranasal sinus mucosal thickening. Electronically Signed   By: SGenia DelM.D.   On: 12/19/2014 17:37   Nm Pet Image Restag (ps) Skull Base To Thigh  12/19/2014  CLINICAL DATA:  Subsequent treatment strategy for lung cancer. Restaging examination. EXAM: NUCLEAR MEDICINE PET SKULL BASE TO THIGH TECHNIQUE: 9.3 mCi F-18 FDG was injected intravenously. Full-ring PET imaging was performed from the skull base to thigh after the radiotracer. CT data was obtained and used for attenuation correction and anatomic localization. FASTING BLOOD GLUCOSE:  Value: 82 mg/dl COMPARISON:  Chest CT 11/29/2014.  PET-CT 08/31/2013. FINDINGS: NECK Nonenlarged but hypermetabolic (SUVmax = 63.5-3.2 right-sided cervical lymph nodes (level III and level IV). CHEST Diffuse hypermetabolism throughout the right pleural space (SUVmax = 14.1-16.3), which may in part relate to malignant pleural involvement, but likely largely reflects recent talc pleurodesis. Several small loculated pleural fluid collections are noted. There is also some  patchy hypermetabolism throughout the right lung with associated marked thickening of the peribronchovascular interstitium, favored to reflect lymphangitic spread of disease throughout the right lung. Status post left lower lobectomy. Small left pleural effusion has no associated hypermetabolism, layers dependently and  is simple in appearance. There are numerous borderline enlarged and enlarged markedly hypermetabolic mediastinal and bilateral lymph nodes. Specific examples include hypermetabolic left hilar lymph nodes (SUVmax = 12.8), enlarged hypermetabolic (SUVmax = 30) 1.4 cm short axis AP window lymph node and 6 mm short axis low right paratracheal lymph node that is hypermetabolic (SUVmax = 95.6), in addition to numerous others. There is also some focal thickening and hypermetabolism in the mid thoracic esophagus (SUVmax = 14.6). Heart size is normal. There is no significant pericardial fluid, thickening or pericardial calcification. There is atherosclerosis of the thoracic aorta, the great vessels of the mediastinum and the coronary arteries, including calcified atherosclerotic plaque in the left main, left anterior descending, left circumflex and right coronary arteries. Right-sided PleurX catheter in position. There is some soft tissue thickening and hypermetabolism adjacent to the catheter entry site in the lower right anterior chest wall. In addition, there is some hypermetabolism (SUVmax = 18.9) in the musculature of the lower right hemithorax, potentially at site of prior chest tube placement. ) ABDOMEN/PELVIS Hypermetabolism (SUVmax = 10.0) in the left adrenal gland without discrete nodule suspicious for a small metastatic lesion. 7 mm celiac axis lymph node (image 103 of series 4) is markedly hypermetabolic (SUVmax = 38.7), as is a nonenlarged retroperitoneal lymph node in the left para-aortic nodal station (image 117 of series 4) measuring 6 mm (SUVmax = 11.5). No abnormal hypermetabolic activity within the liver, pancreas, right adrenal gland or spleen. Extensive atherosclerosis throughout the abdominal and pelvic vasculature, including mild aneurysmal dilatation of the common iliac arteries bilaterally measuring 18 mm on the right and 17 mm on the left. No significant volume of ascites. No pneumoperitoneum. No  pathologic distention of small bowel. SKELETON No focal hypermetabolic activity to suggest skeletal metastasis. IMPRESSION: 1. Today's study demonstrates marked progression of disease with widespread bilateral mediastinal and hilar lymphadenopathy, right cervical lymphadenopathy, malignant right pleural effusion, evidence of probable lymphangitic spread of tumor throughout the right lung, left adrenal metastasis and some nonenlarged but hypermetabolic upper abdominal and retroperitoneal lymph nodes which are presumably metastatic. 2. Focal thickening and hypermetabolism of the mid esophagus. This may reflect esophagitis, but the possibility of concurrent esophageal neoplasm is not excluded. This could be further evaluated with endoscopy if of clinical concern. 3. Extensive atherosclerosis, including left main and 3 vessel coronary artery disease, in addition to bilateral common iliac artery aneurysms, as above. 4. Right-sided PleurX catheter in position appears appropriately located. Post procedural changes of recent right-sided talc pleurodesis, as above. Electronically Signed   By: Vinnie Langton M.D.   On: 12/19/2014 16:38   Dg Chest 2v Repeat Same Day  12/05/2014  CLINICAL DATA:  Status post right-sided thoracentesis. Non-small-cell carcinoma of left lung. EXAM: CHEST  2 VIEW COMPARISON:  12/05/2014 at 1322 hours FINDINGS: 1428 hours. Midline trachea. Mild cardiomegaly. Trace left pleural fluid is similar. Decrease in small right pleural effusion. No pneumothorax. Improved right base aeration. Left base scarring with volume loss. IMPRESSION: Decreased small right pleural effusion and improved adjacent right base airspace disease. No pneumothorax. Electronically Signed   By: Abigail Miyamoto M.D.   On: 12/05/2014 14:16   Dg Chest Port 1 View  12/22/2014  CLINICAL DATA:  Chronic severe shortness breath and  cough EXAM: PORTABLE CHEST 1 VIEW COMPARISON:  December 07, 2014 FINDINGS: The mediastinal contour is  normal. The heart size is enlarged. There is pulmonary edema. There are bilateral pleural effusions, left greater than right. Consolidation of left lung base is identified. There is chronic deformity of the left clavicle. IMPRESSION: Pulmonary edema. Bilateral pleural effusions, left greater than right. Consolidation of left lung base underlying pneumonia is not excluded. Electronically Signed   By: Abelardo Diesel M.D.   On: 12/22/2014 16:34   Dg Chest Port 1 View  12/07/2014  CLINICAL DATA:  preop.  Post thoracentesis. EXAM: PORTABLE CHEST 1 VIEW COMPARISON:  Radiograph 12/05/2014 FINDINGS: Normal cardiac silhouette. There are low lung volumes. There is interval increase in the RIGHT pleural effusion. No pneumothorax. Nonunion LEFT clavicle fracture. IMPRESSION: 1. Persistent and mildly increased RIGHT pleural effusion. 2. No pneumothorax. 3. Low lung volumes. 4. Nonunion LEFT clavicle fracture. Electronically Signed   By: Suzy Bouchard M.D.   On: 12/07/2014 13:19   Dg C-arm 1-60 Min-no Report  12/07/2014  CLINICAL DATA: intra op C-ARM 1-60 MINUTES Fluoroscopy was utilized by the requesting physician.  No radiographic interpretation.   US Thoracentesis Asp Pleural Space W/img Guide  11/30/2014  INDICATION: Patient with history of non small cell left lung cancer 2015; now with dyspnea and large right pleural effusion. Request made for diagnostic/therapeutic right thoracentesis. EXAM: ULTRASOUND GUIDED DIAGNOSTIC AND THERAPEUTIC RIGHT THORACENTESIS COMPARISON:  None. MEDICATIONS: None COMPLICATIONS: None immediate TECHNIQUE: Informed written consent was obtained from the patient after a discussion of the risks, benefits and alternatives to treatment. A timeout was performed prior to the initiation of the procedure. Initial ultrasound scanning demonstrates a large right pleural effusion. The lower chest was prepped and draped in the usual sterile fashion. 1% lidocaine was used for local anesthesia. An  ultrasound image was saved for documentation purposes. A 6 Fr Safe-T-Centesis catheter was introduced. The thoracentesis was performed. The catheter was removed and a dressing was applied. The patient tolerated the procedure well without immediate post procedural complication. The patient was escorted to have an upright chest radiograph. FINDINGS: A total of approximately 1.7 liters of dark, bloody fluid was removed. Requested samples were sent to the laboratory. IMPRESSION: Successful ultrasound-guided diagnostic/therapeutic right sided thoracentesis yielding 1.7 liters of pleural fluid. Read by:  Rowe Robert, [facility] Electronically Signed   By: Sandi Mariscal M.D.   On: 11/30/2014 14:35    Microbiology: No results found for this or any previous visit (from the past 240 hour(s)).   Labs: Basic Metabolic Panel:  Recent Labs Lab 12/20/14 1102 12/22/14 1550 12/24/14 0400  NA 135* 136  --   K 3.9 3.4*  --   CL  --  98*  --   CO2 29 27  --   GLUCOSE 98 80  --   BUN 14.5 14  --   CREATININE 0.9 0.96  1.02 1.05  CALCIUM 9.6 8.8*  --    Liver Function Tests:  Recent Labs Lab 12/20/14 1102 12/22/14 1550  AST 16 20  ALT 17 18  ALKPHOS 65 55  BILITOT 0.54 1.1  PROT 6.5 6.2*  ALBUMIN 2.7* 2.9*   No results for input(s): LIPASE, AMYLASE in the last 168 hours. No results for input(s): AMMONIA in the last 168 hours. CBC:  Recent Labs Lab 12/20/14 1102 12/22/14 6301  WBC 60.1* DUPLICATE REQUEST  8.1  NEUTROABS 8.1* 5.6  HGB 09.3* DUPLICATE REQUEST  23.5*  HCT 57.3* DUPLICATE REQUEST  22.0*  MCV 62.2 DUPLICATE REQUEST  63.3  PLT 354 DUPLICATE REQUEST  562   Cardiac Enzymes: No results for input(s): CKTOTAL, CKMB, CKMBINDEX, TROPONINI in the last 168 hours. BNP: BNP (last 3 results)  Recent Labs  12/22/14 1550  BNP 82.8    ProBNP (last 3 results) No results for input(s): PROBNP in the last 8760 hours.  CBG:  Recent Labs Lab 12/25/14 1653 12/25/14 2008  12/26/14 0012 12/26/14 0423 12/26/14 0744  GLUCAP 124* 76 107* 128* 110*       Signed:  Kareli Hossain  [facility] 12/26/2014, 10:44 AM

## 2014-12-26 NOTE — Progress Notes (Signed)
Brief Nutrition Note  Patient to be discharged today on continuous feedings.   Home Tube Feeding Recommendations: Provide Vital 1.5 @ 65 ml/hr infused over 18 hours.  30 ml ProMod TID Recommend free water flushes of 240 ml Q6hrs. Tube feeding regimen provides 2055 kcal, 109 g protein and 1854 ml of free water.  Formula substitution if needed: Provide Osmolite 1.5 @ 65 ml/hr over 18 hour infusion. This formula +Promod TID +free water flushes would provide 2055 kcal, 103 g protein and 1852 ml free water.  Estimated Nutritional Needs:   Kcal: 2000-2200 Protein: 110-120g Fluid: 2L/day  Please contact RD for questions.  Clayton Bibles, MS, RD, LDN Pager: [phone] After Hours Pager: [phone]

## 2014-12-26 NOTE — Care Management Note (Signed)
Case Management Note  Patient Details  Name: Brandon Newton MRN: [id number] Date of Birth: 02-Apr-1940  Subjective/Objective:         74 yo admitted with stricture and stenosis of esophagus           Action/Plan: From home with wife  Expected Discharge Date:  12/26/14               Expected Discharge Plan:  [place]  In-House Referral:     Discharge planning Services  CM Consult  Post Acute Care Choice:  Resumption of Svcs/PTA Provider Choice offered to:     DME Arranged:  Tube feeding, Tube feeding pump DME Agency:  [place]:  RN, PT [facility] Agency:  [place]  Status of Service:  In process, will continue to follow  Medicare Important Message Given:    Date Medicare IM Given:    Medicare IM give by:    Date Additional Medicare IM Given:    Additional Medicare Important Message give by:     If discussed at [place] of Stay Meetings, dates discussed:    Additional Comments: Pt to DC home with RN/PT and tube feedings. Pt is currently active with [facility] for RN services. DME tube feeding and tube feeding pump ordered and DME rep informed of referral. HH rep informed of pt discharge today. RN made aware that pt/family  would need teaching prior to discharge as [facility] RN will not come until tomorrow. Per HH rep, when feeding pump is delivered DME rep will teach on use of feeding pump. Pt and family informed and agree. Lynnell Catalan, RN 12/26/2014, 9:46 AM

## 2014-12-26 NOTE — Progress Notes (Signed)
Subjective: The patient is seen and examined today. His daughter and wife were at the bedside. He is feeling much better today. He underwent upper endoscopy that showed extrinsic compression. The patient had a PEG tube placed and he started supplemental nutrition via the PEG tube. He continues to have shortness breath but much improved compared to before his admission. He denied having any significant fever or chills. He has no nausea or vomiting.  Objective: Vital signs in last 24 hours: Temp:  [97.9 F (36.6 C)-98.3 F (36.8 C)] 97.9 F (36.6 C) (12/05 0433) Pulse Rate:  [10-108] 10 (12/05 0433) Resp:  [16] 16 (12/05 0433) BP: (116-129)/(66-74) 117/74 mmHg (12/05 0433) SpO2:  [93 %-96 %] 95 % (12/05 0433) Weight:  [181 lb 1.6 oz (82.146 kg)] 181 lb 1.6 oz (82.146 kg) (12/05 0433)  Intake/Output from previous day: 12/04 0701 - 12/05 0700 In: 1141 [P.O.:100; NG/GT:1041] Out: 2225 [Urine:2225] Intake/Output this shift: Total I/O In: -  Out: 175 [Chest Tube:175]  General appearance: alert, cooperative, fatigued and no distress Resp: diminished breath sounds RLL and dullness to percussion RLL Cardio: regular rate and rhythm, S1, S2 normal, no murmur, click, rub or gallop GI: soft, non-tender; bowel sounds normal; no masses,  no organomegaly Extremities: extremities normal, atraumatic, no cyanosis or edema  Lab Results:  No results for input(s): WBC, HGB, HCT, PLT in the last 72 hours. BMET  Recent Labs  12/24/14 0400  CREATININE 1.05    Studies/Results: No results found.  Medications: I have reviewed the patient's current medications.  Assessment/Plan: 1) metastatic non-small cell lung cancer, adenocarcinoma with positive PDL 1 expression. We will consider the patient for treatment with [facility] after discharge from the hospital. 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 Tammi Klippel in the next few  days. Continue feeding via the PEG tube. 3) right pleural effusion: Continued drainage of the pleural fluid in the Pleurx catheter. 4) disposition: The patient is expected to be discharged from the hospital today. He has a follow-up appointment scheduled at the [facility] for treatment and visit.  LOS: 4 days    Laurelle Skiver K. 12/26/2014

## 2014-12-26 NOTE — Progress Notes (Signed)
Pleurex catheter drained per MD order. 175cc of clear dark red fluid removed. Patient tolerated procedure well. Will continue to monitor.

## 2014-12-26 NOTE — Progress Notes (Signed)
Patient discharged to home,  All discharge medication and instructions reviewed and questions answered.  Patient discharged home with R foot peripheral IV in place in anticipation of immunotherapy in am at cancer center.  Cancer center staff aware of plan to use existing peripheral IV. Patient to be assisted to vehicle by wheelchair.

## 2014-12-27 ENCOUNTER — Telehealth: Payer: Self-pay | Admitting: *Deleted

## 2014-12-27 ENCOUNTER — Ambulatory Visit: Payer: BLUE CROSS/BLUE SHIELD | Admitting: Thoracic Surgery (Cardiothoracic Vascular Surgery)

## 2014-12-27 ENCOUNTER — Ambulatory Visit: Payer: BLUE CROSS/BLUE SHIELD | Admitting: Nutrition

## 2014-12-27 ENCOUNTER — Ambulatory Visit (HOSPITAL_BASED_OUTPATIENT_CLINIC_OR_DEPARTMENT_OTHER): Payer: BLUE CROSS/BLUE SHIELD

## 2014-12-27 ENCOUNTER — Other Ambulatory Visit (HOSPITAL_BASED_OUTPATIENT_CLINIC_OR_DEPARTMENT_OTHER): Payer: BLUE CROSS/BLUE SHIELD

## 2014-12-27 ENCOUNTER — Other Ambulatory Visit: Payer: Self-pay | Admitting: Medical Oncology

## 2014-12-27 ENCOUNTER — Ambulatory Visit
Admit: 2014-12-27 | Discharge: 2014-12-27 | Disposition: A | Discharge status: Home / Self Care | Payer: BLUE CROSS/BLUE SHIELD | Attending: Radiation Oncology | Admitting: Radiation Oncology

## 2014-12-27 ENCOUNTER — Encounter: Payer: Self-pay | Admitting: *Deleted

## 2014-12-27 VITALS — BP 113/20 | HR 115 | Temp 97.9°F | Resp 18

## 2014-12-27 DIAGNOSIS — C3492 Malignant neoplasm of unspecified part of left bronchus or lung: Secondary | ICD-10-CM

## 2014-12-27 DIAGNOSIS — J90 Pleural effusion, not elsewhere classified: Secondary | ICD-10-CM

## 2014-12-27 DIAGNOSIS — R1310 Dysphagia, unspecified: Secondary | ICD-10-CM | POA: Diagnosis not present

## 2014-12-27 DIAGNOSIS — E46 Unspecified protein-calorie malnutrition: Secondary | ICD-10-CM

## 2014-12-27 DIAGNOSIS — C3491 Malignant neoplasm of unspecified part of right bronchus or lung: Secondary | ICD-10-CM | POA: Diagnosis not present

## 2014-12-27 DIAGNOSIS — Z510 Encounter for antineoplastic radiation therapy: Secondary | ICD-10-CM | POA: Diagnosis not present

## 2014-12-27 DIAGNOSIS — Z5112 Encounter for antineoplastic immunotherapy: Secondary | ICD-10-CM

## 2014-12-27 DIAGNOSIS — R5382 Chronic fatigue, unspecified: Secondary | ICD-10-CM

## 2014-12-27 LAB — TSH: TSH: 13.607 m[IU]/L — AB (ref 0.320–4.118)

## 2014-12-27 LAB — COMPREHENSIVE METABOLIC PANEL
ALT: 256 U/L — AB (ref 0–55)
ANION GAP: 13 meq/L — AB (ref 3–11)
AST: 99 U/L — ABNORMAL HIGH (ref 5–34)
Albumin: 3.1 g/dL — ABNORMAL LOW (ref 3.5–5.0)
Alkaline Phosphatase: 79 U/L (ref 40–150)
BUN: 34.2 mg/dL — ABNORMAL HIGH (ref 7.0–26.0)
CALCIUM: 9.6 mg/dL (ref 8.4–10.4)
CHLORIDE: 100 meq/L (ref 98–109)
CO2: 27 meq/L (ref 22–29)
CREATININE: 0.9 mg/dL (ref 0.7–1.3)
EGFR: 84 mL/min/{1.73_m2} — AB (ref >90)
Glucose: 94 mg/dl (ref 70–140)
POTASSIUM: 3.7 meq/L (ref 3.5–5.1)
Sodium: 139 mEq/L (ref 136–145)
Total Bilirubin: 0.74 mg/dL (ref 0.20–1.20)
Total Protein: 6.7 g/dL (ref 6.4–8.3)

## 2014-12-27 LAB — CBC WITH DIFFERENTIAL/PLATELET
BASO%: 0.5 % (ref 0.0–2.0)
BASOS ABS: 0.1 10*3/uL (ref 0.0–0.1)
EOS ABS: 0.4 10*3/uL (ref 0.0–0.5)
EOS%: 3.7 % (ref 0.0–7.0)
HCT: 42.6 % (ref 38.4–49.9)
HEMOGLOBIN: 14.2 g/dL (ref 13.0–17.1)
LYMPH#: 2.2 10*3/uL (ref 0.9–3.3)
LYMPH%: 19.6 % (ref 14.0–49.0)
MCH: 30.5 pg (ref 27.2–33.4)
MCHC: 33.3 g/dL (ref 32.0–36.0)
MCV: 91.6 fL (ref 79.3–98.0)
MONO#: 1.4 10*3/uL — AB (ref 0.1–0.9)
MONO%: 12.7 % (ref 0.0–14.0)
NEUT%: 63.5 % (ref 39.0–75.0)
NEUTROS ABS: 7 10*3/uL — AB (ref 1.5–6.5)
NRBC: 0 % (ref 0–0)
PLATELETS: 323 10*3/uL (ref 140–400)
RBC: 4.65 10*6/uL (ref 4.20–5.82)
RDW: 14.3 % (ref 11.0–14.6)
WBC: 11 10*3/uL — AB (ref 4.0–10.3)

## 2014-12-27 LAB — RESPIRATORY VIRUS PANEL
Adenovirus: NEGATIVE
INFLUENZA A: NEGATIVE
INFLUENZA B 1: NEGATIVE
Metapneumovirus: NEGATIVE
PARAINFLUENZA 1 A: NEGATIVE
PARAINFLUENZA 2 A: NEGATIVE
PARAINFLUENZA 3 A: NEGATIVE
Respiratory Syncytial Virus A: NEGATIVE
Respiratory Syncytial Virus B: NEGATIVE
Rhinovirus: NEGATIVE

## 2014-12-27 MED ORDER — SODIUM CHLORIDE 0.9 % IV SOLN
200.0000 mg | Freq: Once | INTRAVENOUS | Status: AC
  Administered 2014-12-27: 200 mg via INTRAVENOUS
  Filled 2014-12-27: qty 8

## 2014-12-27 MED ORDER — SODIUM CHLORIDE 0.9 % IV SOLN
Freq: Once | INTRAVENOUS | Status: AC
  Administered 2014-12-27: 09:00:00 via INTRAVENOUS

## 2014-12-27 NOTE — Progress Notes (Signed)
Oncology Nurse Navigator Documentation  Oncology Nurse Navigator Flowsheets 12/27/2014  Navigator Encounter Type Treatment/spoke with patient's family and interpretor today.  The family was asking about information on [place].  I gave and explained.  They were thankful for the help.   Patient Visit Type Radonc  Treatment Phase Treatment  Barriers/Navigation Needs Education  Education Other  Interventions Education Method  Education Method Written  Time Spent with Patient 15

## 2014-12-27 NOTE — Progress Notes (Signed)
Per Dr. Julien Nordmann, okay to treat despite elevated ALT/AST.

## 2014-12-27 NOTE — Progress Notes (Signed)
Pt tolerated treatment without any complications.  All pt and family questions and concerns addressed.  PEG flushed while pt in in fusion room without any complications.  Pt discharged via wheelchair accompanied by family and interpreter to rad onc.

## 2014-12-27 NOTE — Patient Instructions (Signed)
Pembrolizumab injection What is this medicine? PEMBROLIZUMAB (pem broe liz ue mab) is a monoclonal antibody. It is used to treat melanoma and non-small cell lung cancer. This medicine may be used for other purposes; ask your health care provider or pharmacist if you have questions. What should I tell my health care provider before I take this medicine? They need to know if you have any of these conditions: -diabetes -immune system problems -inflammatory bowel disease -liver disease -lung or breathing disease -lupus -an unusual or allergic reaction to pembrolizumab, other medicines, foods, dyes, or preservatives -pregnant or trying to get pregnant -breast-feeding How should I use this medicine? This medicine is for infusion into a vein. It is given by a health care professional in a hospital or clinic setting. A special MedGuide will be given to you before each treatment. Be sure to read this information carefully each time. Talk to your pediatrician regarding the use of this medicine in children. Special care may be needed. Overdosage: If you think you have taken too much of this medicine contact a poison control center or emergency room at once. NOTE: This medicine is only for you. Do not share this medicine with others. What if I miss a dose? It is important not to miss your dose. Call your doctor or health care professional if you are unable to keep an appointment. What may interact with this medicine? Interactions have not been studied. Give your health care provider a list of all the medicines, herbs, non-prescription drugs, or dietary supplements you use. Also tell them if you smoke, drink alcohol, or use illegal drugs. Some items may interact with your medicine. This list may not describe all possible interactions. Give your health care provider a list of all the medicines, herbs, non-prescription drugs, or dietary supplements you use. Also tell them if you smoke, drink alcohol, or  use illegal drugs. Some items may interact with your medicine. What should I watch for while using this medicine? Your condition will be monitored carefully while you are receiving this medicine. You may need blood work done while you are taking this medicine. Do not become pregnant while taking this medicine or for 4 months after stopping it. Women should inform their doctor if they wish to become pregnant or think they might be pregnant. There is a potential for serious side effects to an unborn child. Talk to your health care professional or pharmacist for more information. Do not breast-feed an infant while taking this medicine or for 4 months after the last dose. What side effects may I notice from receiving this medicine? Side effects that you should report to your doctor or health care professional as soon as possible: -allergic reactions like skin rash, itching or hives, swelling of the face, lips, or tongue -bloody or black, tarry stools -breathing problems -change in the amount of urine -changes in vision -chest pain -chills -dark urine -dizziness or feeling faint or lightheaded -fast or irregular heartbeat -fever -flushing -hair loss -muscle pain -muscle weakness -persistent headache -signs and symptoms of high blood sugar such as dizziness; dry mouth; dry skin; fruity breath; nausea; stomach pain; increased hunger or thirst; increased urination -signs and symptoms of liver injury like dark urine, light-colored stools, loss of appetite, nausea, right upper belly pain, yellowing of the eyes or skin -stomach pain -weight loss Side effects that usually do not require medical attention (Report these to your doctor or health care professional if they continue or are bothersome.):constipation -cough -diarrhea -joint pain -  tiredness This list may not describe all possible side effects. Call your doctor for medical advice about side effects. You may report side effects to FDA at  1-800-FDA-1088. Where should I keep my medicine? This drug is given in a hospital or clinic and will not be stored at home. NOTE: This sheet is a summary. It may not cover all possible information. If you have questions about this medicine, talk to your doctor, pharmacist, or health care provider.    2016, Elsevier/Gold Standard. (2014-03-08 17:24:19)  [facility] Discharge Instructions for Patients Receiving Chemotherapy  Today you received the following chemotherapy agents Keytruda.  To help prevent nausea and vomiting after your treatment, we encourage you to take your nausea medication as directed.   If you develop nausea and vomiting that is not controlled by your nausea medication, call the clinic.   BELOW ARE SYMPTOMS THAT SHOULD BE REPORTED IMMEDIATELY:  *FEVER GREATER THAN 100.5 F  *CHILLS WITH OR WITHOUT FEVER  NAUSEA AND VOMITING THAT IS NOT CONTROLLED WITH YOUR NAUSEA MEDICATION  *UNUSUAL SHORTNESS OF BREATH  *UNUSUAL BRUISING OR BLEEDING  TENDERNESS IN MOUTH AND THROAT WITH OR WITHOUT PRESENCE OF ULCERS  *URINARY PROBLEMS  *BOWEL PROBLEMS  UNUSUAL RASH Items with * indicate a potential emergency and should be followed up as soon as possible.  Feel free to call the clinic you have any questions or concerns. The clinic phone number is [phone].  Please show the [place] at check-in to the Emergency Department and triage nurse.

## 2014-12-27 NOTE — Progress Notes (Signed)
74 year old male diagnosed with recurrent non-small cell lung cancer receiving immune immunotherapy.  He is a patient of Dr. Julien Nordmann.  Past medical history includes anxiety, hypertension, depression, hypothyroidism.  Medications include Lexapro, multivitamin, Zofran, and Keytruda.  Labs include sodium 135 and albumin 2.7 November 29.  Height: 66 inches. Weight: 181.1 pounds. Usual body weight: 185 pounds. BMI: 29.24.  Estimated nutrition needs: 2000-2200 cal, 110-125 grams protein, 2.2 L fluid.  Patient status post PEG placement secondary to extrinsic esophageal compression. Patient does not eat by mouth but drinks sips of water. Patient recently discharged from the hospital. Wife reports patient tolerating vital 1.5 at 65 mL an hour for 18 hours daily. (5 cans) Patient receiving Prostat twice a day providing additional 200 cal, 30 g protein. 240 cc free water flushes recommended every 6 hours. Tube feeding and Prostat providing  1975 cal, 110 g protein, 1865 mL free water. Patient denies nausea, vomiting, constipation or diarrhea.  Nutrition diagnosis: Inadequate oral intake related to recurrent lung cancer as evidenced by patient's requirement for feeding tube.  Intervention:  Interpreter was used to communicate nutrition information. Educated patient and wife to continue vital 1.5 - 5 cans daily with 240 cc free water 4 times a day Stressed importance of flushing feeding tube before and after medications and tube feedings. Will monitor weight and tolerance and make tube feeding changes as needed. Teach back method was used.  Questions were answered.  Contact information was given.  Monitoring, evaluation, goals: Patient will tolerate tube feedings to meet greater than 90% of estimated nutrition needs for weight maintenance.  Next visit: Tuesday, January 17, during infusion.  Wife will call me with questions if needed.  **Disclaimer: This note was dictated with voice  recognition software. Similar sounding words can inadvertently be transcribed and this note may contain transcription errors which may not have been corrected upon publication of note.**

## 2014-12-28 ENCOUNTER — Ambulatory Visit
Admit: 2014-12-28 | Discharge: 2014-12-28 | Disposition: A | Discharge status: Home / Self Care | Payer: BLUE CROSS/BLUE SHIELD | Attending: Radiation Oncology | Admitting: Radiation Oncology

## 2014-12-28 ENCOUNTER — Encounter: Payer: Self-pay | Admitting: Nurse Practitioner

## 2014-12-28 ENCOUNTER — Telehealth: Payer: Self-pay | Admitting: Internal Medicine

## 2014-12-28 ENCOUNTER — Telehealth: Payer: Self-pay | Admitting: Medical Oncology

## 2014-12-28 ENCOUNTER — Telehealth: Payer: Self-pay | Admitting: *Deleted

## 2014-12-28 ENCOUNTER — Encounter: Payer: Self-pay | Admitting: *Deleted

## 2014-12-28 DIAGNOSIS — E46 Unspecified protein-calorie malnutrition: Secondary | ICD-10-CM | POA: Insufficient documentation

## 2014-12-28 DIAGNOSIS — E860 Dehydration: Secondary | ICD-10-CM | POA: Insufficient documentation

## 2014-12-28 DIAGNOSIS — E8809 Other disorders of plasma-protein metabolism, not elsewhere classified: Secondary | ICD-10-CM | POA: Insufficient documentation

## 2014-12-28 DIAGNOSIS — R0602 Shortness of breath: Secondary | ICD-10-CM

## 2014-12-28 DIAGNOSIS — C3491 Malignant neoplasm of unspecified part of right bronchus or lung: Secondary | ICD-10-CM

## 2014-12-28 DIAGNOSIS — J9611 Chronic respiratory failure with hypoxia: Secondary | ICD-10-CM

## 2014-12-28 DIAGNOSIS — Z510 Encounter for antineoplastic radiation therapy: Secondary | ICD-10-CM | POA: Diagnosis not present

## 2014-12-28 DIAGNOSIS — C342 Malignant neoplasm of middle lobe, bronchus or lung: Secondary | ICD-10-CM

## 2014-12-28 NOTE — Telephone Encounter (Signed)
Daughter return call back completed TCM call below  Transition Care Management Follow-up Telephone Call  Date discharged? 12/26/14   How have you been since you were released from the hospital? Daughter states he is ok   Do you understand why you were in the hospital? YES   Do you understand the discharge instructions? YES   Where were you discharged to? Home   Items Reviewed:  Medications reviewed: YES  Allergies reviewed: YES  Dietary changes reviewed NO  Referrals reviewed: No referral needed   Functional Questionnaire:   Activities of Daily Living (ADLs):   She states he are independent in the following: bathing and hygiene, continence, grooming, toileting and dressing States he require assistance with the following: ambulation and feeding he actually has a feeding tube in now   Any transportation issues/concerns?: NO   Any patient concerns? NO   Confirmed importance and date/time of follow-up visits scheduled YES appt 01/04/15  Provider Appointment booked with Dr. Alain Marion  Confirmed with patient if condition begins to worsen call PCP or go to the ER.  Patient was given the office number and encouraged to call back with question or concerns.  : YES

## 2014-12-28 NOTE — Telephone Encounter (Signed)
Spoke with [place] Case Mgr in regards to pt's care Was informed that pt is participating in case mgmt with [employer] and case manager requested that if any changes or concerns are made with pt's care to contact her at [phone] ext. [phone]   Will forward this message to Dr Melvyn Novas as a FYI for patient

## 2014-12-28 NOTE — Telephone Encounter (Signed)
Tried calling pt/daughter Vicente Males) [phone] no answer can't leave msg due vm not set-up. Will also send daughter mychart msg to RTC...Johny Chess

## 2014-12-28 NOTE — Assessment & Plan Note (Signed)
Patient reports progressive difficulty swallowing; with minimal oral intake recently.  He received IV fluid rehydration yesterday; returned to the [place] today for additional IV fluid rehydration.   Most recent restaging scan revealed probable progression of disease with esophageal recurrence.  Patient with mild shortness of breath on exam; but, lungs clear bilaterally.  Patient continues on O2 per home 24/7.  Patient states that he is unable to even swallow his pills now.  He continues to feel dehydrated and has had very little nutrition.  He feels increasingly weak today.  Patient received approximately 500 ML's of normal saline IV fluid rehydration while cancer Center.  Due to worsening complaint of poor oral intake and inability to swallow even his medications-patient will be direct admitted to the hospital for further evaluation and management.    Brief history and report were given to Dr. Ree Kida hospitalist prior to the patient being transported to the floor as a directed med via wheelchair per the [place].

## 2014-12-28 NOTE — Telephone Encounter (Signed)
Have scheduled hospital fu for patient for next wed the 14th.  Patient is needing oxygen increased from 2 liters to 4 liters.  Is requesting an order to be sent to [place].  [place] [phone].

## 2014-12-28 NOTE — Telephone Encounter (Signed)
Advanced care needs resumption of care orders initiated by [facility] who will only do order for pleurix.  Will Mohamed sign off on other orders including resuming  tube feedings via PEG per hospital discharge orders- osmolyte 1.5 , at  4.8 cans a day over 18 hours /day. ,  nursing visits 2-3 x week  Then 2 x next week, then 1 x week after  And oxygen at 2-3 liters.. Family is learning how to administer feedings. Note to [facility]

## 2014-12-28 NOTE — Assessment & Plan Note (Signed)
Most recent restaging scan does reveal disease progression and probable esophageal reoccurrence.  Patient has been having increasing difficulty swallowing; and now is unable to swallow even his pills.  He has had very poor oral intake recently.  He feels dehydrated and much more weak today.  He has been coming to the [place] frequently to receive IV fluid rehydration.  Blood counts obtained on 12/20/2014 reveal a WBC of 10.7, ANC 8.1, hemoglobin 12.2, platelet count 336.  The plan was for the patient to initiate Keytruda immunotherapy on 12/27/2014.  Due to patient's progressive decline-patient will be transported to the hospital as a direct admit via the hospitalist program.  Brief history and report were given to the hospitalist prior to the patient being transported to the floor via wheelchair.  Per the cancer Center nurse.  Will also obtain a radiation oncology referral to evaluate if any radiation therapy could be considered.

## 2014-12-28 NOTE — Assessment & Plan Note (Signed)
Patient reports progressive difficulty swallowing; with minimal oral intake recently.  He received IV fluid rehydration yesterday; return to the [facility] today for additional IV fluid rehydration.  He is also scheduled receive IV fluids tomorrow as well while at the cancer center.  Most recent restaging scan revealed probable progression of disease with esophageal recurrence.  Patient with mild shortness of breath on exam; but, lungs clear bilaterally.  Patient continues on O2 per home 24-7.  Patient received approximately 500 ML's of normal saline IV fluid rehydration while cancer Center.  Due to worsening complaint of poor oral intake and inability to swallow even his medications-patient will be direct admitted to the hospital for further evaluation and management.  See further notes for details.

## 2014-12-28 NOTE — Assessment & Plan Note (Signed)
Albumen has continued to decrease and is currently 2.7.  Patient was encouraged to push protein in his diet is much as possible.

## 2014-12-28 NOTE — Progress Notes (Signed)
SYMPTOM MANAGEMENT CLINIC   HPI: Brandon Newton 74 y.o. male diagnosed with stage IV lung cancer; with adrenal metastasis.  Patient is status post lobectomy in August 2015, and chemotherapy.  Plans to initiate Keytruda immunotherapy next week.  Patient presented to the [facility] today to receive his second day in a row of IV fluid rehydration.  He complains of progressive/increased difficulty with swallowing even his medications.  He feels  increasingly weak.  He denies any recent fevers or chills.    HPI  ROS  Past Medical History  Diagnosis Date  . Anxiety   . Hypertension   . Depression   . Squamous carcinoma (HCC)     "face & scale"  . Non-small cell carcinoma of left lung, stage 1 ([place]) 08/2013    postoperative chemotherapy  . Hypothyroidism   . Pleural effusion     [facility] End[facility] 11/29/2014  . Dysrhythmia     after OR - resolved    Past Surgical History  Procedure Laterality Date  . Appendectomy    . Video assisted thoracoscopy (vats)/wedge resection Left 09/10/2013    Procedure: LEFT VIDEO ASSISTED THORACOSCOPY ,WEDGE RESECTION LEFT LOWER LOBE LUNG,LEFT LOWER LOBECTOMY, & NODE SAMPLING ;  Surgeon: Melrose Nakayama, MD;  Location: [facility];  Service: Thoracic;  Laterality: Left;  . Chest tube insertion Right 12/07/2014    Procedure: INSERTION PLEURAL DRAINAGE CATHETER;  Surgeon: Melrose Nakayama, MD;  Location: [facility];  Service: Thoracic;  Laterality: Right;  . Esophagogastroduodenoscopy N/A 12/23/2014    Procedure: ESOPHAGOGASTRODUODENOSCOPY (EGD);  Surgeon: Ladene Artist, MD;  Location: [facility];  Service: Endoscopy;  Laterality: N/A;  . Peg placement N/A 12/23/2014    Procedure: PERCUTANEOUS ENDOSCOPIC GASTROSTOMY (PEG) PLACEMENT;  Surgeon: Ladene Artist, MD;  Location: [facility];  Service: Endoscopy;  Laterality: N/A;    has INSOMNIA, PERSISTENT; Depression; RASH AND OTHER NONSPECIFIC SKIN ERUPTION; Essential hypertension; Fatigue; Contusion of hand;  Dyspnea; Thoracic aortic atherosclerosis ([place]); Status post lobectomy of lung; Cerumen impaction; Atrial fibrillation ([place]); Hypothyroidism due to amiodarone; Chest pain, atypical; Diarrhea; Pleural effusion; Non-small cell carcinoma of right lung, stage 4 ([place]); Dysphagia; Loss of weight; Weight loss; Stricture and stenosis of esophagus; Protein-calorie malnutrition ([place]); Shortness of breath; Hypoalbuminemia due to protein-calorie malnutrition ([place]); and Dehydration on his problem list.    has No Known Allergies.    Medication List       This list is accurate as of: 12/22/14  2:00 PM.  Always use your most recent med list.               escitalopram 10 MG tablet  Commonly known as:  LEXAPRO  Take 1 tablet (10 mg total) by mouth daily.     feeding supplement (PRO-STAT SUGAR FREE 64) Liqd  Place 30 mLs into feeding tube 2 (two) times daily.     feeding supplement (VITAL 1.5 CAL) Liqd  Place 1,000 mLs into feeding tube continuous.     fluticasone 50 MCG/ACT nasal spray  Commonly known as:  FLONASE  Place 2 sprays into both nostrils daily.     furosemide 40 MG tablet  Commonly known as:  LASIX  Take 1 tablet (40 mg total) by mouth daily as needed for edema (shortness of breath).     levothyroxine 25 MCG tablet  Commonly known as:  LEVOTHROID  Take 1 tablet (25 mcg total) by mouth daily.     metoprolol tartrate 25 MG tablet  Commonly known as:  LOPRESSOR  Take 1 tablet (25 mg total) by mouth 2 (two) times daily.     multivitamin tablet  Take 1 tablet by mouth daily.     ondansetron 8 MG tablet  Commonly known as:  ZOFRAN  Take 0.5 tablets (4 mg total) by mouth every 8 (eight) hours as needed for nausea or vomiting.     oxyCODONE 5 MG immediate release tablet  Commonly known as:  Oxy IR/ROXICODONE  Take 1-2 tablets (5-10 mg total) by mouth every 6 (six) hours as needed for moderate pain or severe pain.     predniSONE 20 MG tablet  Commonly known as:  DELTASONE  Take 2  tablets (40 mg total) by mouth daily with breakfast.     tobramycin-dexamethasone ophthalmic solution  Commonly known as:  TOBRADEX  Place 2 drops into both eyes every 4 (four) hours while awake.     triamterene-hydrochlorothiazide 37.5-25 MG tablet  Commonly known as:  MAXZIDE-25  TAKE ONE TABLET BY MOUTH EVERY MORNING     Umeclidinium-Vilanterol 62.5-25 MCG/INH Aepb  Commonly known as:  ANORO ELLIPTA  Inhale 1 Act into the lungs daily.         PHYSICAL EXAMINATION  Oncology Vitals 12/27/2014 12/26/2014  Height - -  Weight - -  Weight (lbs) - -  BMI (kg/m2) - -  Temp 97.9 -  Pulse 115 -  Resp 18 -  SpO2 95 96  BSA (m2) - -   BP Readings from Last 2 Encounters:  12/27/14 113/20  12/26/14 117/74    Physical Exam  Constitutional: He is oriented to person, place, and time. Vital signs are normal. He appears malnourished and dehydrated. He appears unhealthy. He appears cachectic.  HENT:  Head: Normocephalic and atraumatic.  Mouth/Throat: Oropharynx is clear and moist.  Eyes: Conjunctivae and EOM are normal. Pupils are equal, round, and reactive to light. Right eye exhibits no discharge. Left eye exhibits no discharge. No scleral icterus.  Neck: Normal range of motion. Neck supple. No JVD present. No tracheal deviation present. No thyromegaly present.  Cardiovascular: Normal rate, regular rhythm, normal heart sounds and intact distal pulses.   Pulmonary/Chest: Effort normal and breath sounds normal. No respiratory distress. He has no wheezes. He has no rales. He exhibits no tenderness.  O2 via nasal cannula.  Abdominal: Soft. Bowel sounds are normal. He exhibits no distension and no mass. There is no tenderness. There is no rebound and no guarding.  Musculoskeletal: Normal range of motion. He exhibits no edema or tenderness.  Lymphadenopathy:    He has no cervical adenopathy.  Neurological: He is alert and oriented to person, place, and time.  Skin: Skin is warm and dry. No  rash noted. No erythema. There is pallor.  Psychiatric: Affect normal.  Nursing note and vitals reviewed.   LABORATORY DATA:. Appointment on 12/20/2014  Component Date Value Ref Range Status  . WBC 12/20/2014 10.7* 4.0 - 10.3 10e3/uL Final  . NEUT# 12/20/2014 8.1* 1.5 - 6.5 10e3/uL Final  . HGB 12/20/2014 12.2* 13.0 - 17.1 g/dL Final  . HCT 12/20/2014 36.5* 38.4 - 49.9 % Final  . Platelets 12/20/2014 336  140 - 400 10e3/uL Final  . MCV 12/20/2014 90.5  79.3 - 98.0 fL Final  . MCH 12/20/2014 30.4  27.2 - 33.4 pg Final  . MCHC 12/20/2014 33.6  32.0 - 36.0 g/dL Final  . RBC 12/20/2014 4.03* 4.20 - 5.82 10e6/uL Final  . RDW 12/20/2014 14.2  11.0 - 14.6 % Final  .  lymph# 12/20/2014 1.3  0.9 - 3.3 10e3/uL Final  . MONO# 12/20/2014 0.8  0.1 - 0.9 10e3/uL Final  . Eosinophils Absolute 12/20/2014 0.3  0.0 - 0.5 10e3/uL Final  . Basophils Absolute 12/20/2014 0.1  0.0 - 0.1 10e3/uL Final  . NEUT% 12/20/2014 75.7* 39.0 - 75.0 % Final  . LYMPH% 12/20/2014 12.3* 14.0 - 49.0 % Final  . MONO% 12/20/2014 7.6  0.0 - 14.0 % Final  . EOS% 12/20/2014 3.1  0.0 - 7.0 % Final  . BASO% 12/20/2014 1.3  0.0 - 2.0 % Final  . Sodium 12/20/2014 135* 136 - 145 mEq/L Final  . Potassium 12/20/2014 3.9  3.5 - 5.1 mEq/L Final  . Chloride 12/20/2014 98  98 - 109 mEq/L Final  . CO2 12/20/2014 29  22 - 29 mEq/L Final  . Glucose 12/20/2014 98  70 - 140 mg/dl Final   Glucose reference range is for nonfasting patients. Fasting glucose reference range is 70- 100.  Marland Kitchen BUN 12/20/2014 14.5  7.0 - 26.0 mg/dL Final  . Creatinine 12/20/2014 0.9  0.7 - 1.3 mg/dL Final  . Total Bilirubin 12/20/2014 0.54  0.20 - 1.20 mg/dL Final  . Alkaline Phosphatase 12/20/2014 65  40 - 150 U/L Final  . AST 12/20/2014 16  5 - 34 U/L Final  . ALT 12/20/2014 17  0 - 55 U/L Final  . Total Protein 12/20/2014 6.5  6.4 - 8.3 g/dL Final  . Albumin 12/20/2014 2.7* 3.5 - 5.0 g/dL Final  . Calcium 12/20/2014 9.6  8.4 - 10.4 mg/dL Final  . Anion Gap  12/20/2014 8  3 - 11 mEq/L Final  . EGFR 12/20/2014 83* >90 ml/min/1.73 m2 Final   eGFR is calculated using the CKD-EPI Creatinine Equation (2009)     RADIOGRAPHIC STUDIES: No results found.  ASSESSMENT/PLAN:    Dehydration Patient reports progressive difficulty swallowing; with minimal oral intake recently.  He received IV fluid rehydration yesterday; return to the [facility] today for additional IV fluid rehydration.  He is also scheduled receive IV fluids tomorrow as well while at the cancer center.  Most recent restaging scan revealed probable progression of disease with esophageal recurrence.  Patient with mild shortness of breath on exam; but, lungs clear bilaterally.  Patient continues on O2 per home 24-7.  Patient received approximately 500 ML's of normal saline IV fluid rehydration while cancer Center.  Due to worsening complaint of poor oral intake and inability to swallow even his medications-patient will be direct admitted to the hospital for further evaluation and management.  See further notes for details.  Dysphagia Patient reports progressive difficulty swallowing; with minimal oral intake recently.  He received IV fluid rehydration yesterday; returned to the [facility] today for additional IV fluid rehydration.   Most recent restaging scan revealed probable progression of disease with esophageal recurrence.  Patient with mild shortness of breath on exam; but, lungs clear bilaterally.  Patient continues on O2 per home 24/7.  Patient states that he is unable to even swallow his pills now.  He continues to feel dehydrated and has had very little nutrition.  He feels increasingly weak today.  Patient received approximately 500 ML's of normal saline IV fluid rehydration while cancer Center.  Due to worsening complaint of poor oral intake and inability to swallow even his medications-patient will be direct admitted to the hospital for further evaluation and management.      Brief history and report were given to Dr. Ree Kida hospitalist prior to the  patient being transported to the floor as a directed med via wheelchair per the [facility].  Hypoalbuminemia due to protein-calorie malnutrition ([place]) Albumen has continued to decrease and is currently 2.7.  Patient was encouraged to push protein in his diet is much as possible.  Non-small cell carcinoma of right lung, stage 4 ([place]) Most recent restaging scan does reveal disease progression and probable esophageal reoccurrence.  Patient has been having increasing difficulty swallowing; and now is unable to swallow even his pills.  He has had very poor oral intake recently.  He feels dehydrated and much more weak today.  He has been coming to the [facility] frequently to receive IV fluid rehydration.  Blood counts obtained on 12/20/2014 reveal a WBC of 10.7, ANC 8.1, hemoglobin 12.2, platelet count 336.  The plan was for the patient to initiate Keytruda immunotherapy on 12/27/2014.  Due to patient's progressive decline-patient will be transported to the hospital as a direct admit via the hospitalist program.  Brief history and report were given to the hospitalist prior to the patient being transported to the floor via wheelchair.  Per the cancer Center nurse.  Will also obtain a radiation oncology referral to evaluate if any radiation therapy could be considered.   Translator used throughout exam.    Patient stated understanding of all instructions; and was in agreement with this plan of care. The patient knows to call the clinic with any problems, questions or concerns.   Review/collaboration with Julien Nordmann regarding all aspects of patient's visit today.   Total time spent with patient 40 minutes;  with greater than 75percent of that time spent in face to face counseling regarding patient's symptoms,  and coordination of care and follow up.  Disclaimer:This dictation was prepared with Dragon/digital  dictation along with [employer]. Any transcriptional errors that result from this process are unintentional.  Drue Second, NP 12/28/2014

## 2014-12-29 ENCOUNTER — Ambulatory Visit: Payer: BLUE CROSS/BLUE SHIELD | Admitting: Gastroenterology

## 2014-12-29 ENCOUNTER — Encounter (HOSPITAL_COMMUNITY): Payer: Self-pay

## 2014-12-29 ENCOUNTER — Ambulatory Visit
Admit: 2014-12-29 | Discharge: 2014-12-29 | Disposition: A | Discharge status: Home / Self Care | Payer: BLUE CROSS/BLUE SHIELD | Attending: Radiation Oncology | Admitting: Radiation Oncology

## 2014-12-29 DIAGNOSIS — Z510 Encounter for antineoplastic radiation therapy: Secondary | ICD-10-CM | POA: Diagnosis not present

## 2014-12-29 NOTE — Telephone Encounter (Signed)
Patty notified that [facility] wil sign orders.

## 2014-12-29 NOTE — Telephone Encounter (Signed)
Ok 4 l/min O2 Thx

## 2014-12-30 ENCOUNTER — Ambulatory Visit
Admission: RE | Admit: 2014-12-30 | Discharge: 2014-12-30 | Disposition: A | Discharge status: Home / Self Care | Payer: BLUE CROSS/BLUE SHIELD | Source: Ambulatory Visit | Attending: Radiation Oncology | Admitting: Radiation Oncology

## 2014-12-30 ENCOUNTER — Encounter: Payer: Self-pay | Admitting: Radiation Oncology

## 2014-12-30 ENCOUNTER — Ambulatory Visit
Admit: 2014-12-30 | Discharge: 2014-12-30 | Disposition: A | Discharge status: Home / Self Care | Payer: BLUE CROSS/BLUE SHIELD | Attending: Radiation Oncology | Admitting: Radiation Oncology

## 2014-12-30 VITALS — BP 108/74 | HR 57 | Resp 16 | Wt 165.9 lb

## 2014-12-30 DIAGNOSIS — C3491 Malignant neoplasm of unspecified part of right bronchus or lung: Secondary | ICD-10-CM | POA: Diagnosis present

## 2014-12-30 DIAGNOSIS — Z510 Encounter for antineoplastic radiation therapy: Secondary | ICD-10-CM | POA: Diagnosis not present

## 2014-12-30 MED ORDER — RADIAPLEXRX EX GEL
Freq: Once | CUTANEOUS | Status: AC
  Administered 2014-12-30: 17:00:00 via TOPICAL

## 2014-12-30 NOTE — Progress Notes (Addendum)
Weight and vitals stable. Denies pain. Reports SOB with mild exertion. Continuous oxygen therapy 3 liters via nasal cannula noted. No skin changes noted within treatment field. Denies a sore throat. Reports a dry cough. Reports fatigue. Patient able to sip water but, unable to eat by mouth. Patient instills nutrition via PEG tube. Oriented patient to staff and routine of the clinic. Provided patient with Radiaplex and directed upon use. Educated patient reference potential side effects and management such as fatigue, skin changes, and throat changes. Patient verbalized understanding of all reviewed. Communicated with patient via interpretor.   BP 108/74 mmHg  Pulse 57  Resp 16  Wt 165 lb 14.4 oz (75.252 kg)  SpO2 92% Wt Readings from Last 3 Encounters:  12/30/14 165 lb 14.4 oz (75.252 kg)  12/26/14 181 lb 1.6 oz (82.146 kg)  12/20/14 175 lb 3.2 oz (79.47 kg)

## 2014-12-30 NOTE — Telephone Encounter (Signed)
Daughter call requesting status on msg below. Inform daughter md ok will fax order  To [facility]...Johny Chess

## 2014-12-30 NOTE — Progress Notes (Signed)
  Radiation Oncology         [phone]  Name: Brandon Newton MRN: [id number]   Date: 12/30/2014  DOB: 05-Feb-1940   Weekly Radiation Therapy Management    ICD-9-CM ICD-10-CM   1. Non-small cell carcinoma of right lung, stage 4 (HCC) 162.9 C34.91 hyaluronate sodium (RADIAPLEXRX) gel    Current Dose: 12 Gy  Planned Dose:  30 Gy  Narrative The patient presents for routine under treatment assessment.  Weight and vitals stable. Patient is accompanied by a [place] who spoke on his behalf. Denies pain. Reports SOB with mild exertion. Continuous oxygen therapy 3 liters via nasal cannula noted. No skin changes noted within treatment field. Denies a sore throat. Reports a dry cough. Reports fatigue. Patient able to sip water but, unable to eat by mouth. Patient instills nutrition via PEG tube. He is breathing better today than yesterday, however he experienced shortness of breath while getting into the car. He is losing his breathing capacity while talking, as well. He has been drinking small amounts of water by mouth. Yesterday while he was in the restroom, he had an accident of vomiting that was unexpected and was the first time this had happened.   The patient is without complaint. Set-up films were reviewed. The chart was checked.  Physical Findings  weight is 165 lb 14.4 oz (75.252 kg). His blood pressure is 108/74 and his pulse is 57. His respiration is 16 and oxygen saturation is 92%. . Weight essentially stable.  No significant changes. In wheelchair. Nasal cannula in place. Heart has regular rhythm and rate. Lungs are clear to auscultation.   Impression The patient is tolerating radiation.  Plan Continue treatment as planned.         Sheral Apley Tammi Klippel, M.D.  This document serves as a record of services personally performed by Tyler Pita, MD. It was created on his behalf by Arlyce Harman, a trained medical scribe. The creation of this record is based on the  scribe's personal observations and the provider's statements to them. This document has been checked and approved by the attending provider.

## 2014-12-30 NOTE — Telephone Encounter (Signed)
Signed order faxed to [facility] @ [phone]

## 2015-01-02 ENCOUNTER — Ambulatory Visit
Admit: 2015-01-02 | Discharge: 2015-01-02 | Disposition: A | Discharge status: Home / Self Care | Payer: BLUE CROSS/BLUE SHIELD | Attending: Radiation Oncology | Admitting: Radiation Oncology

## 2015-01-02 ENCOUNTER — Telehealth: Payer: Self-pay | Admitting: *Deleted

## 2015-01-02 ENCOUNTER — Other Ambulatory Visit: Payer: Self-pay | Admitting: Medical Oncology

## 2015-01-02 ENCOUNTER — Telehealth: Payer: Self-pay | Admitting: Medical Oncology

## 2015-01-02 DIAGNOSIS — Z7409 Other reduced mobility: Secondary | ICD-10-CM

## 2015-01-02 DIAGNOSIS — C3491 Malignant neoplasm of unspecified part of right bronchus or lung: Secondary | ICD-10-CM

## 2015-01-02 DIAGNOSIS — H5789 Other specified disorders of eye and adnexa: Secondary | ICD-10-CM

## 2015-01-02 DIAGNOSIS — Z510 Encounter for antineoplastic radiation therapy: Secondary | ICD-10-CM | POA: Diagnosis not present

## 2015-01-02 MED ORDER — TRAMADOL HCL 50 MG PO TABS: 50.0000 mg | ORAL_TABLET | Freq: Every day | ORAL | Status: AC

## 2015-01-02 MED ORDER — TOBRAMYCIN-DEXAMETHASONE 0.3-0.1 % OP SUSP: 2.0000 [drp] | OPHTHALMIC | Status: AC

## 2015-01-02 NOTE — Telephone Encounter (Signed)
Daughter reports pt is having a hard time moving air. She does not feel that it is an emergency situation. His last does of prednisone was sat 12/31/14 and it was stopped abruptly. His pleurix is not draining much fluid . He is on lasix. She is requesting to speak to [facility] about plan of care.  She is requesting ; a hospital bed to make him more comfortable, a bypap machine , to refill the tramadol as this helps him sleep , and refill  Tobramycin eye drops . Call back [phone]. Pt is planning to come today for radiation. I lef t a message for Pura Spice , [facility] , to call me regarding BYPAP and placed order for bed. Per Julien Nordmann it is okay to order Hospital bed, refill tramadol . See PCP for eye drops and bypap. I tried daughter and phone line busy. I called Dr Alain Marion and left a message for his nurse to contact natalya re eye drop refill and bypap,

## 2015-01-02 NOTE — Progress Notes (Signed)
Tramadol called to [facility]. Natalya notified.

## 2015-01-02 NOTE — Telephone Encounter (Signed)
Diane left a vm stating pt's daughter requested a BIPAP machine and a refill of Tobramycin eye drops. They feel that should come from PCP. Please advise.

## 2015-01-02 NOTE — Telephone Encounter (Signed)
Done. Thx.

## 2015-01-02 NOTE — Telephone Encounter (Signed)
Needs BIPAP - SOB

## 2015-01-02 NOTE — Telephone Encounter (Signed)
I spoke to [facility] nurse and told her hospital bed was ordered as was tramadol and Natlaya notified. I also told her Natalya notified that bypap and tobra  eye drops need to go through PCP and that I left message w nurse at  [facility] about bypap and drops.

## 2015-01-03 ENCOUNTER — Other Ambulatory Visit: Payer: Self-pay | Admitting: *Deleted

## 2015-01-03 ENCOUNTER — Ambulatory Visit
Admission: RE | Admit: 2015-01-03 | Discharge: 2015-01-03 | Disposition: A | Discharge status: Home / Self Care | Payer: BLUE CROSS/BLUE SHIELD | Source: Ambulatory Visit | Attending: Radiation Oncology | Admitting: Radiation Oncology

## 2015-01-03 ENCOUNTER — Telehealth: Payer: Self-pay | Admitting: Internal Medicine

## 2015-01-03 DIAGNOSIS — Z510 Encounter for antineoplastic radiation therapy: Secondary | ICD-10-CM | POA: Diagnosis not present

## 2015-01-03 NOTE — Telephone Encounter (Signed)
Please call Patty from [facility] at [phone]. She says its very urgent. She was unable to tell me why because she had to take a call.

## 2015-01-03 NOTE — Telephone Encounter (Signed)
Rx was faxed to [facility].

## 2015-01-03 NOTE — Telephone Encounter (Signed)
I spoke to [place]-  She states a setting needs to be prescribed.  Rx updated by PCP and re faxed to [facility] @ [phone].

## 2015-01-04 ENCOUNTER — Ambulatory Visit
Admit: 2015-01-04 | Discharge: 2015-01-04 | Disposition: A | Discharge status: Home / Self Care | Payer: BLUE CROSS/BLUE SHIELD | Attending: Radiation Oncology | Admitting: Radiation Oncology

## 2015-01-04 ENCOUNTER — Inpatient Hospital Stay: Payer: BLUE CROSS/BLUE SHIELD | Admitting: Internal Medicine

## 2015-01-04 DIAGNOSIS — Z510 Encounter for antineoplastic radiation therapy: Secondary | ICD-10-CM | POA: Diagnosis not present

## 2015-01-05 ENCOUNTER — Ambulatory Visit: Payer: BLUE CROSS/BLUE SHIELD | Admitting: Internal Medicine

## 2015-01-05 ENCOUNTER — Ambulatory Visit
Admit: 2015-01-05 | Discharge: 2015-01-05 | Disposition: A | Discharge status: Home / Self Care | Payer: BLUE CROSS/BLUE SHIELD | Attending: Radiation Oncology | Admitting: Radiation Oncology

## 2015-01-05 DIAGNOSIS — Z510 Encounter for antineoplastic radiation therapy: Secondary | ICD-10-CM | POA: Diagnosis not present

## 2015-01-06 ENCOUNTER — Ambulatory Visit
Admit: 2015-01-06 | Discharge: 2015-01-06 | Disposition: A | Discharge status: Home / Self Care | Payer: BLUE CROSS/BLUE SHIELD | Attending: Radiation Oncology | Admitting: Radiation Oncology

## 2015-01-06 ENCOUNTER — Encounter: Payer: Self-pay | Admitting: Radiation Oncology

## 2015-01-06 ENCOUNTER — Telehealth: Payer: Self-pay | Admitting: *Deleted

## 2015-01-06 ENCOUNTER — Ambulatory Visit
Admission: RE | Admit: 2015-01-06 | Discharge: 2015-01-06 | Disposition: A | Discharge status: Home / Self Care | Payer: BLUE CROSS/BLUE SHIELD | Source: Ambulatory Visit | Attending: Radiation Oncology | Admitting: Radiation Oncology

## 2015-01-06 VITALS — BP 116/89 | HR 123 | Temp 97.0°F | Resp 20

## 2015-01-06 DIAGNOSIS — C3491 Malignant neoplasm of unspecified part of right bronchus or lung: Secondary | ICD-10-CM

## 2015-01-06 DIAGNOSIS — Z510 Encounter for antineoplastic radiation therapy: Secondary | ICD-10-CM | POA: Diagnosis not present

## 2015-01-06 MED ORDER — MORPHINE SULFATE 15 MG PO TABS: 15.0000 mg | ORAL_TABLET | ORAL | Status: AC | PRN

## 2015-01-06 NOTE — Telephone Encounter (Signed)
Received  Message from daughter [name] requesting to talk to Dr. Julien Nordmann.  Spoke with daughter and was informed that she would like to personally talk with Dr. Julien Nordmann either by phone or in person to discuss pt's condition.  Per daughter, pt is declining rapidly.  Has increased shortness of breath, still tolerating tube feeds ok.   Informed daughter that Dr. Julien Nordmann will be back in office on 12/19 , and message will be left on md's desk for review. Natalya also stated she would like for pt to have Morphine to help pt breathe better between now until she talks to Dr. Julien Nordmann. Message sent to on call md Dr. Irene Limbo and desk nurse. Natalya's  Phone   [phone].

## 2015-01-06 NOTE — Progress Notes (Signed)
  Radiation Oncology         [phone]  Name: Brandon Newton MRN: [id number]   Date: 01/06/2015  DOB: October 14, 1940   Weekly Radiation Therapy Management    ICD-9-CM ICD-10-CM   1. Non-small cell carcinoma of right lung, stage 4 (HCC) 162.9 C34.91 morphine (MSIR) 15 MG tablet    Current Dose: 27 Gy  Planned Dose:  30 Gy  Narrative The patient presents for routine under treatment assessment.  Heart rate elevated. Reports he doesn't feel well today. Reports increased dyspnea and shortness of breath. Patient only consumes small sips of water by mouth. Patient instills nutrition via PEG tube. Oxygen therapy six liters via nasal cannula noted. Received Keytruda (first dose) three weeks ago. Marked decline of patient's status noted. Productive cough with thick clear sputum.   Set-up films were reviewed. The chart was checked.  Physical Findings  axillary temperature is 97 F (36.1 C). His blood pressure is 116/89 and his pulse is 123. His respiration is 20 and oxygen saturation is 95%. . Weight essentially stable.  No significant changes. In wheelchair. Nasal cannula in place. Heart has regular rhythm and rate. Lungs are clear to auscultation.   Impression The patient is tolerating radiation.  Plan Continue treatment as planned. He has another infusion of Keytruda on 01/17/2015. We discussed palliative therapy and Hospice. I prescribed him morphine for his dyspnea.         Sheral Apley Tammi Klippel, M.D.  This document serves as a record of services personally performed by Tyler Pita, MD. It was created on his behalf by Lendon Collar, a trained medical scribe. The creation of this record is based on the scribe's personal observations and the provider's statements to them. This document has been checked and approved by the attending provider.

## 2015-01-06 NOTE — Progress Notes (Signed)
Heart rate elevated. Reports he doesn't feel well today. Reports increased dyspnea and shortness of breath. Patient only consumes small sips of water by mouth. Patient instills nutrition via PEG tube. Oxygen therapy six liters via nasal cannula noted. Received Keytruda (first dose) three weeks ago. Marked decline of patient's status noted. Productive cough with thick clear sputum.   BP 116/89 mmHg  Pulse 123  Temp(Src) 97 F (36.1 C) (Axillary)  Resp 20  Wt   SpO2 95% Wt Readings from Last 3 Encounters:  12/30/14 165 lb 14.4 oz (75.252 kg)  12/26/14 181 lb 1.6 oz (82.146 kg)  12/20/14 175 lb 3.2 oz (79.47 kg)

## 2015-01-06 NOTE — Telephone Encounter (Signed)
Noted that pt has radiation appt today at 220 pm and with Dr. Tammi Klippel at 235 pm.  Dr. Irene Limbo notified of daughter's concern and request for Morphine for pt.  Was instructed by Dr. Irene Limbo to ask Dr. Tammi Klippel to evaluate pt at visit today.  Spoke with Sam, RN for Dr. Tammi Klippel and gave her above info.  Sam stated she would relay message to Dr. Tammi Klippel.

## 2015-01-09 ENCOUNTER — Ambulatory Visit
Admit: 2015-01-09 | Discharge: 2015-01-09 | Disposition: A | Discharge status: Home / Self Care | Payer: BLUE CROSS/BLUE SHIELD | Attending: Radiation Oncology | Admitting: Radiation Oncology

## 2015-01-09 ENCOUNTER — Telehealth: Payer: Self-pay | Admitting: Internal Medicine

## 2015-01-09 DIAGNOSIS — Z510 Encounter for antineoplastic radiation therapy: Secondary | ICD-10-CM | POA: Diagnosis not present

## 2015-01-09 NOTE — Telephone Encounter (Signed)
Advise Brandon Newton of Dr. Camila Li response which is '[place]'. Forward this to [place].

## 2015-01-09 NOTE — Telephone Encounter (Signed)
Pattie nurse called from [facility] request order to insert catheter due to urinary retention. Please give her a call back ASAP, she is on her way to see him right now.   Phone # [phone]

## 2015-01-17 ENCOUNTER — Ambulatory Visit: Payer: BLUE CROSS/BLUE SHIELD | Admitting: Physician Assistant

## 2015-01-17 ENCOUNTER — Telehealth: Payer: Self-pay | Admitting: *Deleted

## 2015-01-17 ENCOUNTER — Other Ambulatory Visit: Payer: Self-pay | Admitting: Physician Assistant

## 2015-01-17 ENCOUNTER — Other Ambulatory Visit: Payer: BLUE CROSS/BLUE SHIELD

## 2015-01-17 ENCOUNTER — Ambulatory Visit: Payer: BLUE CROSS/BLUE SHIELD

## 2015-01-17 ENCOUNTER — Telehealth: Payer: Self-pay

## 2015-01-17 NOTE — Telephone Encounter (Signed)
Message from family member, pt passed on 02/08/23. POF to scheduling. Attempt to reach family member at #left on vm, unable to leave message as there was no VM set up.

## 2015-01-17 NOTE — Telephone Encounter (Signed)
Called Pt. To inquire about appt. missed today for treatment. Spoke with daughter of Pt., She stated that Pt. Passed away last night.

## 2015-01-18 NOTE — Telephone Encounter (Signed)
"  I've been trying to call for two days to report Brandon Newton is deceased.  Could you let radiation doctor know as well so all appointments are cancelled."  Will notify [facility] and staff but it appears some [facility] staff is aware.

## 2015-01-22 DEATH — deceased

## 2015-01-25 NOTE — Progress Notes (Signed)
  Radiation Oncology         [phone] ________________________________  Name: Brandon Newton MRN: [id number]  Date: 01/06/2015  DOB: March 10, 1940  End of Treatment Note  Diagnosis:   75 yo gentleman with a diffuse right hemithoracic recurrence of non small cell carcinoma, now with dysphagia     Indication for treatment:  Palliation of dysphagia       Radiation treatment dates:   12/27/14-01/06/15  Site/dose:   The subcarinal adenopathy obstructing the esophagus was treated to 30 Gy in 10 fractions of 3 Gy  Beams/energy:   A three field set-up was used with AP, PA, and right lateral conformal fields with 6 and 10 MV X-rays.  Narrative: The patient tolerated radiation treatment relatively well, but, his overall condition, particularly from a pulmonary standpoint remained tenuous and worsened.  Plan: The patient has completed radiation treatment. The patient will return to radiation oncology on an as needed basis. ________________________________  Sheral Apley Tammi Klippel, M.D.

## 2015-02-07 ENCOUNTER — Other Ambulatory Visit: Payer: BLUE CROSS/BLUE SHIELD

## 2015-02-07 ENCOUNTER — Ambulatory Visit: Payer: BLUE CROSS/BLUE SHIELD | Admitting: Internal Medicine

## 2015-02-07 ENCOUNTER — Encounter: Payer: BLUE CROSS/BLUE SHIELD | Admitting: Nutrition

## 2015-02-07 ENCOUNTER — Ambulatory Visit: Payer: BLUE CROSS/BLUE SHIELD

## 2015-02-09 ENCOUNTER — Ambulatory Visit
Admission: RE | Admit: 2015-02-09 | Payer: BLUE CROSS/BLUE SHIELD | Source: Ambulatory Visit | Admitting: Radiation Oncology

## 2015-07-11 ENCOUNTER — Other Ambulatory Visit: Payer: Self-pay | Admitting: Nurse Practitioner

## 2015-08-01 ENCOUNTER — Ambulatory Visit: Payer: BLUE CROSS/BLUE SHIELD | Admitting: Thoracic Surgery (Cardiothoracic Vascular Surgery)

## 2017-02-12 IMAGING — DX DG CHEST 2V
2 series · 2 of 2 positions shown · non-contrast
Comparison: 11/09/2013

CLINICAL DATA: Shortness of breath for 3 days. History of lung
cancer, hypertension.

EXAM:
CHEST  2 VIEW

[chest pa]
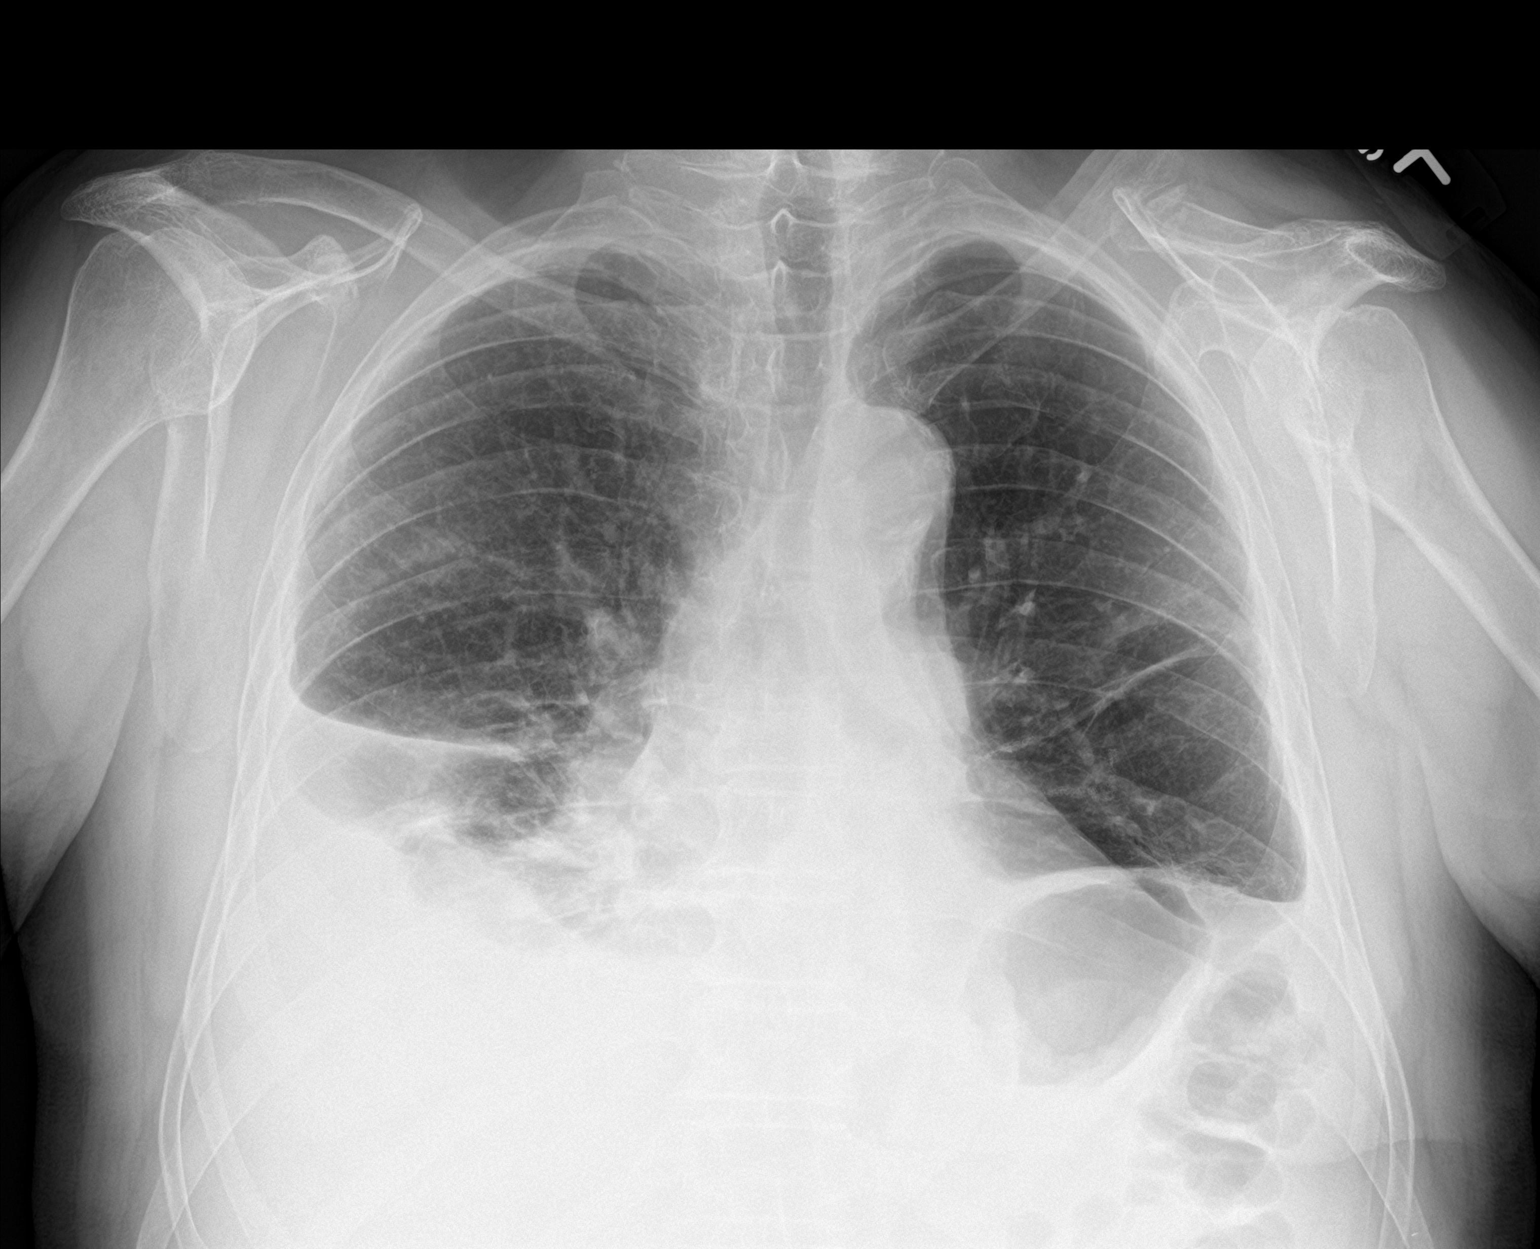

[chest lat]
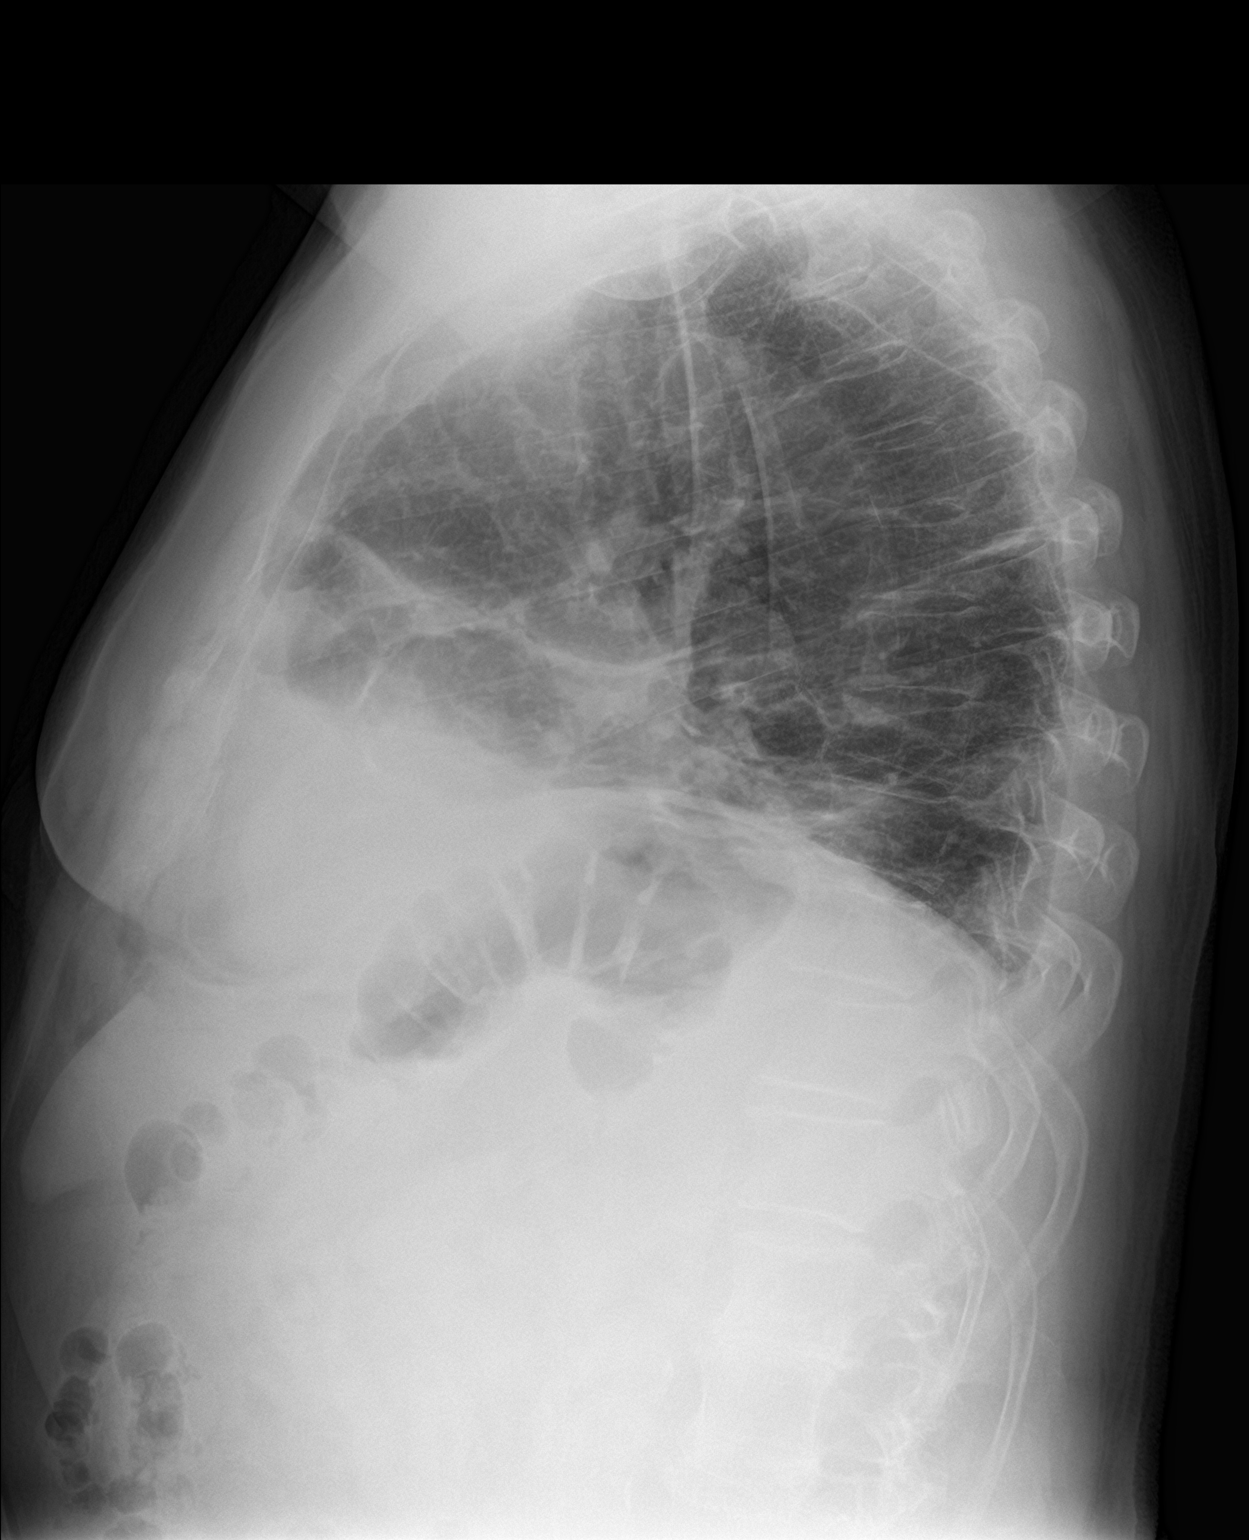

[2 of 2 positions shown; findings below may reference images not displayed]

FINDINGS: There is a moderate right pleural effusion with right lower lobe
atelectasis or infiltrate. Small left pleural effusion with left
base atelectasis. Heart is normal size. Mediastinal contours are
within normal limits.

Old left clavicle fracture with nonunion, stable. No acute bony
abnormality.
IMPRESSION: Bilateral pleural effusions, right greater than left. Minimal left
base atelectasis. Right lower lobe atelectasis or consolidation.

## 2017-02-13 IMAGING — CT CT ANGIO CHEST
2 of 9 series · 17 of 46 positions shown · IV contrast (APPLIED)
Comparison: 06/21/2014

CLINICAL DATA: PT W/ SHOB, MID CP AND ELEVATED D-DIMER H/O left
lower LOBECTOMY DUE TO CANCER IN 3744 CONCERN FOR PE

EXAM:
CT ANGIOGRAPHY CHEST WITH CONTRAST
TECHNIQUE: Multidetector CT imaging of the chest was performed using the
standard protocol during bolus administration of intravenous
contrast. Multiplanar CT image reconstructions and MIPs were
obtained to evaluate the vascular anatomy.
CONTRAST:  80 cc OMNIPAQUE IOHEXOL 350 MG/ML SOLN

[Series 5: thins · axial · 0.73mm/px · z∈[+1156,+1391]mm · 14 of 265 slices shown]
[im 15/265  lung]
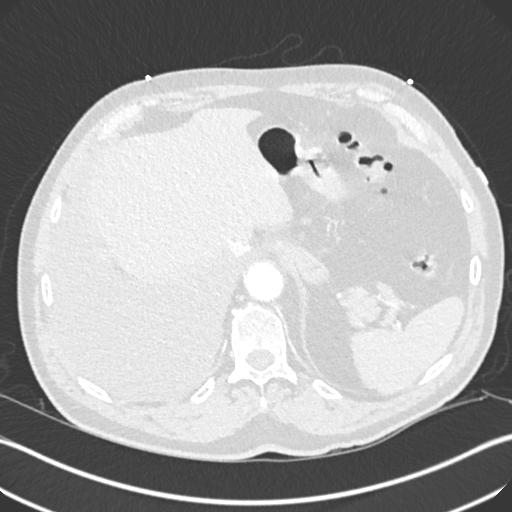
[im 30/265  soft-tissue]
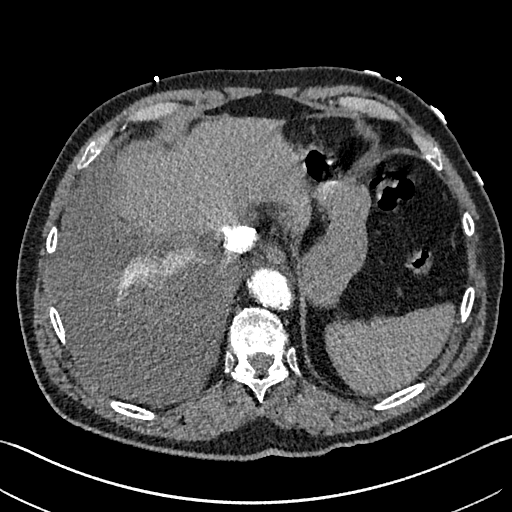
[im 59/265  lung]
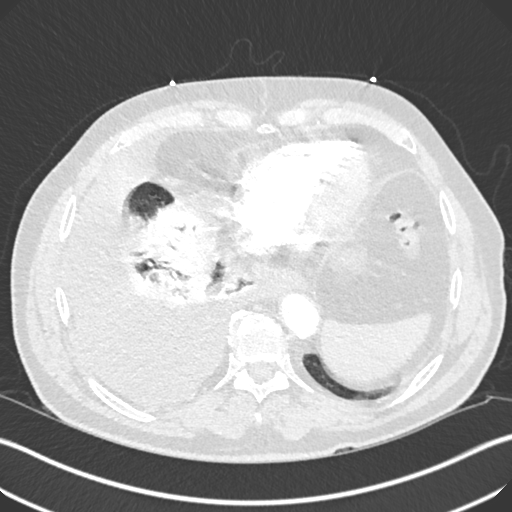
[im 74/265  soft-tissue]
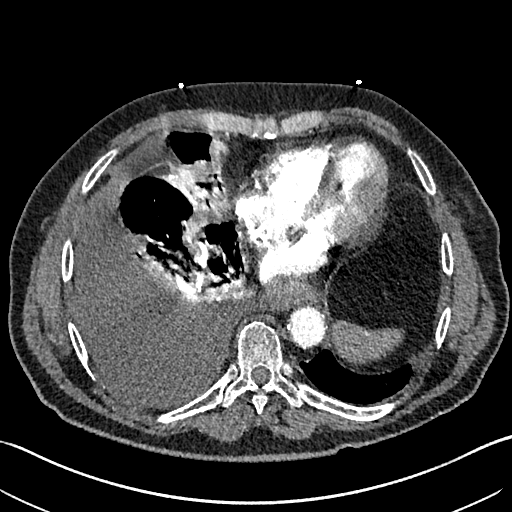
[im 89/265  lung]
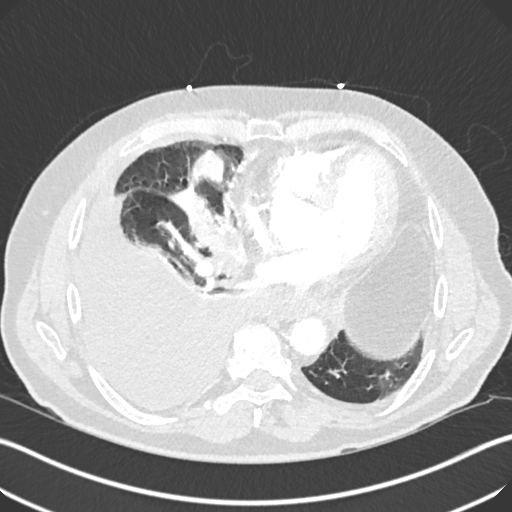
[im 103/265  soft-tissue]
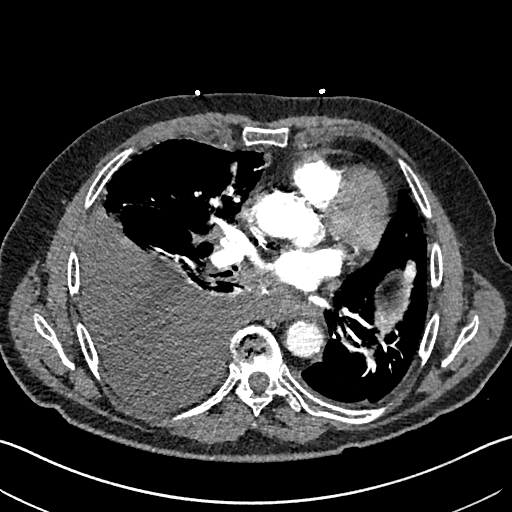
[im 118/265  lung]
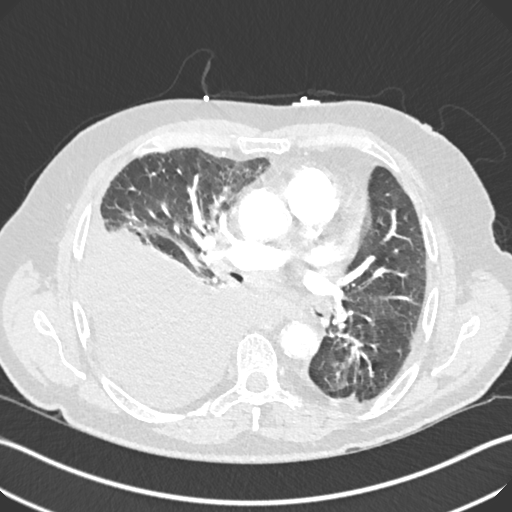
[im 147/265  soft-tissue]
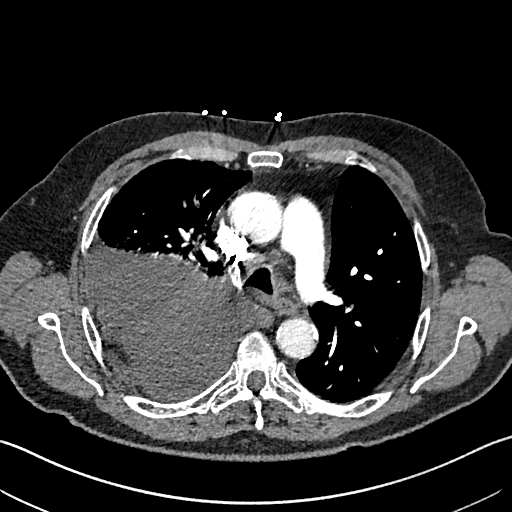
[im 162/265  lung]
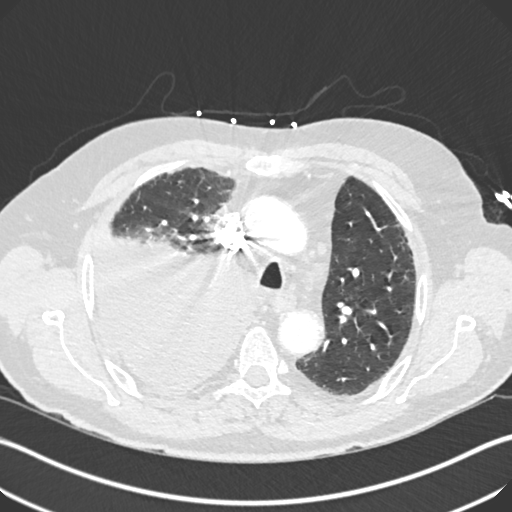
[im 177/265  soft-tissue]
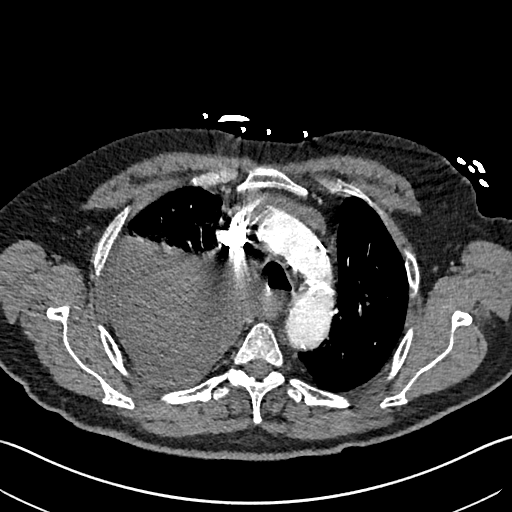
[im 191/265  lung]
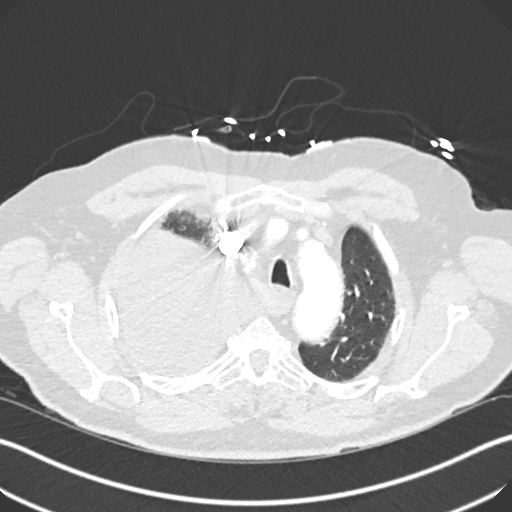
[im 206/265  soft-tissue]
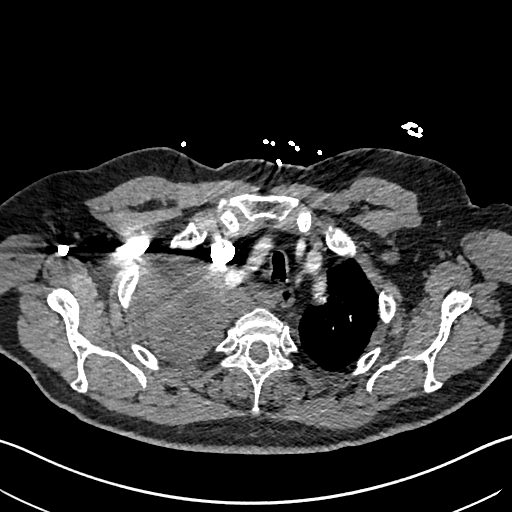
[im 235/265  lung]
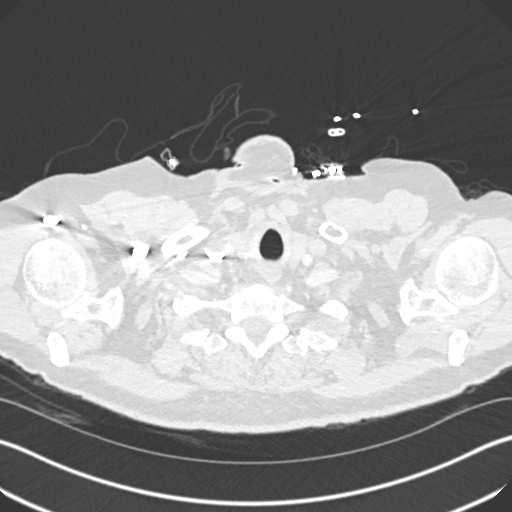
[im 250/265  soft-tissue]
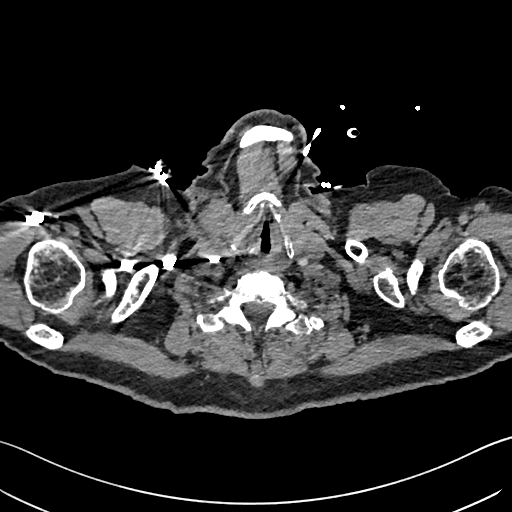

[Series 7: coronal mpr · coronal · 0.54mm/px · 3 of 126 slices shown]
[im 32/126  soft-tissue]
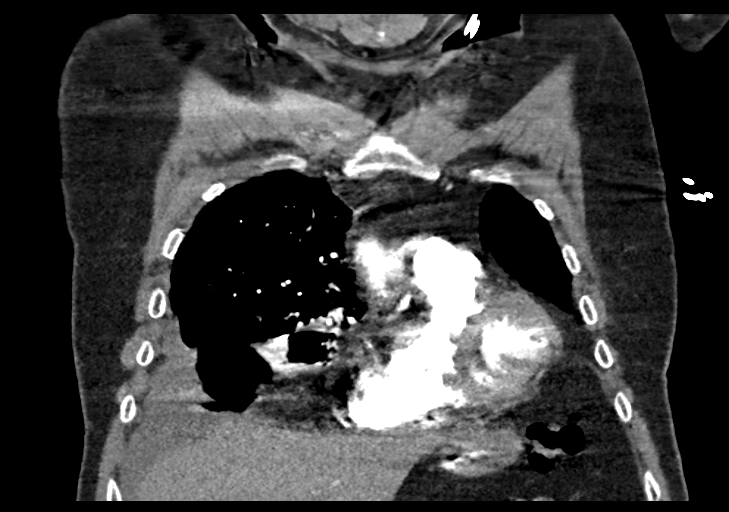
[im 63/126  soft-tissue]
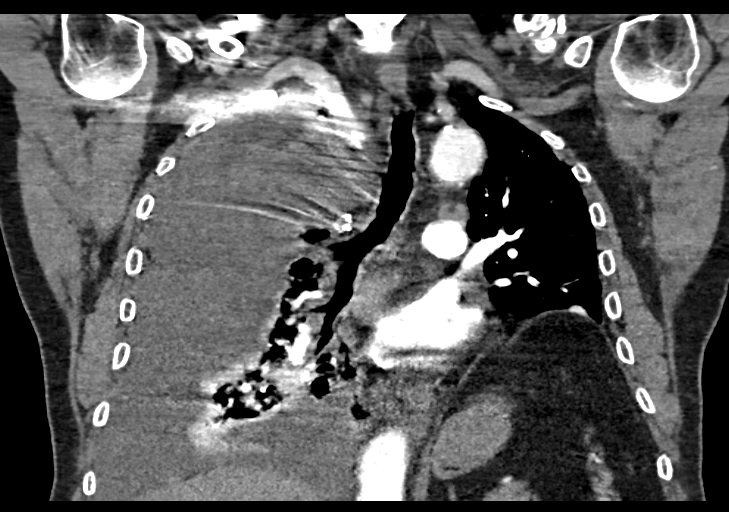
[im 94/126  soft-tissue]
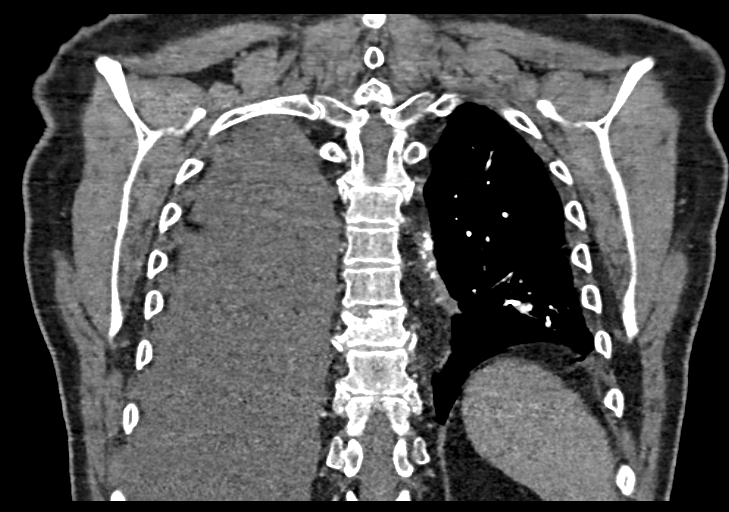

[17 of 46 positions shown; findings below may reference images not displayed]

FINDINGS: Heart: There significant coronary artery calcification. No
pericardial effusion.

Vascular structures: Pulmonary arteries are well opacified. No acute
pulmonary embolus. There is tortuosity of the thoracic aorta.
Significant atherosclerotic calcification is present. No aneurysm.

Mediastinum/thyroid: The visualized portion of the thyroid gland has
a normal appearance. Right hilar density raises question of right
hilar adenopathy this is difficult to assess given the presence of
significant pleural effusion. There are mediastinal lymph nodes
which have increased since prior study. AP window node now measures
1.4 cm short axis dimension. Subcarinal lymph node is 1.0 cm short
axis dimension. Small calcified lymph node is identified the
subcarinal region, stable in appearance.

Lungs/Airways: Large right pleural effusion is present. There is
atelectasis of the right upper and lower lobes. There is volume loss
the left lung base. No suspicious pulmonary nodules.

Upper abdomen: Visualized portion of the pancreas an liver are
normal.The adrenal glands are not fully imaged.

Chest wall/osseous structures: There is a new superior endplate
fracture T9, with approximately 30% loss of anterior height

Review of the MIP images confirms the above findings.
IMPRESSION: 1. Technically adequate exam.  There is no acute pulmonary embolus.
2. Significant right-sided pleural effusion and right lung
atelectasis. Findings raise a question of malignant ascites.
3. Increased mediastinal adenopathy. Question of right hilar
adenopathy.
4. Significant coronary artery disease.
5. Interval superior endplate fracture of T9 with 30% loss of
anterior height.
